# Patient Record
Sex: Male | Born: 1960 | Race: White | Hispanic: No | Marital: Married | Smoking: Former smoker
Health system: Southern US, Community
[De-identification: ages and names within clinical notes are randomized; demographics above are authoritative.]

---

## 1994-02-13 HISTORY — PX: VASECTOMY: SHX75

## 1998-12-16 ENCOUNTER — Encounter: Admission: RE | Admit: 1998-12-16 | Discharge: 1998-12-16 | Payer: Self-pay | Admitting: Family Medicine

## 1998-12-16 ENCOUNTER — Encounter: Payer: Self-pay | Admitting: Family Medicine

## 1999-03-12 ENCOUNTER — Emergency Department (HOSPITAL_COMMUNITY): Admission: EM | Admit: 1999-03-12 | Discharge: 1999-03-12 | Payer: Self-pay | Admitting: Internal Medicine

## 2002-06-23 ENCOUNTER — Ambulatory Visit (HOSPITAL_BASED_OUTPATIENT_CLINIC_OR_DEPARTMENT_OTHER): Admission: RE | Admit: 2002-06-23 | Discharge: 2002-06-23 | Payer: Self-pay | Admitting: Otolaryngology

## 2013-02-13 HISTORY — PX: COLONOSCOPY: SHX174

## 2014-02-13 DIAGNOSIS — K5792 Diverticulitis of intestine, part unspecified, without perforation or abscess without bleeding: Secondary | ICD-10-CM

## 2014-02-13 HISTORY — DX: Diverticulitis of intestine, part unspecified, without perforation or abscess without bleeding: K57.92

## 2019-05-23 ENCOUNTER — Other Ambulatory Visit: Payer: Self-pay | Admitting: Family Medicine

## 2019-05-23 DIAGNOSIS — R972 Elevated prostate specific antigen [PSA]: Secondary | ICD-10-CM

## 2019-05-23 DIAGNOSIS — N5082 Scrotal pain: Secondary | ICD-10-CM

## 2019-05-23 DIAGNOSIS — R3911 Hesitancy of micturition: Secondary | ICD-10-CM

## 2019-05-23 NOTE — Progress Notes (Signed)
Several year h/o R groin bulge and testicular enlargement. Scrotum currently firm and round and approximately 10cm w/ continuation of fullness up the spermatic cord. Pt w/ h/o vasectomy in 1998. Pt also w/ urinary hesitancy and incomplete bladder emptying.   Shelly Flatten, MD Family Medicine 05/23/2019, 11:23 AM

## 2019-06-19 ENCOUNTER — Ambulatory Visit
Admission: RE | Admit: 2019-06-19 | Discharge: 2019-06-19 | Disposition: A | Discharge status: Home / Self Care | Payer: BC Managed Care – PPO | Source: Ambulatory Visit | Attending: Family Medicine | Admitting: Family Medicine

## 2019-06-19 ENCOUNTER — Other Ambulatory Visit: Payer: Self-pay

## 2019-06-19 DIAGNOSIS — R3911 Hesitancy of micturition: Secondary | ICD-10-CM

## 2019-06-19 DIAGNOSIS — N5082 Scrotal pain: Secondary | ICD-10-CM

## 2019-06-19 DIAGNOSIS — R972 Elevated prostate specific antigen [PSA]: Secondary | ICD-10-CM

## 2019-06-19 MED ORDER — GADOBENATE DIMEGLUMINE 529 MG/ML IV SOLN: 20.0000 mL | Freq: Once | INTRAVENOUS | Status: DC | PRN

## 2019-06-19 MED ORDER — GADOBENATE DIMEGLUMINE 529 MG/ML IV SOLN
20.0000 mL | Freq: Once | INTRAVENOUS | Status: AC | PRN
  Administered 2019-06-19: 17:00:00 20 mL via INTRAVENOUS

## 2019-07-29 ENCOUNTER — Other Ambulatory Visit: Payer: Self-pay | Admitting: Family Medicine

## 2019-07-29 DIAGNOSIS — Z87891 Personal history of nicotine dependence: Secondary | ICD-10-CM

## 2019-07-29 DIAGNOSIS — Z122 Encounter for screening for malignant neoplasm of respiratory organs: Secondary | ICD-10-CM

## 2019-08-08 ENCOUNTER — Other Ambulatory Visit: Payer: Self-pay | Admitting: Urology

## 2019-08-21 NOTE — Patient Instructions (Addendum)
DUE TO COVID-19 ONLY ONE VISITOR IS ALLOWED TO COME WITH YOU AND STAY IN THE WAITING ROOM ONLY DURING PRE OP AND PROCEDURE DAY OF SURGERY. THE 1 VISITOR MAY VISIT WITH YOU AFTER SURGERY IN YOUR PRIVATE ROOM DURING VISITING HOURS ONLY!  YOU NEED TO HAVE A COVID 19 TEST ON_7/22______ @_9 :25______, THIS TEST MUST BE DONE BEFORE SURGERY, COME  [address], [place] , .  [facility]) ONCE YOUR COVID TEST IS COMPLETED, PLEASE BEGIN THE QUARANTINE INSTRUCTIONS AS OUTLINED IN YOUR HANDOUT.                Timothy Leblanc    Your procedure is scheduled on: 09/08/19   Report to [facility] Main  Entrance   Report to admitting at  9:00 AM     Call this number if you have problems the morning of surgery [phone]    Remember: Do not eat food or drink liquids after Midnight.   BRUSH YOUR TEETH MORNING OF SURGERY AND RINSE YOUR MOUTH OUT, NO CHEWING GUM CANDY OR MINTS.     Take these medicines the morning of surgery with A SIP OF WATER: Tamsulosin                                 You may not have any metal on your body including              piercings  Do not wear jewelry, lotions, powders or deodorant              Men may shave face and neck.   Do not bring valuables to the hospital. [facility] IS NOT             RESPONSIBLE   FOR VALUABLES.  Contacts, dentures or bridgework may not be worn into surgery.       Patients discharged the day of surgery will not be allowed to drive home.   IF YOU ARE HAVING SURGERY AND GOING HOME THE SAME DAY, YOU MUST HAVE AN ADULT TO DRIVE YOU HOME AND BE WITH YOU FOR 24 HOURS.   YOU MAY GO HOME BY TAXI OR UBER OR ORTHERWISE, BUT AN ADULT MUST ACCOMPANY YOU HOME AND STAY WITH YOU FOR 24 HOURS.  Name and phone number of your driver:  Special Instructions: N/A              Please read over the following fact sheets you were given: _____________________________________________________________________              [name] - Preparing for Surgery  Before surgery, you can play an important role.   Because skin is not sterile, your skin needs to be as free of germs as possible.   You can reduce the number of germs on your skin by washing with CHG (chlorahexidine gluconate) soap before surgery.   CHG is an antiseptic cleaner which kills germs and bonds with the skin to continue killing germs even after washing. Please DO NOT use if you have an allergy to CHG or antibacterial soaps.   If your skin becomes reddened/irritated stop using the CHG and inform your nurse when you arrive at Short Stay.   You may shave your face/neck.  Please follow these instructions carefully:  1.  Shower with CHG Soap the night before surgery and the  morning of Surgery.  2.  If you choose to wash  your hair, wash your hair first as usual with your  normal  shampoo.  3.  After you shampoo, rinse your hair and body thoroughly to remove the  shampoo.                                        4.  Use CHG as you would any other liquid soap.  You can apply chg directly  to the skin and wash                       Gently with a scrungie or clean washcloth.  5.  Apply the CHG Soap to your body ONLY FROM THE NECK DOWN.   Do not use on face/ open                           Wound or open sores. Avoid contact with eyes, ears mouth and genitals (private parts).                       Wash face,  Genitals (private parts) with your normal soap.             6.  Wash thoroughly, paying special attention to the area where your surgery  will be performed.  7.  Thoroughly rinse your body with warm water from the neck down.  8.  DO NOT shower/wash with your normal soap after using and rinsing off  the CHG Soap.             9.  Pat yourself dry with a clean towel.            10.  Wear clean pajamas.            11.  Place clean sheets on your bed the night of your first shower and do not  sleep with pets. Day of Surgery : Do not apply any  lotions/deodorants the morning of surgery.  Please wear clean clothes to the hospital/surgery center.  FAILURE TO FOLLOW THESE INSTRUCTIONS MAY RESULT IN THE CANCELLATION OF YOUR SURGERY PATIENT SIGNATURE_________________________________  NURSE SIGNATURE__________________________________  ________________________________________________________________________

## 2019-09-01 ENCOUNTER — Other Ambulatory Visit: Payer: Self-pay

## 2019-09-01 ENCOUNTER — Encounter (HOSPITAL_COMMUNITY): Payer: Self-pay

## 2019-09-01 ENCOUNTER — Encounter (HOSPITAL_COMMUNITY)
Admission: RE | Admit: 2019-09-01 | Discharge: 2019-09-01 | Disposition: A | Discharge status: Home / Self Care | Payer: BC Managed Care – PPO | Source: Ambulatory Visit | Attending: Urology | Admitting: Urology

## 2019-09-01 DIAGNOSIS — Z01812 Encounter for preprocedural laboratory examination: Secondary | ICD-10-CM | POA: Diagnosis not present

## 2019-09-01 LAB — CBC
HCT: 47.2 % (ref 39.0–52.0)
Hemoglobin: 15.9 g/dL (ref 13.0–17.0)
MCH: 29.3 pg (ref 26.0–34.0)
MCHC: 33.7 g/dL (ref 30.0–36.0)
MCV: 87.1 fL (ref 80.0–100.0)
Platelets: 166 10*3/uL (ref 150–400)
RBC: 5.42 MIL/uL (ref 4.22–5.81)
RDW: 13.4 % (ref 11.5–15.5)
WBC: 4.5 10*3/uL (ref 4.0–10.5)
nRBC: 0 % (ref 0.0–0.2)

## 2019-09-01 NOTE — Progress Notes (Signed)
COVID Vaccine Completed:yes Date COVID Vaccine completed April 2021 COVID vaccine manufacturer:  Moderna     PCP - Dr. Teresa Coombs Cardiologist - no  Chest x-ray - no EKG - no Stress Test - no ECHO - no Cardiac Cath - no  Sleep Study - yes- negative findings CPAP - no  Fasting Blood Sugar - NA Checks Blood Sugar _____ times a day  Blood Thinner Instructions:NA Aspirin Instructions: Last Dose:  Anesthesia review:   Patient denies shortness of breath, fever, cough and chest pain at PAT appointment Yes   Patient verbalized understanding of instructions that were given to them at the PAT appointment. Patient was also instructed that they will need to review over the PAT instructions again at home before surgery. Yes  Pt works out regularly and is in good shape

## 2019-09-04 ENCOUNTER — Other Ambulatory Visit (HOSPITAL_COMMUNITY)
Admission: RE | Admit: 2019-09-04 | Discharge: 2019-09-04 | Disposition: A | Discharge status: Home / Self Care | Payer: BC Managed Care – PPO | Source: Ambulatory Visit | Attending: Urology | Admitting: Urology

## 2019-09-04 DIAGNOSIS — Z20822 Contact with and (suspected) exposure to covid-19: Secondary | ICD-10-CM | POA: Insufficient documentation

## 2019-09-04 DIAGNOSIS — Z01812 Encounter for preprocedural laboratory examination: Secondary | ICD-10-CM | POA: Diagnosis present

## 2019-09-04 LAB — SARS CORONAVIRUS 2 (TAT 6-24 HRS): SARS Coronavirus 2: NEGATIVE

## 2019-09-05 NOTE — Anesthesia Preprocedure Evaluation (Addendum)
Anesthesia Evaluation  Patient identified by MRN, date of birth, ID band Patient awake    Reviewed: Allergy & Precautions, NPO status , Patient's Chart, lab work & pertinent test results  History of Anesthesia Complications Negative for: history of anesthetic complications  Airway Mallampati: II  TM Distance: >3 FB Neck ROM: Full    Dental no notable dental hx.    Pulmonary former smoker,    Pulmonary exam normal        Cardiovascular negative cardio ROS Normal cardiovascular exam     Neuro/Psych negative neurological ROS  negative psych ROS   GI/Hepatic negative GI ROS, Neg liver ROS,   Endo/Other  negative endocrine ROS  Renal/GU negative Renal ROS   Right hydrocele    Musculoskeletal negative musculoskeletal ROS (+)   Abdominal   Peds  Hematology negative hematology ROS (+)   Anesthesia Other Findings Day of surgery medications reviewed with patient.  Reproductive/Obstetrics negative OB ROS                            Anesthesia Physical Anesthesia Plan  ASA: II  Anesthesia Plan: General   Post-op Pain Management:    Induction: Intravenous  PONV Risk Score and Plan: 3 and Treatment may vary due to age or medical condition, Ondansetron, Dexamethasone and Midazolam  Airway Management Planned: LMA  Additional Equipment: None  Intra-op Plan:   Post-operative Plan: Extubation in OR  Informed Consent: I have reviewed the patients History and Physical, chart, labs and discussed the procedure including the risks, benefits and alternatives for the proposed anesthesia with the patient or authorized representative who has indicated his/her understanding and acceptance.     Dental advisory given  Plan Discussed with: CRNA  Anesthesia Plan Comments:        Anesthesia Quick Evaluation

## 2019-09-05 NOTE — H&P (Signed)
CC/HPI: Right hydrocele   Timothy Leblanc is a pleasant 59 year old gentleman who is seen today for a right hydrocele primarily. He has noted increased right-sided scrotal swelling gradually over the last 5-6 years. This became more bothersome this year. He does not describe any pain necessarily but finds the bulk of the heaviness of the scrotum to be problematic. Also has now caused issues with his clothes fitting appropriately. He did undergo an MRI of the abdomen and pelvis that confirmed a simple right-sided hydrocele. He denies any history of epididymo-orchitis or trauma.   He also has a history of BPH and lower urinary tract symptoms. He started tamsulosin 0.4 mg approximately 2 months ago and has found this to make a significant difference with his symptoms. IPSS is currently 6 on therapy. He still has urinary frequency and nocturia. Does not have any specific side effects from tamsulosin but does not enjoyed being on medication and wonders if there are alternative options.   Finally, I noted in his records that his PSA was noted to be 5.4 in April. This was rechecked and was noted to be 5.2 with a free percentage of 14.6%. He admittedly had not had a regular routine physical exam in a few years and so does not have a baseline PSA reading. He has no family history of prostate cancer. He has never undergone a prostate biopsy. His primary care physician had ordered an MRI of the prostate on 06/19/2019. This demonstrated a prostate volume of 51 cc. No highly suspicious lesions were noted for clinically significant prostate cancer.     ALLERGIES: None   MEDICATIONS: Tamsulosin Hcl 0.4 mg capsule  Aleve  Cetirizine Hcl     GU PSH: Vasectomy       PSH Notes: Extraction of Wisdom [phone]  Extraction of tooth-2018   NON-GU PSH: None   GU PMH: None     PMH Notes:  1898-02-13 00:00:00 - Note: Normal Routine History And Physical Adult   NON-GU PMH: GERD    FAMILY HISTORY: heart  failure - Father Lung Cancer - Mother    Notes: 2 sons   SOCIAL HISTORY: Marital Status: Married Preferred Language: English; Ethnicity: Not Hispanic Or Latino; Race: White Current Smoking Status: Patient does not smoke anymore. Has not smoked since 07/15/1991. Smoked for 21 years. Smoked 1 pack per day.   Tobacco Use Assessment Completed: Used Tobacco in last 30 days? Drinks 2 drinks per week. Social Drinker.  Drinks 2 caffeinated drinks per day.    REVIEW OF SYSTEMS:    GU Review Male:   Patient reports hard to postpone urination, stream starts and stops, trouble starting your streams, and have to strain to urinate . Patient denies frequent urination, burning/ pain with urination, get up at night to urinate, and leakage of urine.  Gastrointestinal (Lower):   Patient denies diarrhea and constipation.  Gastrointestinal (Upper):   Patient denies nausea and vomiting.  Constitutional:   Patient denies fever, night sweats, weight loss, and fatigue.  Skin:   Patient denies itching and skin rash/ lesion.  Eyes:   Patient denies blurred vision and double vision.  Ears/ Nose/ Throat:   Patient denies sore throat and sinus problems.  Hematologic/Lymphatic:   Patient denies swollen glands and easy bruising.  Cardiovascular:   Patient denies leg swelling and chest pains.  Respiratory:   Patient denies cough and shortness of breath.  Endocrine:   Patient denies excessive thirst.  Musculoskeletal:   Patient denies back pain  and joint pain.  Neurological:   Patient denies headaches and dizziness.  Psychologic:   Patient denies depression and anxiety.   VITAL SIGNS:      08/06/2019 09:03 AM  Weight 190 lb / 86.18 kg  Height 70 in / 177.8 cm  BP 119/75 mmHg  Pulse 59 /min  Temperature 96.9 F / 36.0 C  BMI 27.3 kg/m   GU PHYSICAL EXAMINATION:    Anus and Perineum: No hemorrhoids. No anal stenosis. No rectal fissure, no anal fissure. No edema, no dimple, no perineal tenderness, no anal  tenderness.  Prostate: Prostate about 50 grams. Left lobe normal consistency, right lobe normal consistency. Symmetrical lobes. No prostate nodule. Left lobe no tenderness, right lobe no tenderness.    Notes: He has a large right-sided hydrocele. I cannot easily palpate his right testis. This is not reducible. His left testis is palpably normal and without tenderness or masses.   MULTI-SYSTEM PHYSICAL EXAMINATION:    Constitutional: Well-nourished. No physical deformities. Normally developed. Good grooming.  Neck: Neck symmetrical, not swollen. Normal tracheal position.  Respiratory: No labored breathing, no use of accessory muscles. [place] bilaterally.  Cardiovascular: Normal temperature, normal extremity pulses, no swelling, no varicosities. Regular rate and rhythm.  Lymphatic: No enlargement of neck, axillae, groin.  Skin: No paleness, no jaundice, no cyanosis. No lesion, no ulcer, no rash.  Neurologic / Psychiatric: Oriented to time, oriented to place, oriented to person. No depression, no anxiety, no agitation.  Gastrointestinal: No mass, no tenderness, no rigidity, non obese abdomen.  Eyes: Normal conjunctivae. Normal eyelids.  Ears, Nose, Mouth, and Throat: Left ear no scars, no lesions, no masses. Right ear no scars, no lesions, no masses. Nose no scars, no lesions, no masses. Normal hearing. Normal lips.  Musculoskeletal: Normal gait and station of head and neck.     Complexity of Data:  Lab Test Review:   PSA  Records Review:   Previous Patient Records  X-Ray Review: MRI Prostate GSORAD: Reviewed Films.     PROCEDURES:          Urinalysis Dipstick Dipstick Cont'd  Color: Yellow Bilirubin: Neg mg/dL  Appearance: [place] Ketones: Neg mg/dL  Specific Gravity: 1.610 Blood: Neg ery/uL  pH: 5.5 Protein: Neg mg/dL  Glucose: Neg mg/dL Urobilinogen: 0.2 mg/dL    Nitrites: Neg    Leukocyte Esterase: Neg leu/uL    ASSESSMENT:      ICD-10 Details  1 GU:   Elevated PSA - R97.20   2    BPH w/LUTS - N40.1   3   Weak Urinary Stream - R39.12   4   Hydrocele - N43.0    PLAN:           Schedule Labs: 4 Months - PSA with Reflex    4 Months - Urinalysis  Return Visit/Planned Activity: 4 Months - Office Visit  Return Visit/Planned Activity: Other See Visit Notes             Note: Coni will call to schedule surgery.          Document Letter(s):  Created for Patient: Clinical Summary         Notes:   1. Right hydrocele: He has undergone adequate imaging confirming his right hydrocele. He is significantly bothered by this and would like to proceed with surgical correction. We therefore discussed proceeding with a right hydrocele repair. We reviewed this procedure in detail including the potential risks, complications, and the expected recovery process. He gives informed consent  to proceed in this will be scheduled for the near future.   2. Elevated PSA: We reviewed his situation in detail. We discussed the implications of an elevated PSA. We also discussed this in the context of his prostate size and prostate MRI. He understands that a prostate MRI cannot absolutely rule out the presence of prostate cancer nor clinically significant prostate cancer and that there is a false negative result associated with prostate MRI. However, his PSA density is not particularly elevated and I therefore gave him the options of proceeding with a biopsy vs. following up on this in the fall with the idea that if he develops a concerning trend in his PSA that we should definitely proceed with a biopsy. He feels comfortable with this approach.   3. BPH/LUTS: He does wish to find an alternative to treatment with medication. However, I encouraged him to continue with medication at this time until we have fully evaluated the etiology for his elevated PSA. He understands that there are minimally invasive procedures that could potentially be performed that may obviate the need for ongoing medical therapy.

## 2019-09-08 ENCOUNTER — Ambulatory Visit (HOSPITAL_COMMUNITY): Payer: BC Managed Care – PPO | Admitting: Anesthesiology

## 2019-09-08 ENCOUNTER — Ambulatory Visit (HOSPITAL_COMMUNITY)
Admission: RE | Admit: 2019-09-08 | Discharge: 2019-09-08 | Disposition: A | Discharge status: Home / Self Care | Payer: BC Managed Care – PPO | Attending: Urology | Admitting: Urology

## 2019-09-08 ENCOUNTER — Encounter (HOSPITAL_COMMUNITY)
Admission: RE | Disposition: A | Discharge status: Home / Self Care | Payer: Self-pay | Source: Home / Self Care | Attending: Urology

## 2019-09-08 ENCOUNTER — Encounter (HOSPITAL_COMMUNITY): Payer: Self-pay | Admitting: Urology

## 2019-09-08 DIAGNOSIS — R350 Frequency of micturition: Secondary | ICD-10-CM | POA: Diagnosis not present

## 2019-09-08 DIAGNOSIS — Z87891 Personal history of nicotine dependence: Secondary | ICD-10-CM | POA: Insufficient documentation

## 2019-09-08 DIAGNOSIS — N401 Enlarged prostate with lower urinary tract symptoms: Secondary | ICD-10-CM | POA: Insufficient documentation

## 2019-09-08 DIAGNOSIS — N433 Hydrocele, unspecified: Secondary | ICD-10-CM | POA: Insufficient documentation

## 2019-09-08 DIAGNOSIS — Z79899 Other long term (current) drug therapy: Secondary | ICD-10-CM | POA: Diagnosis not present

## 2019-09-08 DIAGNOSIS — R351 Nocturia: Secondary | ICD-10-CM | POA: Insufficient documentation

## 2019-09-08 HISTORY — PX: HYDROCELE EXCISION: SHX482

## 2019-09-08 SURGERY — HYDROCELECTOMY
Anesthesia: General | Laterality: Right

## 2019-09-08 MED ORDER — ORAL CARE MOUTH RINSE: 15.0000 mL | Freq: Once | OROMUCOSAL | Status: AC

## 2019-09-08 MED ORDER — BUPIVACAINE HCL 0.25 % IJ SOLN
INTRAMUSCULAR | Status: AC
  Filled 2019-09-08: qty 1

## 2019-09-08 MED ORDER — LIDOCAINE 2% (20 MG/ML) 5 ML SYRINGE
INTRAMUSCULAR | Status: DC | PRN
  Administered 2019-09-08: 100 mg via INTRAVENOUS

## 2019-09-08 MED ORDER — FENTANYL CITRATE (PF) 100 MCG/2ML IJ SOLN: 25.0000 ug | INTRAMUSCULAR | Status: DC | PRN

## 2019-09-08 MED ORDER — ONDANSETRON HCL 4 MG/2ML IJ SOLN
INTRAMUSCULAR | Status: DC | PRN
  Administered 2019-09-08: 4 mg via INTRAVENOUS

## 2019-09-08 MED ORDER — DEXAMETHASONE SODIUM PHOSPHATE 10 MG/ML IJ SOLN
INTRAMUSCULAR | Status: DC | PRN
  Administered 2019-09-08: 8 mg via INTRAVENOUS

## 2019-09-08 MED ORDER — PROPOFOL 10 MG/ML IV BOLUS
INTRAVENOUS | Status: DC | PRN
  Administered 2019-09-08: 180 mg via INTRAVENOUS

## 2019-09-08 MED ORDER — PROPOFOL 10 MG/ML IV BOLUS
INTRAVENOUS | Status: AC
  Filled 2019-09-08: qty 20

## 2019-09-08 MED ORDER — GLYCOPYRROLATE 0.2 MG/ML IJ SOLN
0.2000 mg | Freq: Once | INTRAMUSCULAR | Status: AC
  Administered 2019-09-08: 0.2 mg via INTRAVENOUS

## 2019-09-08 MED ORDER — FENTANYL CITRATE (PF) 100 MCG/2ML IJ SOLN
INTRAMUSCULAR | Status: DC | PRN
  Administered 2019-09-08: 100 ug via INTRAVENOUS
  Administered 2019-09-08: 50 ug via INTRAVENOUS

## 2019-09-08 MED ORDER — CHLORHEXIDINE GLUCONATE 0.12 % MT SOLN
15.0000 mL | Freq: Once | OROMUCOSAL | Status: AC
  Administered 2019-09-08: 15 mL via OROMUCOSAL

## 2019-09-08 MED ORDER — DEXAMETHASONE SODIUM PHOSPHATE 10 MG/ML IJ SOLN
INTRAMUSCULAR | Status: AC
  Filled 2019-09-08: qty 1

## 2019-09-08 MED ORDER — BUPIVACAINE HCL (PF) 0.25 % IJ SOLN
INTRAMUSCULAR | Status: DC | PRN
  Administered 2019-09-08: 20 mL

## 2019-09-08 MED ORDER — MIDAZOLAM HCL 2 MG/2ML IJ SOLN
INTRAMUSCULAR | Status: AC
  Filled 2019-09-08: qty 2

## 2019-09-08 MED ORDER — FENTANYL CITRATE (PF) 100 MCG/2ML IJ SOLN
INTRAMUSCULAR | Status: AC
  Filled 2019-09-08: qty 2

## 2019-09-08 MED ORDER — GLYCOPYRROLATE 0.2 MG/ML IJ SOLN
INTRAMUSCULAR | Status: AC
  Filled 2019-09-08: qty 1

## 2019-09-08 MED ORDER — ATROPINE SULFATE 1 MG/ML IJ SOLN
INTRAMUSCULAR | Status: AC
  Filled 2019-09-08: qty 1

## 2019-09-08 MED ORDER — ONDANSETRON HCL 4 MG/2ML IJ SOLN
INTRAMUSCULAR | Status: AC
  Filled 2019-09-08: qty 2

## 2019-09-08 MED ORDER — ACETAMINOPHEN 500 MG PO TABS
1000.0000 mg | ORAL_TABLET | Freq: Once | ORAL | Status: AC
  Administered 2019-09-08: 1000 mg via ORAL
  Filled 2019-09-08: qty 2

## 2019-09-08 MED ORDER — OXYCODONE HCL 5 MG/5ML PO SOLN: 5.0000 mg | Freq: Once | ORAL | Status: DC | PRN

## 2019-09-08 MED ORDER — TRAMADOL HCL 50 MG PO TABS: 50.0000 mg | ORAL_TABLET | Freq: Four times a day (QID) | ORAL | 0 refills | Status: DC | PRN

## 2019-09-08 MED ORDER — LACTATED RINGERS IV SOLN: INTRAVENOUS | Status: DC

## 2019-09-08 MED ORDER — PHENYLEPHRINE 40 MCG/ML (10ML) SYRINGE FOR IV PUSH (FOR BLOOD PRESSURE SUPPORT)
PREFILLED_SYRINGE | INTRAVENOUS | Status: DC | PRN
  Administered 2019-09-08 (×2): 40 ug via INTRAVENOUS
  Administered 2019-09-08: 80 ug via INTRAVENOUS

## 2019-09-08 MED ORDER — MIDAZOLAM HCL 5 MG/5ML IJ SOLN
INTRAMUSCULAR | Status: DC | PRN
  Administered 2019-09-08: 2 mg via INTRAVENOUS

## 2019-09-08 MED ORDER — CEFAZOLIN SODIUM-DEXTROSE 2-4 GM/100ML-% IV SOLN
2.0000 g | Freq: Once | INTRAVENOUS | Status: AC
  Administered 2019-09-08: 2 g via INTRAVENOUS
  Filled 2019-09-08: qty 100

## 2019-09-08 MED ORDER — OXYCODONE HCL 5 MG PO TABS: 5.0000 mg | ORAL_TABLET | Freq: Once | ORAL | Status: DC | PRN

## 2019-09-08 MED ORDER — PROMETHAZINE HCL 25 MG/ML IJ SOLN: 6.2500 mg | INTRAMUSCULAR | Status: DC | PRN

## 2019-09-08 MED ORDER — LIDOCAINE 2% (20 MG/ML) 5 ML SYRINGE
INTRAMUSCULAR | Status: AC
  Filled 2019-09-08: qty 5

## 2019-09-08 MED ORDER — ATROPINE SULFATE 1 MG/ML IJ SOLN
0.5000 mg | Freq: Once | INTRAMUSCULAR | Status: AC
  Administered 2019-09-08: 0.5 mg via INTRAVENOUS

## 2019-09-08 SURGICAL SUPPLY — 25 items
BLADE HEX COATED 2.75 (ELECTRODE) IMPLANT
BNDG GAUZE ELAST 4 BULKY (GAUZE/BANDAGES/DRESSINGS) ×3 IMPLANT
COVER SURGICAL LIGHT HANDLE (MISCELLANEOUS) ×3 IMPLANT
COVER WAND RF STERILE (DRAPES) IMPLANT
DERMABOND ADVANCED (GAUZE/BANDAGES/DRESSINGS) ×2
DERMABOND ADVANCED .7 DNX12 (GAUZE/BANDAGES/DRESSINGS) ×1 IMPLANT
DRAPE LAPAROTOMY T 98X78 PEDS (DRAPES) ×3 IMPLANT
ELECT REM PT RETURN 15FT ADLT (MISCELLANEOUS) ×3 IMPLANT
GLOVE BIOGEL M STRL SZ7.5 (GLOVE) ×3 IMPLANT
GOWN STRL REUS W/TWL LRG LVL3 (GOWN DISPOSABLE) ×6 IMPLANT
KIT BASIN OR (CUSTOM PROCEDURE TRAY) ×3 IMPLANT
KIT TURNOVER KIT A (KITS) IMPLANT
NEEDLE HYPO 22GX1.5 SAFETY (NEEDLE) ×3 IMPLANT
NS IRRIG 1000ML POUR BTL (IV SOLUTION) ×3 IMPLANT
PACK GENERAL/GYN (CUSTOM PROCEDURE TRAY) ×3 IMPLANT
PENCIL SMOKE EVACUATOR (MISCELLANEOUS) IMPLANT
SUPPORT SCROTAL LG STRP (MISCELLANEOUS) IMPLANT
SUPPORTER ATHLETIC LG (MISCELLANEOUS)
SUT CHROMIC 3 0 SH 27 (SUTURE) ×6 IMPLANT
SUT MNCRL AB 4-0 PS2 18 (SUTURE) ×3 IMPLANT
SUT VIC AB 2-0 UR5 27 (SUTURE) IMPLANT
SUT VICRYL 0 TIES 12 18 (SUTURE) IMPLANT
SYR CONTROL 10ML LL (SYRINGE) ×3 IMPLANT
TOWEL OR 17X26 10 PK STRL BLUE (TOWEL DISPOSABLE) ×3 IMPLANT
WATER STERILE IRR 1000ML POUR (IV SOLUTION) ×3 IMPLANT

## 2019-09-08 NOTE — Anesthesia Procedure Notes (Signed)
Procedure Name: LMA Insertion Date/Time: 09/08/2019 11:28 AM Performed by: Epimenio Sarin, CRNA Pre-anesthesia Checklist: Patient identified, Emergency Drugs available, Suction available, Patient being monitored and Timeout performed Patient Re-evaluated:Patient Re-evaluated prior to induction Oxygen Delivery Method: Circle system utilized Preoxygenation: Pre-oxygenation with 100% oxygen Induction Type: IV induction LMA: LMA with gastric port inserted LMA Size: 4.0 Number of attempts: 1 Dental Injury: Teeth and Oropharynx as per pre-operative assessment

## 2019-09-08 NOTE — Anesthesia Postprocedure Evaluation (Signed)
Anesthesia Post Note  Patient: Timothy Leblanc  Procedure(s) Performed: HYDROCELE REPAIR (Right )     Patient location during evaluation: PACU Anesthesia Type: General Level of consciousness: awake and alert and oriented Pain management: pain level controlled Vital Signs Assessment: post-procedure vital signs reviewed and stable Respiratory status: spontaneous breathing, nonlabored ventilation and respiratory function stable Cardiovascular status: blood pressure returned to baseline Postop Assessment: no apparent nausea or vomiting Anesthetic complications: no Comments: Patient with symptomatic bradycardia in PACU (see intraop record for further details). Likely vagal response to nausea/pain. Glycopyrrolate and atropine given with improvement to baseline. Stephannie Peters, MD   No complications documented.  Last Vitals:  Vitals:   09/08/19 1325 09/08/19 1330  BP: (!) 122/87 123/80  Pulse: 77 78  Resp: 16 12  Temp:  36.5 C  SpO2: 94% 93%    Last Pain:  Vitals:   09/08/19 1330  TempSrc:   PainSc: 3                  Kaylyn Layer

## 2019-09-08 NOTE — Discharge Instructions (Signed)
1. Keep fluff dressing in place for 24 hours and can then remove. 2. Use an ice pack to the scrotum off and on for the first 24-48 hrs to minimize swelling. 3. Call if fever > 101 or pain not controlled with medication 4. Expect some swelling but if more extensive that what was described by Dr. Laverle Patter, call to discuss. 5. You may shower in 2 days. 6. Avoid heavy lifting and strenuous activity for the next few weeks as discussed.  Try to minimize activity on your feet for the first week to a minimum amount.

## 2019-09-08 NOTE — Op Note (Signed)
Preoperative diagnosis: Right hydrocele  Postoperative diagnosis: Right hydrocele  Procedure: Right hydrocelectomy  Surgeon: Moody Bruins MD  Anesthesia: General  Complications: None  EBL: Minimal  Specimen: Hydrocele sac  Disposition of specimen: Pathology  Indication: [name] is a 59 year old gentleman with a symptomatic right-sided hydrocele.  After reviewing options for management, he elected to undergo surgical repair.  The potential risks, complications, and the expected recovery process were discussed in detail.  Informed consent was obtained.  Description of procedure: The patient was taken to the operating room and a general anesthetic was administered.  He was given preoperative antibiotics, placed in the supine position, and prepped and draped in usual sterile fashion.  A preoperative timeout was performed.  An incision was made in the median scrotal raphae and carried down through the dartos fascia.  Once the dartos fascia was sufficiently divided with sharp dissection, it  allowed the right testis to be delivered into the operative field.  The tunica vaginalis was then opened with approximately 300 cc of Both fluid removed.  The tunica vaginalis was then opened completely and the underlying testis and epididymis were examined and appeared normal in appearance.  The excess tunica vaginalis was removed with electrocautery and sent as a surgical specimen.  Hemostasis was meticulously achieved using electrocautery.  In a Jaboulay fashion, the tunica vaginalis was then brought around the opposite side of the testis and sewn to itself with a running 3-0 chromic suture thereby obliterating this space.  Again, hemostasis was ensured and the testis was placed back into the scrotum in its normal anatomic position.  The dartos fascia was then closed with a running 3-0 chromic suture.  Quarter percent bupivacaine was then injected for local anesthesia.  The skin was reapproximated  with a running horizontal mattress 4-0 Monocryl suture.  Dermabond was applied to the skin.  A fluff dressing was placed.  The patient tolerated the procedure well without complications.  He was able to be awakened and transferred to the recovery unit in satisfactory condition.

## 2019-09-08 NOTE — Transfer of Care (Signed)
Immediate Anesthesia Transfer of Care Note  Patient: Timothy Leblanc  Procedure(s) Performed: HYDROCELE REPAIR (Right )  Patient Location: PACU  Anesthesia Type:General  Level of Consciousness: drowsy and patient cooperative  Airway & Oxygen Therapy: Patient Spontanous Breathing and Patient connected to face mask oxygen  Post-op Assessment: Report given to RN and Post -op Vital signs reviewed and stable  Post vital signs: Reviewed and stable  Last Vitals:  Vitals Value Taken Time  BP 132/90 09/08/19 1231  Temp    Pulse 65 09/08/19 1232  Resp    SpO2 100 % 09/08/19 1232  Vitals shown include unvalidated device data.  Last Pain:  Vitals:   09/08/19 0935  TempSrc:   PainSc: 0-No pain         Complications: No complications documented.

## 2019-09-09 ENCOUNTER — Encounter (HOSPITAL_COMMUNITY): Payer: Self-pay | Admitting: Urology

## 2019-09-09 LAB — SURGICAL PATHOLOGY

## 2020-07-16 ENCOUNTER — Other Ambulatory Visit: Payer: Self-pay

## 2020-07-16 ENCOUNTER — Ambulatory Visit: Payer: Self-pay | Attending: Internal Medicine

## 2020-07-16 ENCOUNTER — Other Ambulatory Visit (HOSPITAL_BASED_OUTPATIENT_CLINIC_OR_DEPARTMENT_OTHER): Payer: Self-pay

## 2020-07-16 DIAGNOSIS — Z23 Encounter for immunization: Secondary | ICD-10-CM

## 2020-07-16 MED ORDER — COVID-19 MRNA VACC (MODERNA) 100 MCG/0.5ML IM SUSP
INTRAMUSCULAR | 0 refills | Status: DC
  Filled 2020-07-16: qty 0.25, 1d supply, fill #0

## 2020-07-16 NOTE — Progress Notes (Signed)
   Covid-19 Vaccination Clinic  Name:  ROTH RESS    MRN: [id number] DOB: 1960-07-07  07/16/2020  Mr. Rusconi was observed post Covid-19 immunization for 15 minutes without incident. He was provided with Vaccine Information Sheet and instruction to access the V-Safe system.   Mr. Rosas was instructed to call 911 with any severe reactions post vaccine: Marland Kitchen Difficulty breathing  . Swelling of face and throat  . A fast heartbeat  . A bad rash all over body  . Dizziness and weakness   Immunizations Administered    Name Date Dose VIS Date Route   Moderna Covid-19 Booster Vaccine 07/16/2020  2:40 PM 0.25 mL 12/03/2019 Intramuscular   Manufacturer: Moderna   Lot: [id number]   NDC: 58527-782-42

## 2020-07-23 ENCOUNTER — Other Ambulatory Visit: Payer: Self-pay | Admitting: Family Medicine

## 2020-07-23 DIAGNOSIS — F17210 Nicotine dependence, cigarettes, uncomplicated: Secondary | ICD-10-CM

## 2020-07-23 DIAGNOSIS — Z87891 Personal history of nicotine dependence: Secondary | ICD-10-CM

## 2020-07-23 DIAGNOSIS — J849 Interstitial pulmonary disease, unspecified: Secondary | ICD-10-CM

## 2020-07-23 DIAGNOSIS — Z122 Encounter for screening for malignant neoplasm of respiratory organs: Secondary | ICD-10-CM

## 2020-07-23 DIAGNOSIS — Z1211 Encounter for screening for malignant neoplasm of colon: Secondary | ICD-10-CM

## 2020-08-27 NOTE — Progress Notes (Signed)
Pt w/ h/o >20pack years of smoking. Screening for lung cancer.

## 2020-08-27 NOTE — Addendum Note (Signed)
Addended by: Konrad Dolores, Jubilee Vivero J on: 08/27/2020 04:09 PM   Modules accepted: Orders

## 2020-09-13 ENCOUNTER — Other Ambulatory Visit: Payer: Self-pay

## 2020-09-13 ENCOUNTER — Ambulatory Visit
Admission: RE | Admit: 2020-09-13 | Discharge: 2020-09-13 | Disposition: A | Discharge status: Home / Self Care | Payer: 59 | Source: Ambulatory Visit | Attending: Family Medicine | Admitting: Family Medicine

## 2020-09-13 DIAGNOSIS — J849 Interstitial pulmonary disease, unspecified: Secondary | ICD-10-CM

## 2020-11-12 ENCOUNTER — Ambulatory Visit: Payer: 59

## 2021-06-30 ENCOUNTER — Emergency Department (HOSPITAL_BASED_OUTPATIENT_CLINIC_OR_DEPARTMENT_OTHER): Payer: 59

## 2021-06-30 ENCOUNTER — Encounter (HOSPITAL_BASED_OUTPATIENT_CLINIC_OR_DEPARTMENT_OTHER): Payer: Self-pay

## 2021-06-30 ENCOUNTER — Other Ambulatory Visit: Payer: Self-pay

## 2021-06-30 ENCOUNTER — Inpatient Hospital Stay (HOSPITAL_BASED_OUTPATIENT_CLINIC_OR_DEPARTMENT_OTHER)
Admission: EM | Admit: 2021-06-30 | Discharge: 2021-07-02 | DRG: 064 | Disposition: A | Discharge status: Home / Self Care | Payer: 59 | Attending: Internal Medicine | Admitting: Internal Medicine

## 2021-06-30 DIAGNOSIS — Z87891 Personal history of nicotine dependence: Secondary | ICD-10-CM | POA: Diagnosis not present

## 2021-06-30 DIAGNOSIS — R531 Weakness: Secondary | ICD-10-CM | POA: Diagnosis present

## 2021-06-30 DIAGNOSIS — Z20822 Contact with and (suspected) exposure to covid-19: Secondary | ICD-10-CM | POA: Diagnosis present

## 2021-06-30 DIAGNOSIS — E785 Hyperlipidemia, unspecified: Secondary | ICD-10-CM | POA: Diagnosis present

## 2021-06-30 DIAGNOSIS — I6389 Other cerebral infarction: Secondary | ICD-10-CM | POA: Diagnosis not present

## 2021-06-30 DIAGNOSIS — R2990 Unspecified symptoms and signs involving the nervous system: Secondary | ICD-10-CM

## 2021-06-30 DIAGNOSIS — I63232 Cerebral infarction due to unspecified occlusion or stenosis of left carotid arteries: Secondary | ICD-10-CM | POA: Diagnosis not present

## 2021-06-30 DIAGNOSIS — I7771 Dissection of carotid artery: Secondary | ICD-10-CM | POA: Diagnosis present

## 2021-06-30 DIAGNOSIS — I6522 Occlusion and stenosis of left carotid artery: Secondary | ICD-10-CM | POA: Diagnosis present

## 2021-06-30 DIAGNOSIS — I639 Cerebral infarction, unspecified: Secondary | ICD-10-CM | POA: Diagnosis present

## 2021-06-30 DIAGNOSIS — R42 Dizziness and giddiness: Principal | ICD-10-CM

## 2021-06-30 DIAGNOSIS — Z79899 Other long term (current) drug therapy: Secondary | ICD-10-CM

## 2021-06-30 DIAGNOSIS — I63512 Cerebral infarction due to unspecified occlusion or stenosis of left middle cerebral artery: Secondary | ICD-10-CM | POA: Diagnosis present

## 2021-06-30 DIAGNOSIS — R32 Unspecified urinary incontinence: Secondary | ICD-10-CM | POA: Diagnosis present

## 2021-06-30 DIAGNOSIS — I671 Cerebral aneurysm, nonruptured: Secondary | ICD-10-CM | POA: Diagnosis present

## 2021-06-30 DIAGNOSIS — I959 Hypotension, unspecified: Secondary | ICD-10-CM | POA: Diagnosis present

## 2021-06-30 DIAGNOSIS — R29701 NIHSS score 1: Secondary | ICD-10-CM | POA: Diagnosis present

## 2021-06-30 LAB — URINALYSIS, ROUTINE W REFLEX MICROSCOPIC
Bilirubin Urine: NEGATIVE
Glucose, UA: NEGATIVE mg/dL
Hgb urine dipstick: NEGATIVE
Ketones, ur: NEGATIVE mg/dL
Leukocytes,Ua: NEGATIVE
Nitrite: NEGATIVE
Protein, ur: NEGATIVE mg/dL
Specific Gravity, Urine: 1.01 (ref 1.005–1.030)
pH: 6 (ref 5.0–8.0)

## 2021-06-30 LAB — CBC
HCT: 48.1 % (ref 39.0–52.0)
Hemoglobin: 16.8 g/dL (ref 13.0–17.0)
MCH: 29.5 pg (ref 26.0–34.0)
MCHC: 34.9 g/dL (ref 30.0–36.0)
MCV: 84.5 fL (ref 80.0–100.0)
Platelets: 161 10*3/uL (ref 150–400)
RBC: 5.69 MIL/uL (ref 4.22–5.81)
RDW: 13.3 % (ref 11.5–15.5)
WBC: 5.6 10*3/uL (ref 4.0–10.5)
nRBC: 0 % (ref 0.0–0.2)

## 2021-06-30 LAB — COMPREHENSIVE METABOLIC PANEL
ALT: 34 U/L (ref 0–44)
AST: 25 U/L (ref 15–41)
Albumin: 4.8 g/dL (ref 3.5–5.0)
Alkaline Phosphatase: 82 U/L (ref 38–126)
Anion gap: 9 (ref 5–15)
BUN: 13 mg/dL (ref 6–20)
CO2: 23 mmol/L (ref 22–32)
Calcium: 9.7 mg/dL (ref 8.9–10.3)
Chloride: 104 mmol/L (ref 98–111)
Creatinine, Ser: 0.95 mg/dL (ref 0.61–1.24)
GFR, Estimated: >60 mL/min (ref >60)
Glucose, Bld: 105 mg/dL — ABNORMAL HIGH (ref 70–99)
Potassium: 4.2 mmol/L (ref 3.5–5.1)
Sodium: 136 mmol/L (ref 135–145)
Total Bilirubin: 1.4 mg/dL — ABNORMAL HIGH (ref 0.3–1.2)
Total Protein: 7.9 g/dL (ref 6.5–8.1)

## 2021-06-30 LAB — MRSA NEXT GEN BY PCR, NASAL: MRSA by PCR Next Gen: NOT DETECTED

## 2021-06-30 LAB — APTT: aPTT: 25 seconds (ref 24–36)

## 2021-06-30 LAB — RAPID URINE DRUG SCREEN, HOSP PERFORMED
Amphetamines: NOT DETECTED
Barbiturates: NOT DETECTED
Benzodiazepines: NOT DETECTED
Cocaine: NOT DETECTED
Opiates: NOT DETECTED
Tetrahydrocannabinol: NOT DETECTED

## 2021-06-30 LAB — RESP PANEL BY RT-PCR (FLU A&B, COVID) ARPGX2
Influenza A by PCR: NEGATIVE
Influenza B by PCR: NEGATIVE
SARS Coronavirus 2 by RT PCR: NEGATIVE

## 2021-06-30 LAB — DIFFERENTIAL
Abs Immature Granulocytes: 0.02 10*3/uL (ref 0.00–0.07)
Basophils Absolute: 0 10*3/uL (ref 0.0–0.1)
Basophils Relative: 1 %
Eosinophils Absolute: 0 10*3/uL (ref 0.0–0.5)
Eosinophils Relative: 1 %
Immature Granulocytes: 0 %
Lymphocytes Relative: 19 %
Lymphs Abs: 1.1 10*3/uL (ref 0.7–4.0)
Monocytes Absolute: 0.5 10*3/uL (ref 0.1–1.0)
Monocytes Relative: 8 %
Neutro Abs: 4 10*3/uL (ref 1.7–7.7)
Neutrophils Relative %: 71 %

## 2021-06-30 LAB — ETHANOL: Alcohol, Ethyl (B): <10 mg/dL (ref <10)

## 2021-06-30 LAB — CBG MONITORING, ED: Glucose-Capillary: 95 mg/dL (ref 70–99)

## 2021-06-30 LAB — PROTIME-INR
INR: 1 (ref 0.8–1.2)
Prothrombin Time: 13.1 seconds (ref 11.4–15.2)

## 2021-06-30 MED ORDER — ACETAMINOPHEN 500 MG PO TABS
1000.0000 mg | ORAL_TABLET | Freq: Once | ORAL | Status: AC
  Administered 2021-06-30: 1000 mg via ORAL
  Filled 2021-06-30: qty 2

## 2021-06-30 MED ORDER — CLOPIDOGREL BISULFATE 75 MG PO TABS
75.0000 mg | ORAL_TABLET | Freq: Every day | ORAL | Status: DC
Start: 2021-07-01 — End: 2021-07-02
  Administered 2021-07-01 – 2021-07-02 (×2): 75 mg via ORAL
  Filled 2021-06-30 (×2): qty 1

## 2021-06-30 MED ORDER — ACETAMINOPHEN 650 MG RE SUPP: 650.0000 mg | RECTAL | Status: DC | PRN

## 2021-06-30 MED ORDER — ACETAMINOPHEN 325 MG PO TABS
650.0000 mg | ORAL_TABLET | ORAL | Status: DC | PRN
Start: 2021-06-30 — End: 2021-07-02
  Administered 2021-06-30 – 2021-07-01 (×3): 650 mg via ORAL
  Filled 2021-06-30 (×4): qty 2

## 2021-06-30 MED ORDER — STROKE: EARLY STAGES OF RECOVERY BOOK
Freq: Once | Status: AC
  Administered 2021-06-30: 1
  Filled 2021-06-30: qty 1

## 2021-06-30 MED ORDER — ASPIRIN 325 MG PO TBEC
325.0000 mg | DELAYED_RELEASE_TABLET | Freq: Once | ORAL | Status: AC
  Administered 2021-06-30: 325 mg via ORAL
  Filled 2021-06-30: qty 1

## 2021-06-30 MED ORDER — CHLORHEXIDINE GLUCONATE CLOTH 2 % EX PADS
6.0000 | MEDICATED_PAD | Freq: Every day | CUTANEOUS | Status: DC
  Administered 2021-06-30 – 2021-07-01 (×2): 6 via TOPICAL
  Filled 2021-06-30: qty 6

## 2021-06-30 MED ORDER — IOHEXOL 350 MG/ML SOLN
75.0000 mL | Freq: Once | INTRAVENOUS | Status: AC | PRN
  Administered 2021-06-30: 75 mL via INTRAVENOUS

## 2021-06-30 MED ORDER — SODIUM CHLORIDE 0.9 % IV SOLN: INTRAVENOUS | Status: AC

## 2021-06-30 MED ORDER — ACETAMINOPHEN 160 MG/5ML PO SOLN: 650.0000 mg | ORAL | Status: DC | PRN

## 2021-06-30 MED ORDER — CLOPIDOGREL BISULFATE 300 MG PO TABS
300.0000 mg | ORAL_TABLET | Freq: Once | ORAL | Status: AC
  Administered 2021-06-30: 300 mg via ORAL
  Filled 2021-06-30: qty 1

## 2021-06-30 MED ORDER — SENNOSIDES-DOCUSATE SODIUM 8.6-50 MG PO TABS: 1.0000 | ORAL_TABLET | Freq: Every evening | ORAL | Status: DC | PRN

## 2021-06-30 NOTE — H&P (Addendum)
NEUROLOGY H&P   CC: Transient dizziness and word finding difficulty along with confusion  History is obtained from: Patient, chart  HPI: Timothy Leblanc is a 61 y.o. male no past medical history presenting to the emergency room at [facility] with transient episode of dizziness last known well 9:30 AM today. 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  Noticed generalized weakness.  No focal symptoms reported.  Symptoms lasted about 20 minutes.  Also had some incontinence but never lost consciousness. Seen by telestroke-Dr. Leonel Ramsay. Symptoms completely resolved hence not a candidate for IV thrombolysis. CT angio head and neck completed with severe stenosis versus occlusion of the inferior division left M2.  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 Accepted for admission to neurology service for close observation given the above findings and potential need for acute intervention should symptoms worsen. During transport, [facility] reports an episode of staring that lasted a few minutes with no seizure activity noted.  Dr. Leonel Ramsay is spoken with interventional neuroradiology-plan for diagnostic angio tomorrow.  LKW: 9:30 AM tpa given?: no, too mild to treat-decision made by telestroke evaluation Premorbid modified Rankin scale (mRS): 0   ROS: Full ROS was performed and is negative except as noted in the HPI.  Past Medical History:  Diagnosis Date   Diverticulitis 2016   History reviewed. No pertinent family history.  Social History:   reports that he quit smoking about 24 years ago. His smoking use included cigarettes. He has a 15.00 pack-year smoking history. He has never used smokeless tobacco. He reports current alcohol use of about 2.0 standard drinks per week. He reports current drug use. Frequency: 1.00 time per  week. Drug: Marijuana.  Medications  Current Facility-Administered Medications:     stroke: early stages of recovery book, , Does not apply, Once, Amie Portland, MD   0.9 %  sodium chloride infusion, , Intravenous, Continuous, Amie Portland, MD   acetaminophen (TYLENOL) tablet 650 mg, 650 mg, Oral, Q4H PRN **OR** acetaminophen (TYLENOL) 160 MG/5ML solution 650 mg, 650 mg, Per Tube, Q4H PRN **OR** acetaminophen (TYLENOL) suppository 650 mg, 650 mg, Rectal, Q4H PRN, Amie Portland, MD   [START ON 07/01/2021] Chlorhexidine Gluconate Cloth 2 % PADS 6 each, 6 each, Topical, Q0600, Greta Doom, MD, 6 each at 06/30/21 2100   [COMPLETED] clopidogrel (PLAVIX) tablet 300 mg, 300 mg, Oral, Once, 300 mg at 06/30/21 1446 **AND** [START ON 07/01/2021] clopidogrel (PLAVIX) tablet 75 mg, 75 mg, Oral, Daily, Leonel Ramsay, Vida Roller, MD   senna-docusate (Senokot-S) tablet 1 tablet, 1 tablet, Oral, QHS PRN, Amie Portland, MD   Exam: Current vital signs: BP 121/83   Pulse 60   Temp 98 F (36.7 C) (Oral)   Resp 17   Ht 5\' 10"  (1.778 m)   Wt 89.4 kg   SpO2 98%   BMI 28.28 kg/m  Vital signs in last 24 hours: Temp:  [97.7 F (36.5 C)-98 F (36.7 C)] 98 F (36.7 C) (05/18 2050) Pulse Rate:  [50-83] 60 (05/18 2100) Resp:  [10-22] 17 (05/18 2100) BP: (111-155)/(77-103) 121/83 (05/18 2100) SpO2:  [95 %-100 %] 98 % (05/18 2100) Weight:  [89.4 kg] 89.4 kg (05/18 1354)  GENERAL: Awake, alert in NAD HEENT: - Normocephalic and atraumatic, dry mm, no LN++, no Thyromegally LUNGS - Papesh to auscultation bilaterally with no wheezes CV - S1S2 RRR, no m/r/g, equal  pulses bilaterally. ABDOMEN - Soft, nontender, nondistended with normoactive BS Ext: warm, well perfused, intact peripheral pulses, no edema  NEURO:  Mental Status: AA&Ox3  Language: Speech is Sherlin with no dysarthria but he has mild word finding difficulty with long sentences Cranial Nerves: 2-12 intact Motor: No drift Tone: is normal and  bulk is normal Sensation- Intact to light touch bilaterally Coordination: FTN intact bilaterally, no ataxia in BLE. Gait- deferred  NIHSS-1 for aphasia   Labs I have reviewed labs in epic and the results pertinent to this consultation are:   CBC    Component Value Date/Time   WBC 5.6 06/30/2021 1205   RBC 5.69 06/30/2021 1205   HGB 16.8 06/30/2021 1205   HCT 48.1 06/30/2021 1205   PLT 161 06/30/2021 1205   MCV 84.5 06/30/2021 1205   MCH 29.5 06/30/2021 1205   MCHC 34.9 06/30/2021 1205   RDW 13.3 06/30/2021 1205   LYMPHSABS 1.1 06/30/2021 1205   MONOABS 0.5 06/30/2021 1205   EOSABS 0.0 06/30/2021 1205   BASOSABS 0.0 06/30/2021 1205    CMP     Component Value Date/Time   NA 136 06/30/2021 1205   K 4.2 06/30/2021 1205   CL 104 06/30/2021 1205   CO2 23 06/30/2021 1205   GLUCOSE 105 (H) 06/30/2021 1205   BUN 13 06/30/2021 1205   CREATININE 0.95 06/30/2021 1205   CALCIUM 9.7 06/30/2021 1205   PROT 7.9 06/30/2021 1205   ALBUMIN 4.8 06/30/2021 1205   AST 25 06/30/2021 1205   ALT 34 06/30/2021 1205   ALKPHOS 82 06/30/2021 1205   BILITOT 1.4 (H) 06/30/2021 1205   GFRNONAA >60 06/30/2021 1205    Imaging I have reviewed the images obtained:  CT-head-possible dense left MCA.  No bleed CTA head and neck: CT angio head and neck completed with severe stenosis versus occlusion of the inferior division left M2.  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  CT angiography concerning for occlusion of the left M2 versus severe stenosis as well as findings concerning of a pseudoaneurysm on the left ICA at the skull base +/- dissection involving the left ICA around that area. Admitted to neurology service for close monitoring as  well as diagnostic cerebral angiogram tomorrow. If symptoms worsen, will need emergent neuro IR involvement. Not a candidate for IV TNKase due to rapidly no improving symptoms but NIH has not been back to baseline at 0 hence last known well remains at 930 this morning.   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  Avoid hypotension. MRI brain without contrast Interventional radiology contacted by telestroke-n.p.o. after midnight for possible angio tomorrow. Loaded with aspirin and Plavix.  Continue aspirin and Plavix daily. High intensity statin 2D echo A1c Lipid panel PT OT Speech therapy Should the symptoms worsen, will need to start CTA/perfusion and discussion with neuro interventional radiology for emergent angio/thrombectomy. Continue close ICU monitoring Stroke team to continue to follow  -- Amie Portland, MD Neurologist [facility] Pager: [phone]   [place] Performed by: Amie Portland, MD Total critical care time: 35 minutes Critical care time was exclusive of separately billable procedures and treating other patients  and/or supervising APPs/Residents/Students Critical care was necessary to treat or prevent imminent or life-threatening deterioration due to left carotid occlusion versus dissection, acute ischemic stroke This patient is critically ill and at significant risk for neurological worsening and/or death and care requires constant monitoring. Critical care was time spent personally by me on the following activities: development of treatment plan with patient and/or surrogate as well as nursing, discussions with consultants, evaluation of patient's response to treatment, examination of patient, obtaining history from patient or surrogate, ordering and  performing treatments and interventions, ordering and review of laboratory studies, ordering and review of radiographic studies, pulse oximetry, re-evaluation of patient's condition, participation in multidisciplinary rounds and medical decision making of high complexity in the care of this patient.

## 2021-06-30 NOTE — Plan of Care (Signed)
  Problem: Education: Goal: Knowledge of disease or condition will improve Outcome: Progressing Goal: Knowledge of secondary prevention will improve (SELECT ALL) Outcome: Progressing Goal: Individualized Educational Video(s) Outcome: Progressing   Problem: Coping: Goal: Will verbalize positive feelings about self Outcome: Progressing   Problem: Health Behavior/Discharge Planning: Goal: Ability to manage health-related needs will improve Outcome: Progressing   Problem: Self-Care: Goal: Ability to communicate needs accurately will improve Outcome: Progressing

## 2021-06-30 NOTE — ED Notes (Signed)
ED Provider at bedside. 

## 2021-06-30 NOTE — Consult Note (Signed)
[facility] Telemedicine Consult   Requesting Provider: Pearline Cables, A Consult Participants: Patient  This consult was provided via telemedicine with 2-way video and audio communication. The patient/family was informed that care would be provided in this way and agreed to receive care in this manner.   Chief Complaint: Transient dizziness  HPI: 61 year old male with no significant past medical history who presents with transient dizziness that occurred around 930 this morning.  He states that he was confused, and noticed that he was drooling.  He estimates that it lasted approximately 15 to 20 minutes.  He did have incontinence, but never lost consciousness.    LKW: 9:30 AM tpa given?: No, mild symptoms IR Thrombectomy? No, mild symptoms Modified Rankin Scale: 0-Completely asymptomatic and back to baseline post- stroke Time of teleneurologist evaluation: 12:34  Exam: Vitals:   06/30/21 1203 06/30/21 1215  BP: (!) 142/94 (!) 127/91  Pulse: 79 64  Resp: 20 18  Temp:    SpO2: 97% 97%   Medications: Flomax Allergy  General: in bed, NAD  1A: Level of Consciousness - 0 1B: Ask Month and Age - 0 1C: 'Blink Eyes' & 'Squeeze Hands' - 0 2: Test Horizontal Extraocular Movements - 0 3: Test Visual Fields - 0 4: Test Facial Palsy - 1 5A: Test Left Arm Motor Drift - 0 5B: Test Right Arm Motor Drift - 0 6A: Test Left Leg Motor Drift - 0 6B: Test Right Leg Motor Drift - 0 7: Test Limb Ataxia - 0 8: Test Sensation - 0 9: Test Language/Aphasia- 0 10: Test Dysarthria - 0 11: Test Extinction/Inattention - 0 NIHSS score: 1   Imaging Reviewed: CT head-negative for hemorrhage  Labs reviewed in epic and pertinent values follow: CBG 95 CBC-unremarkable  Assessment: 61 year old male with transient dizziness this morning.  He still seems to have a lag on his right face, and still reports mild difficulty with speech though this is not appreciable on exam.  Given his symptoms are  mild enough, I would not favor thrombolytics or thrombectomy at this time.  If he does have a large vessel occlusion on CT angiogram, then I would favor observation in the ICU as opposed to floor admission.  Recommendations:  - HgbA1c, fasting lipid panel - MRI of the brain without contrast - Frequent neuro checks - Echocardiogram - CTA head and neck - Prophylactic therapy-Antiplatelet med: Aspirin - dose 81mg  and plavix 75mg  daily  after 300mg  load  - Risk factor modification - Telemetry monitoring - PT consult, OT consult, Speech consult -Permissive hypertension to 220/120 - Stroke team to follow   Roland Rack, MD [facility] [phone]  If 7pm- 7am, please page neurology on call as listed in [place].

## 2021-06-30 NOTE — ED Notes (Signed)
Patient transported to Imaging at this Time. 

## 2021-06-30 NOTE — ED Notes (Signed)
Care Handoff/Report given to 4N ICU RN at [facility]. All Questions Answered.

## 2021-06-30 NOTE — ED Triage Notes (Addendum)
Patient arrives with wife POV c/o dizziness and confusion that occurred around 9am this morning. Pt reports feeling off balance and falling; pt states he did not hit his head and just fell back against the wall and in seat; pt also reports being "confused" for approx 20 mins and "was drooling and wet himself."  Pt states he has no hx of CVA. Pt's wife states patient is back to his baseline except "that he is having trouble finding words." Pt states he "felt like he was drunk" and reports having 2 oz of scotch last night. Pt a&o x3, no acute distress.

## 2021-06-30 NOTE — ED Notes (Signed)
Patient transported to CT 

## 2021-06-30 NOTE — Progress Notes (Addendum)
V.O., from Dr. Wilford Corner "to have pt's SBP greater than 140", no intervention unless stays 110s. Read back and repeated.

## 2021-06-30 NOTE — ED Notes (Signed)
Room Assignment changed. Patient and Family Member made aware of Situation. [facility] made aware at the Bedside.

## 2021-06-30 NOTE — Consult Note (Signed)
1226 Stroke cart activated 1232 page to [facility] neuro 1236 Dr Leonel Ramsay joins cart 1251 Leonel Ramsay advises TSRN not needed.  Pt will return for advanced imaging with no additional need for TS services.   Modified Rankin 0

## 2021-06-30 NOTE — ED Notes (Signed)
Care Handoff/Report given to [facility] at this Time. All Questions Answered. 

## 2021-06-30 NOTE — ED Notes (Signed)
[facility] at the Bedside. 

## 2021-06-30 NOTE — ED Provider Notes (Signed)
[place] EMERGENCY DEPARTMENT Provider Note   CSN: [id number] Arrival date & time: 06/30/21  1133     History  Chief Complaint  Patient presents with   Dizziness    Timothy Leblanc is a 61 y.o. male.  Patient is a 61 year old male with no significant past medical history presenting for complaints of strokelike symptoms.  Patient states approximately 9 AM this morning he was attempting to grab coffee when he dropped his coffee cup, felt profound dizziness, stating " felt like I was drunk".  Symptoms lasted accidentally 20 minutes prior to resolving.  Denies blood thinner use.  No recent falls or head trauma.  No previous history of CVA or TIAs.  States he has residual feelings of occultly with word finding and slowness.  Admits to headache currently after symptom onset.  Sensation or motor dysfunction at this time.   The history is provided by the patient. No language interpreter was used.  Dizziness Associated symptoms: headaches   Associated symptoms: no chest pain, no palpitations, no shortness of breath and no vomiting       Home Medications Prior to Admission medications   Medication Sig Start Date End Date Taking? Authorizing Provider  tamsulosin (FLOMAX) 0.4 MG CAPS capsule Take 0.4 mg by mouth daily. 07/02/19  Yes [provider]  cetirizine (ZYRTEC) 10 MG tablet Take 10 mg by mouth daily.    [provider]  COVID-19 mRNA vaccine, Moderna, 100 MCG/0.5ML injection Inject into the muscle. 07/16/20   Carlyle Basques, MD  traMADol (ULTRAM) 50 MG tablet Take 1-2 tablets (50-100 mg total) by mouth every 6 (six) hours as needed (pain). 09/08/19   Raynelle Bring, MD      Allergies    Patient has no known allergies.    Review of Systems   Review of Systems  Constitutional:  Negative for chills and fever.  HENT:  Negative for ear pain and sore throat.   Eyes:  Negative for pain and visual disturbance.  Respiratory:  Negative for cough and  shortness of breath.   Cardiovascular:  Negative for chest pain and palpitations.  Gastrointestinal:  Negative for abdominal pain and vomiting.  Genitourinary:  Negative for dysuria and hematuria.  Musculoskeletal:  Negative for arthralgias and back pain.  Skin:  Negative for color change and rash.  Neurological:  Positive for dizziness, speech difficulty and headaches. Negative for seizures and syncope.  All other systems reviewed and are negative.  Physical Exam Updated Vital Signs BP 122/90   Pulse 76   Temp 97.7 F (36.5 C) (Oral)   Resp 16   Ht 5\' 10"  (1.778 m)   Wt 89.4 kg   SpO2 95%   BMI 28.28 kg/m  Physical Exam Vitals and nursing note reviewed.  Constitutional:      General: He is not in acute distress.    Appearance: He is well-developed.  HENT:     Head: Normocephalic and atraumatic.  Eyes:     Conjunctiva/sclera: Conjunctivae normal.  Cardiovascular:     Rate and Rhythm: Normal rate and regular rhythm.     Heart sounds: No murmur heard. Pulmonary:     Effort: Pulmonary effort is normal. No respiratory distress.     Breath sounds: Normal breath sounds.  Abdominal:     Palpations: Abdomen is soft.     Tenderness: There is no abdominal tenderness.  Musculoskeletal:        General: No swelling.     Cervical back: Neck supple.  Skin:    General: Skin is warm and dry.     Capillary Refill: Capillary refill takes less than 2 seconds.  Neurological:     Mental Status: He is alert.  Psychiatric:        Mood and Affect: Mood normal.    ED Results / Procedures / Treatments   Labs (all labs ordered are listed, but only abnormal results are displayed) Labs Reviewed  COMPREHENSIVE METABOLIC PANEL - Abnormal; Notable for the following components:      Result Value   Glucose, Bld 105 (*)    Total Bilirubin 1.4 (*)    All other components within normal limits  RESP PANEL BY RT-PCR (FLU A&B, COVID) ARPGX2  ETHANOL  PROTIME-INR  APTT  CBC  DIFFERENTIAL   RAPID URINE DRUG SCREEN, HOSP PERFORMED  URINALYSIS, ROUTINE W REFLEX MICROSCOPIC  CBG MONITORING, ED    EKG EKG Interpretation  Date/Time:  Thursday Jun 30 2021 11:42:57 EDT Ventricular Rate:  64 PR Interval:  154 QRS Duration: 92 QT Interval:  376 QTC Calculation: 387 R Axis:   -19 Text Interpretation: Normal sinus rhythm Minimal voltage criteria for LVH, may be normal variant ( R in aVL ) Borderline ECG No previous ECGs available Confirmed by Campbell Stall ([id number]) on [id number] 1:37:49 PM  Radiology CT HEAD CODE STROKE WO CONTRAST  Result Date: 06/30/2021 CLINICAL DATA:  Code stroke.  Transient ischemic attack (TIA) EXAM: CT HEAD WITHOUT CONTRAST TECHNIQUE: Contiguous axial images were obtained from the base of the skull through the vertex without intravenous contrast. RADIATION DOSE REDUCTION: This exam was performed according to the departmental dose-optimization program which includes automated exposure control, adjustment of the mA and/or kV according to patient size and/or use of iterative reconstruction technique. COMPARISON:  None Available. FINDINGS: Brain: No evidence of acute large vascular territory infarction, hemorrhage, hydrocephalus, extra-axial collection or mass lesion/mass effect. Vascular: Hyperdense left M2 MCA in the left sylvian fissure (for example, see series 6, images 42/43) Skull: No acute fracture Sinuses/Orbits: No acute fracture.  No acute orbital findings. Other: No mastoid effusions. ASPECTS [facility] Stroke Program Early CT Score) total score (0-10 with 10 being normal): 10. IMPRESSION: 1. Hyperdense left M2 MCA in the left sylvian fissure, concerning for thrombus. Recommend urgent CTA to further evaluate. 2. No evidence of acute large vascular territory infarct or acute hemorrhage by CT. MRI could provide more sensitive evaluation for acute infarct if clinically warranted. Findings and recommendation discussed with Dr. Pearline Cables via telephone at 12:43 p.m.  Electronically Signed   By: Margaretha Sheffield M.D.   On: 06/30/2021 12:47    Procedures .Critical Care Performed by: Lianne Cure, DO Authorized by: Lianne Cure, DO   Critical care provider statement:    Critical care time (minutes):  75   Critical care was necessary to treat or prevent imminent or life-threatening deterioration of the following conditions: acute stroke.   Critical care was time spent personally by me on the following activities:  Development of treatment plan with patient or surrogate, discussions with consultants, evaluation of patient's response to treatment, examination of patient, ordering and review of laboratory studies, ordering and review of radiographic studies, ordering and performing treatments and interventions, pulse oximetry, re-evaluation of patient's condition and review of old charts   Care discussed with: admitting provider     Care discussed with comment:  Neurology    Medications Ordered in ED Medications  clopidogrel (PLAVIX) tablet 300 mg (has no administration in time range)  And  clopidogrel (PLAVIX) tablet 75 mg (has no administration in time range)  aspirin EC tablet 325 mg (has no administration in time range)  iohexol (OMNIPAQUE) 350 MG/ML injection 75 mL (75 mLs Intravenous Contrast Given 06/30/21 1322)    ED Course/ Medical Decision Making/ A&P                           Medical Decision Making Amount and/or Complexity of Data Reviewed Labs: ordered. Radiology: ordered.  Risk OTC drugs. Prescription drug management. Decision regarding hospitalization.   22:75 PM 61 year old male with no significant past medical history presenting for complaints of strokelike symptoms.  Patient is alert and oriented x3, no acute distress, afebrile, stable vital signs.  Physical exam demonstrates no facial asymmetry.  No sensation or motor deficits.  Patient states patient is slow to speech.  Patient states he feels slow to speak and has  difficulty with word finding at this time.  Stroke activated.  NIH stroke scale: 1  Time of onset 9AM this morning.   2:00 PM Radiology called, concerns for hyperdense M2 MCA. CTA ordered. Dr. Leonel Ramsay notified.   Occulusion seen on CTA. Dr. Leonel Ramsay accepts to his service. No thrombectomy at this point due to low NIH. Notify immediately for any changes.          Final Clinical Impression(s) / ED Diagnoses Final diagnoses:  Dizziness  Cerebrovascular accident (CVA), unspecified mechanism [facility])    Rx / DC Orders ED Discharge Orders     None         Lianne Cure, DO [id number]

## 2021-06-30 NOTE — Progress Notes (Addendum)
CTA reveals M2 occlusion, will admit to [facility] neurology for close monitoring. Will need IR consultation for angiogram given ICA findings, spoke with neuroIR and will plan angiogram tomorrow  unless worsening necessitates doing it earlier.   Ritta Slot, MD [facility] [phone]  If 7pm- 7am, please page neurology on call as listed in [place].

## 2021-06-30 NOTE — ED Notes (Signed)
Patient made aware of Inpatient Bed Status and Verbally consents to Transfer.

## 2021-07-01 ENCOUNTER — Inpatient Hospital Stay (HOSPITAL_COMMUNITY): Payer: 59

## 2021-07-01 DIAGNOSIS — I6389 Other cerebral infarction: Secondary | ICD-10-CM

## 2021-07-01 DIAGNOSIS — I959 Hypotension, unspecified: Secondary | ICD-10-CM

## 2021-07-01 DIAGNOSIS — I639 Cerebral infarction, unspecified: Secondary | ICD-10-CM

## 2021-07-01 DIAGNOSIS — I63232 Cerebral infarction due to unspecified occlusion or stenosis of left carotid arteries: Secondary | ICD-10-CM

## 2021-07-01 DIAGNOSIS — I7771 Dissection of carotid artery: Secondary | ICD-10-CM

## 2021-07-01 HISTORY — PX: IR ANGIO INTRA EXTRACRAN SEL COM CAROTID INNOMINATE UNI L MOD SED: IMG5358

## 2021-07-01 HISTORY — PX: IR ANGIO INTRA EXTRACRAN SEL INTERNAL CAROTID UNI R MOD SED: IMG5362

## 2021-07-01 HISTORY — PX: IR 3D INDEPENDENT WKST: IMG2385

## 2021-07-01 HISTORY — PX: IR US GUIDE VASC ACCESS RIGHT: IMG2390

## 2021-07-01 HISTORY — PX: IR ANGIO VERTEBRAL SEL SUBCLAVIAN INNOMINATE UNI L MOD SED: IMG5364

## 2021-07-01 HISTORY — PX: IR ANGIO VERTEBRAL SEL VERTEBRAL UNI R MOD SED: IMG5368

## 2021-07-01 LAB — LIPID PANEL
Cholesterol: 218 mg/dL — ABNORMAL HIGH (ref 0–200)
HDL: 47 mg/dL (ref >40)
LDL Cholesterol: 150 mg/dL — ABNORMAL HIGH (ref 0–99)
Total CHOL/HDL Ratio: 4.6 RATIO
Triglycerides: 107 mg/dL (ref <150)
VLDL: 21 mg/dL (ref 0–40)

## 2021-07-01 LAB — ECHOCARDIOGRAM COMPLETE
AR max vel: 3 cm2
AV Area VTI: 2.89 cm2
AV Area mean vel: 2.94 cm2
AV Mean grad: 3 mmHg
AV Peak grad: 5 mmHg
Ao pk vel: 1.12 m/s
Area-P 1/2: 3.99 cm2
Calc EF: 64.4 %
Height: 70 in
MV VTI: 2.88 cm2
S' Lateral: 2.7 cm
Single Plane A2C EF: 64.2 %
Single Plane A4C EF: 65.4 %
Weight: 3153.46 oz

## 2021-07-01 LAB — HEMOGLOBIN A1C
Hgb A1c MFr Bld: 5.1 % (ref 4.8–5.6)
Mean Plasma Glucose: 99.67 mg/dL

## 2021-07-01 LAB — HIV ANTIBODY (ROUTINE TESTING W REFLEX): HIV Screen 4th Generation wRfx: NONREACTIVE

## 2021-07-01 MED ORDER — IOHEXOL 300 MG/ML  SOLN
100.0000 mL | Freq: Once | INTRAMUSCULAR | Status: AC | PRN
  Administered 2021-07-01: 55 mL via INTRA_ARTERIAL

## 2021-07-01 MED ORDER — SODIUM CHLORIDE 0.9 % IV BOLUS
1000.0000 mL | Freq: Once | INTRAVENOUS | Status: AC
  Administered 2021-07-01: 1000 mL via INTRAVENOUS

## 2021-07-01 MED ORDER — ASPIRIN 325 MG PO TBEC
325.0000 mg | DELAYED_RELEASE_TABLET | Freq: Every day | ORAL | Status: DC
  Administered 2021-07-01 – 2021-07-02 (×2): 325 mg via ORAL
  Filled 2021-07-01 (×2): qty 1

## 2021-07-01 MED ORDER — VERAPAMIL HCL 2.5 MG/ML IV SOLN
INTRAVENOUS | Status: AC
  Filled 2021-07-01: qty 2

## 2021-07-01 MED ORDER — MIDAZOLAM HCL 2 MG/2ML IJ SOLN
INTRAMUSCULAR | Status: AC
  Filled 2021-07-01: qty 2

## 2021-07-01 MED ORDER — NITROGLYCERIN 1 MG/10 ML FOR IR/CATH LAB
INTRA_ARTERIAL | Status: AC
  Filled 2021-07-01: qty 10

## 2021-07-01 MED ORDER — ROSUVASTATIN CALCIUM 20 MG PO TABS
40.0000 mg | ORAL_TABLET | Freq: Every day | ORAL | Status: DC
  Administered 2021-07-01 – 2021-07-02 (×2): 40 mg via ORAL
  Filled 2021-07-01 (×2): qty 2

## 2021-07-01 MED ORDER — FENTANYL CITRATE (PF) 100 MCG/2ML IJ SOLN
INTRAMUSCULAR | Status: AC | PRN
  Administered 2021-07-01: 50 ug via INTRAVENOUS

## 2021-07-01 MED ORDER — NITROGLYCERIN 1 MG/10 ML FOR IR/CATH LAB
INTRA_ARTERIAL | Status: AC | PRN
  Administered 2021-07-01: 6 mL via INTRA_ARTERIAL

## 2021-07-01 MED ORDER — HEPARIN SODIUM (PORCINE) 1000 UNIT/ML IJ SOLN
INTRAMUSCULAR | Status: AC
  Filled 2021-07-01: qty 10

## 2021-07-01 MED ORDER — BUTALBITAL-APAP-CAFFEINE 50-325-40 MG PO TABS
1.0000 | ORAL_TABLET | Freq: Three times a day (TID) | ORAL | Status: DC | PRN
  Administered 2021-07-01 – 2021-07-02 (×2): 1 via ORAL
  Filled 2021-07-01 (×3): qty 1

## 2021-07-01 MED ORDER — TAMSULOSIN HCL 0.4 MG PO CAPS
0.4000 mg | ORAL_CAPSULE | Freq: Every day | ORAL | Status: DC
  Administered 2021-07-01 – 2021-07-02 (×2): 0.4 mg via ORAL
  Filled 2021-07-01 (×2): qty 1

## 2021-07-01 MED ORDER — FENTANYL CITRATE (PF) 100 MCG/2ML IJ SOLN
INTRAMUSCULAR | Status: AC
  Filled 2021-07-01: qty 2

## 2021-07-01 MED ORDER — IOHEXOL 300 MG/ML  SOLN
100.0000 mL | Freq: Once | INTRAMUSCULAR | Status: AC | PRN
  Administered 2021-07-01: 70 mL via INTRA_ARTERIAL

## 2021-07-01 MED ORDER — MIDAZOLAM HCL 2 MG/2ML IJ SOLN
INTRAMUSCULAR | Status: AC | PRN
  Administered 2021-07-01: 1 mg via INTRAVENOUS

## 2021-07-01 MED ORDER — LIDOCAINE HCL 1 % IJ SOLN
INTRAMUSCULAR | Status: AC
  Administered 2021-07-01: 10 mL
  Filled 2021-07-01: qty 20

## 2021-07-01 NOTE — Plan of Care (Signed)
  Problem: Education: Goal: Knowledge of disease or condition will improve Outcome: Progressing Goal: Knowledge of secondary prevention will improve (SELECT ALL) Outcome: Progressing   Problem: Education: Goal: Knowledge of disease or condition will improve 07/01/2021 1811 by Charise Carwin, RN Outcome: Progressing 07/01/2021 0843 by Charise Carwin, RN Outcome: Progressing   Problem: Self-Care: Goal: Ability to participate in self-care as condition permits will improve Outcome: Progressing Goal: Verbalization of feelings and concerns over difficulty with self-care will improve Outcome: Progressing Goal: Ability to communicate needs accurately will improve Outcome: Progressing   Problem: Ischemic Stroke/TIA Tissue Perfusion: Goal: Complications of ischemic stroke/TIA will be minimized Outcome: Progressing   Problem: Clinical Measurements: Goal: Will remain free from infection Outcome: Progressing   Problem: Coping: Goal: Level of anxiety will decrease Outcome: Progressing   Problem: Education: Goal: Knowledge of secondary prevention will improve (SELECT ALL) Outcome: Completed/Met Goal: Knowledge of patient specific risk factors will improve (INDIVIDUALIZE FOR PATIENT) Outcome: Completed/Met Goal: Individualized Educational Video(s) Outcome: Completed/Met   Problem: Nutrition: Goal: Risk of aspiration will decrease Outcome: Completed/Met   Problem: Health Behavior/Discharge Planning: Goal: Ability to manage health-related needs will improve Outcome: Completed/Met   Problem: Clinical Measurements: Goal: Ability to maintain clinical measurements within normal limits will improve Outcome: Completed/Met   Problem: Activity: Goal: Risk for activity intolerance will decrease Outcome: Completed/Met   Problem: Elimination: Goal: Will not experience complications related to urinary retention Outcome: Completed/Met

## 2021-07-01 NOTE — Progress Notes (Signed)
Remains asymptomatic with NIH 1 BP in 110-120s with MAP >80. I gave him 2 boluses of NS 1L and pressures still in 110-120s. As long he is not symptomatic - I am ok with SBP >/= 120.  -- Milon Dikes, MD Neurologist [facility] Pager: [phone]

## 2021-07-01 NOTE — Progress Notes (Signed)
Pt SBP still below 140 after 1L bolus, pt only has expressive aphasia. Dr. Wilford Corner informed, V.O., "give another 1L NS bolus".

## 2021-07-01 NOTE — Sedation Documentation (Signed)
Pt transported to 4N21 via bed accompanied by RN. Claiborne Billings RN at bedside to receive pt. Handoff complete. Right hand remains level 0 with snuffbox band in place. Pt awake alert and oriented. No s/s of distress.

## 2021-07-01 NOTE — Progress Notes (Signed)
Pt's SBP less than 120 as SBP 120 ok'd by DR. Wilford Corner. Pt asymptomatic.

## 2021-07-01 NOTE — Procedures (Signed)
  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 Most of the blood flow to antr circulation is by the Rt ICA through patent A-comm EBL:    minimal Complications:   none immediate  See full dictation in [employer].  Marjo Bicker, MD Main # [phone] Mobile [phone]

## 2021-07-01 NOTE — Progress Notes (Signed)
Pt with HR in 50s, dropping into the 40s, not sustained. Blood pressure stable, no change in neuro exam. MD Roda Shutters made aware. No new orders at this time.

## 2021-07-01 NOTE — Progress Notes (Signed)
Pt's MRI will probably be next shift. Dr. Wilford Corner informed.

## 2021-07-01 NOTE — Progress Notes (Signed)
SLP Cancellation Note  Patient Details Name: Timothy Leblanc [name] MRN: [id number] DOB: 1960-07-27   Cancelled treatment:       Reason Eval/Treat Not Completed: Patient at procedure or test/unavailable (Pt currently having discussion with MD. SLP will follow up.)  Rondi Ivy I. Vear Clock, MS, CCC-SLP Acute Rehabilitation Services Office number [phone] Pager [phone]  Scheryl Marten 07/01/2021, 12:22 PM

## 2021-07-01 NOTE — Progress Notes (Addendum)
STROKE TEAM PROGRESS NOTE   INTERVAL HISTORY His wife is at the bedside.  MRI shows patchy infarct in L MCA territory. Plan for cerebral angiogram today.  Dizziness lasted for approximately last night. He is having some word finding difficulty that persists.   Vitals:   07/01/21 0600 07/01/21 0615 07/01/21 0630 07/01/21 0700  BP: 118/89 119/82 128/88 107/77  Pulse: (!) 55 (!) 57 61 (!) 49  Resp: (!) 9 12 16 11   Temp:      TempSrc:      SpO2: 96% 95% 96% 96%  Weight:      Height:       CBC:  Recent Labs  Lab 06/30/21 1205  WBC 5.6  NEUTROABS 4.0  HGB 16.8  HCT 48.1  MCV 84.5  PLT 161   Basic Metabolic Panel:  Recent Labs  Lab 06/30/21 1205  NA 136  K 4.2  CL 104  CO2 23  GLUCOSE 105*  BUN 13  CREATININE 0.95  CALCIUM 9.7   Lipid Panel:  Recent Labs  Lab 07/01/21 0104  CHOL 218*  TRIG 107  HDL 47  CHOLHDL 4.6  VLDL 21  LDLCALC 07/03/21*   HgbA1c:  Recent Labs  Lab 07/01/21 0104  HGBA1C 5.1   Urine Drug Screen:  Recent Labs  Lab 06/30/21 1223  LABOPIA NONE DETECTED  COCAINSCRNUR NONE DETECTED  LABBENZ NONE DETECTED  AMPHETMU NONE DETECTED  THCU NONE DETECTED  LABBARB NONE DETECTED    Alcohol Level  Recent Labs  Lab 06/30/21 1236  ETH <10    IMAGING past 24 hours CT HEAD CODE STROKE WO CONTRAST  Result Date: 06/30/2021 CLINICAL DATA:  Code stroke.  Transient ischemic attack (TIA) EXAM: CT HEAD WITHOUT CONTRAST TECHNIQUE: Contiguous axial images were obtained from the base of the skull through the vertex without intravenous contrast. RADIATION DOSE REDUCTION: This exam was performed according to the departmental dose-optimization program which includes automated exposure control, adjustment of the mA and/or kV according to patient size and/or use of iterative reconstruction technique. COMPARISON:  None Available. FINDINGS: Brain: No evidence of acute large vascular territory infarction, hemorrhage, hydrocephalus, extra-axial collection or  mass lesion/mass effect. Vascular: Hyperdense left M2 MCA in the left sylvian fissure (for example, see series 6, images 42/43) Skull: No acute fracture Sinuses/Orbits: No acute fracture.  No acute orbital findings. Other: No mastoid effusions. ASPECTS [facility] Stroke Program Early CT Score) total score (0-10 with 10 being normal): 10. IMPRESSION: 1. Hyperdense left M2 MCA in the left sylvian fissure, concerning for thrombus. Recommend urgent CTA to further evaluate. 2. No evidence of acute large vascular territory infarct or acute hemorrhage by CT. MRI could provide more sensitive evaluation for acute infarct if clinically warranted. Findings and recommendation discussed with Dr. [facility] via telephone at 12:43 p.m. Electronically Signed   By: Wallace Cullens M.D.   On: 06/30/2021 12:47   CT ANGIO HEAD NECK W WO CM (CODE STROKE)  Result Date: 06/30/2021 CLINICAL DATA:  Stroke, follow up EXAM: CT ANGIOGRAPHY HEAD AND NECK TECHNIQUE: Multidetector CT imaging of the head and neck was performed using the standard protocol during bolus administration of intravenous contrast. Multiplanar CT image reconstructions and MIPs were obtained to evaluate the vascular anatomy. Carotid stenosis measurements (when applicable) are obtained utilizing NASCET criteria, using the distal internal carotid diameter as the denominator. RADIATION DOSE REDUCTION: This exam was performed according to the departmental dose-optimization program which includes automated exposure control, adjustment of the mA and/or kV according  to patient size and/or use of iterative reconstruction technique. CONTRAST:  86mL OMNIPAQUE IOHEXOL 350 MG/ML SOLN COMPARISON:  Same day CT head. FINDINGS: CTA NECK FINDINGS Aortic arch: Great vessel origins are patent without significant stenosis. Right carotid system: No evidence of dissection, stenosis (50% or greater) or occlusion. Left carotid system: Left common carotid artery is patent. The left mid ICA is  irregular with severe stenosis at the skull base from and approximately 1.4 x 1.9 cm area of abnormal soft tissue encompassing the ICA, concerning for partially thrombosed pseudoaneurysm and possible dissection. More distal ICA is patent. Vertebral arteries: Codominant. No evidence of dissection, stenosis (50% or greater) or occlusion. Skeleton: Moderate to severe multilevel degenerative disc disease, including disc height loss, endplate sclerosis, and endplate spurring. Other neck: No acute findings. Upper chest: Visualized lung apices are [place]. Review of the MIP images confirms the above findings CTA HEAD FINDINGS Anterior circulation: Bilateral ICAs, M1 MCAs and bilateral ACAs are patent without proximal hemodynamically significant stenosis. Occlusion of left inferior M2 MCA branch with distal reconstitution. Posterior circulation: Bilateral intradural vertebral arteries, basilar artery and both posterior cerebral arteries are patent without proximal hemodynamically significant stenosis. Venous sinuses: No evidence of dural venous sinus thrombosis. Review of the MIP images confirms the above findings IMPRESSION: 1. Occluded inferior left M2 MCA vessel, correlating with hyperdense vessel seen on same day CT head. 2. 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 3. Recommend neurointerventional consultation. Findings discussed with Dr. Amada Jupiter via telephone at 1:39 p.m. Electronically Signed   By: Feliberto Harts M.D.   On: 06/30/2021 13:58    PHYSICAL EXAM  Physical Exam  Constitutional: Appears well-developed and well-nourished.  Cardiovascular: Normal rate and regular rhythm.  Respiratory: Effort normal, non-labored breathing  Neuro: Mental Status: Patient is awake, alert, oriented to person, place, month, year, and situation. Speech is slow with long  sentences, some word finding difficulty Cranial Nerves: II: Visual Fields are full. Pupils are equal, round, and reactive to light.   III,IV, VI: EOMI without ptosis or diploplia.  V: Facial sensation is symmetric to temperature VII: Facial movement is symmetric resting and smiling VIII: Hearing is intact to voice X: Palate elevates symmetrically XI: Shoulder shrug is symmetric. XII: Tongue protrudes midline without atrophy or fasciculations.  Motor: Tone is normal. Bulk is normal. 5/5 strength was present in all four extremities.  Sensory: Sensation is symmetric to light touch and temperature in the arms and legs. No extinction to DSS present.  Cerebellar: FNF and HKS are intact bilaterally Gait is steady, ambulating well   ASSESSMENT/PLAN Timothy Leblanc is a 61 y.o. male with no significant past medical history presenting with dizziness episode lasting approximately 20 minutes. He was initially seen at [facility] and transferred to [facility]. Plan for cerebral angiogram today with Dr. Gretel Acre. Maintain BP greater than 140 for perfusion. 2L NS bolus overnight for soft blood pressures, he is now maintaining blood pressures with systolic 120s.   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 Occluded inferior left M2 MCA vessel, Cerebral angio 1 cm segment of  severe vascular injury seen at the distal left ICA with dissection, pseudoaneurysm, thrombus formation and severe more than 90% stenosis.  Left MCA patent MRI  Patchy acute infarcts in the middle and posterior Left MCA territory. No associated hemorrhage or mass effect. 2D Echo EF 60 to  65% LDL 150 HgbA1c 5.1 UDS negative VTE prophylaxis - SCDs No antithrombotic prior to admission, now on aspirin 325 mg daily and clopidogrel 75 mg daily DAPT for 3 months and then aspirin alone given severe left ICA stenosis .  Repeat CTA in 3 months per interventional radiology Therapy recommendations:  No follow needed Disposition:  Pending  Hypotension 2L NS bolus overnight 5/18 BP maintaining 120 systolic now, consider midodrine if he becomes hypotensive again Avoid low BP  Hyperlipidemia LDL 150, goal < 70 Add Crestor 40mg   Continue statin at discharge  Other Stroke Risk Factors ETOH use, alcohol level <10, advised to drink no more than 2 drink(s) a day Substance abuse -patient admitted for using THC, cessation education provided  Other Active Problems BPH- Home med: flomax 0.4  Hospital day # 1  Patient seen and examined by NP/APP with MD. MD to update note as needed.   Timothy Pickerevon Shafer, DNP, FNP-BC [facility] Pager: [phone]) [phone]  ATTENDING NOTE: I reviewed above note and agree with the assessment and plan. Pt was seen and examined.   61 year old male with history of alcohol use, THC use admitted for dizziness, confusion drooling and general weakness for 20 minutes.  Still has slight aphasia on presentation.  CT no acute abnormality.  CT head and neck left M2 high-grade stenosis versus occlusion.  Questionable left ICA dissection with stenosis at skull base.  MRI showed left MCA patchy infarct.  LDL 150, A1c 5.1.  UDS negative.  EF 60 to 65%.  Creatinine 0.95.  Cerebral angiogram showed left distal ICA dissection with pseudoaneurysm and thrombus formation and severe stenosis more than 90%.  Left MCA supplied by right ICA and patent.    On exam, wife at bedside.  Patient neurologically intact except slight aphasia with word finding difficulty.  Etiology for patient left ICA dissection not quite Haglund, patient denies head or neck trauma.  Per interventional  radiology, recommend medical management and repeat CTA in 3 months.  Will continue aspirin 325 and Plavix 75 DVT for 3 months and then aspirin alone.  Continue Crestor 40.  Avoid low BP, continue IV fluid.  For detailed assessment and plan, please refer to above as I have made changes wherever appropriate.   Marvel PlanJindong Dominie Benedick, MD PhD Stroke Neurology 07/01/2021 7:41 PM  This patient is critically ill due to left ICA dissection, stenosis, left MCA stroke and at significant risk of neurological worsening, death form recurrent stroke, hemorrhagic transformation, seizure. This patient's care requires constant monitoring of vital signs, hemodynamics, respiratory and cardiac monitoring, review of multiple databases, neurological assessment, discussion with family, other specialists and medical decision making of high complexity. I spent 45 minutes of neurocritical care time in the care of this patient. I had long discussion with wife at bedside, updated pt current condition, treatment plan and potential prognosis, and answered all the questions.  She expressed understanding and appreciation.     To contact Stroke Continuity provider, please refer to [email or web address]. After hours, contact General Neurology

## 2021-07-01 NOTE — Progress Notes (Signed)
OT Cancellation Note  Patient Details Name: Timothy Leblanc MRN: [id number] DOB: Aug 22, 1960   Cancelled Treatment:     Attempted to see x 2, pt at procedure. Will attempt another time.  Jackquline Branca,HILLARY 07/01/2021, 2:35 PM Maurie Boettcher, OT/L   Acute OT Clinical Specialist Acute Rehabilitation Services Pager [phone] Office [phone]

## 2021-07-01 NOTE — Evaluation (Signed)
Physical Therapy Evaluation Patient Details Name: Timothy Leblanc MRN: [id number] DOB: 04-01-1960 Today's Date: 07/01/2021  History of Present Illness  61 y.o. male presents to [facility] hospital on 06/30/2021 after transient episode of dizziness, confusion, incontinence and drooling. 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 Pending cerebral angiogram 5/19. No pertinent PMH.  Clinical Impression  Pt presents to PT at his mobility baseline. Pt is mobilizing independently at this time, tolerating multiple dynamic gait challenges and negotiating stairs unassisted. PT does note intermittent difficulty following multi-step commands near the end of session and the patient reports some deficits in expressive communication at this time, but otherwise feels he is at his baseline. Pt has no further acute PT needs and is encouraged to ambulate multiple times daily for the remainder of this admission. Acute PT signing off.       Recommendations for follow up therapy are one component of a multi-disciplinary discharge planning process, led by the attending physician.  Recommendations may be updated based on patient status, additional functional criteria and insurance authorization.  Follow Up Recommendations No PT follow up    Assistance Recommended at Discharge None  Patient can return home with the following  Assist for transportation;Direct supervision/assist for medications management;Direct supervision/assist for financial management    Equipment Recommendations None recommended by PT  Recommendations for Other Services       Functional Status Assessment Patient has not had a recent decline in their functional status     Precautions / Restrictions Precautions Precautions: None Restrictions Weight  Bearing Restrictions: No      Mobility  Bed Mobility Overal bed mobility: Independent                  Transfers Overall transfer level: Independent                      Ambulation/Gait Ambulation/Gait assistance: Independent Gait Distance (Feet): 400 Feet Assistive device: None Gait Pattern/deviations: WFL(Within Functional Limits) Gait velocity: funcitonal Gait velocity interpretation: >4.37 ft/sec, indicative of normal walking speed   General Gait Details: pt with steady step-through gait, performs head turns, changes gait speed, walks backward, changes stride length all without balance deviation  Stairs Stairs: Yes Stairs assistance: Independent Stair Management: No rails, Alternating pattern Number of Stairs: 20    Wheelchair Mobility    Modified Rankin (Stroke Patients Only)       Balance Overall balance assessment: Independent                                           Pertinent Vitals/Pain Pain Assessment Pain Assessment: 0-10 Pain Score: 4  Pain Location: head Pain Descriptors / Indicators: Headache Pain Intervention(s): Monitored during session    Home Living Family/patient expects to be discharged to:: Private residence Living Arrangements: Spouse/significant other Available Help at Discharge: Family;Available 24 hours/day Type of Home: House Home Access: Stairs to enter Entrance Stairs-Rails: Lawyer of Steps: 5 Alternate Level Stairs-Number of Steps: 20 Home Layout: Two level Home Equipment: Cane - single point      Prior Function Prior Level of Function : Independent/Modified Independent;Working/employed;Driving  Hand Dominance        Extremity/Trunk Assessment   Upper Extremity Assessment Upper Extremity Assessment: Overall WFL for tasks assessed    Lower Extremity Assessment Lower Extremity Assessment: Overall WFL for tasks assessed     Cervical / Trunk Assessment Cervical / Trunk Assessment: Normal  Communication   Communication: Expressive difficulties  Cognition Arousal/Alertness: Awake/alert Behavior During Therapy: WFL for tasks assessed/performed Overall Cognitive Status: Impaired/Different from baseline Area of Impairment: Following commands                       Following Commands: Follows multi-step commands inconsistently (difficulty with multi-step command following at end of session. Follows multi-step commands well for dynamic gait tasks when ambulating)                General Comments General comments (skin integrity, edema, etc.): VSS on RA    Exercises     Assessment/Plan    PT Assessment Patient does not need any further PT services  PT Problem List         PT Treatment Interventions      PT Goals (Current goals can be found in the Care Plan section)       Frequency       Co-evaluation               AM-PAC PT "6 Clicks" Mobility  Outcome Measure Help needed turning from your back to your side while in a flat bed without using bedrails?: None Help needed moving from lying on your back to sitting on the side of a flat bed without using bedrails?: None Help needed moving to and from a bed to a chair (including a wheelchair)?: None Help needed standing up from a chair using your arms (e.g., wheelchair or bedside chair)?: None Help needed to walk in hospital room?: None Help needed climbing 3-5 steps with a railing? : None 6 Click Score: 24    End of Session   Activity Tolerance: Patient tolerated treatment well Patient left: in bed;with call bell/phone within reach;with family/visitor present Nurse Communication: Mobility status PT Visit Diagnosis: Other symptoms and signs involving the nervous system (R29.898)    Time: 8341-9622 PT Time Calculation (min) (ACUTE ONLY): 20 min   Charges:   PT Evaluation $PT Eval Low Complexity: 1 Low          Arlyss Gandy, PT, DPT Acute Rehabilitation Pager: [phone] Office [phone]   Arlyss Gandy 07/01/2021, 9:25 AM

## 2021-07-01 NOTE — Progress Notes (Signed)
  Transition of Care [facility]) Screening Note   Patient Details  Name: Timothy Leblanc Date of Birth: 1960/02/18   Transition of Care [facility]) CM/SW Contact:    Glennon Mac, RN Phone Number: 07/01/2021, 4:12 PM    Transition of Care Department [facility]) has reviewed patient and no TOC needs have been identified at this time. We will continue to monitor patient advancement through interdisciplinary progression rounds. If new patient transition needs arise, please place a TOC consult.  Quintella Baton, RN, BSN  Trauma/Neuro ICU Case Manager [phone]

## 2021-07-01 NOTE — Progress Notes (Signed)
Pt complaining of worsening frontal headache from 3 to 7 despite tylenol. Informed MD Erlinda Hong. New medication ordered, see MAR. Vital signs stable and patient's neuro assessment unchanged.

## 2021-07-01 NOTE — Consult Note (Signed)
Chief Complaint: Patient was seen in consultation today for  Chief Complaint  Patient presents with   Dizziness   at the request of Dr. Amada Jupiter  Referring Physician(s): Dr. Amada Jupiter  Supervising Physician: Dr. Roda Shutters  Patient Status: [facility] - In-pt  History of Present Illness:   HASKEL DEWALT is a 61 y.o. male no past medical history presenting to the emergency room at [facility] with transient episode of dizziness last known well 9:30 AM today. 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  Noticed generalized weakness.  No focal symptoms reported.  Symptoms lasted about 20 minutes.  Also had some incontinence but never lost consciousness.  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 More distal ICA is patent.   Past Medical History:  Diagnosis Date   Diverticulitis 2016    Past Surgical History:  Procedure Laterality Date   COLONOSCOPY  2015   HYDROCELE EXCISION Right 09/08/2019   Procedure: HYDROCELE REPAIR;  Surgeon: Heloise Purpura, MD;  Location: [facility];  Service: Urology;  Laterality: Right;   VASECTOMY  1996    Allergies: Patient has no known allergies.  Medications: Prior to Admission medications   Medication Sig Start Date End Date Taking? Authorizing Provider  cetirizine (ZYRTEC) 10 MG tablet Take 10 mg by mouth daily as needed for allergies.   Yes [provider]  naproxen sodium (ALEVE) 220 MG tablet Take 220-440 mg by mouth daily as needed (headache).   Yes [provider]  tamsulosin (FLOMAX) 0.4 MG CAPS capsule Take 0.4 mg by mouth daily. 07/02/19  Yes [provider]  COVID-19 mRNA vaccine, Moderna, 100 MCG/0.5ML injection Inject into the  muscle. 07/16/20   Judyann Munson, MD     History reviewed. No pertinent family history.  Social History   Socioeconomic History   Marital status: Married    Spouse name: Not on file   Number of children: Not on file   Years of education: Not on file   Highest education level: Not on file  Occupational History   Not on file  Tobacco Use   Smoking status: Former    Packs/day: 1.00    Years: 15.00    Pack years: 15.00    Types: Cigarettes    Quit date: 08/31/1996    Years since quitting: 24.8   Smokeless tobacco: Never  Vaping Use   Vaping Use: Never used  Substance and Sexual Activity   Alcohol use: Yes    Alcohol/week: 2.0 standard drinks    Types: 2 Standard drinks or equivalent per week   Drug use: Yes    Frequency: 1.0 times per week    Types: Marijuana    Comment: once every 2 weeks   Sexual activity: Not on file  Other Topics Concern   Not on file  Social History Narrative   Not on file   Social Determinants of Health   Financial Resource Strain: Not on file  Food Insecurity: Not on file  Transportation Needs: Not on file  Physical Activity: Not on file  Stress: Not on file  Social Connections: Not on file    ECOG Status: 0 - Asymptomatic  Review of Systems: A 12 point ROS discussed and pertinent positives are  indicated in the HPI above.  All other systems are negative.  Review of Systems  Vital Signs: BP 128/84   Pulse (!) 55   Temp 98.4 F (36.9 C) (Oral)   Resp 14   Ht  (1.778 m)   Wt 89.4 kg   SpO2 96%   BMI 28.28 kg/m   Physical Exam  Alert, Oriented x3 RS; not in distess CNS: Cranial nerves Ii to Xii are grossly intact Sensory functions: normal Motor functions: normal     Imaging: MR BRAIN WO CONTRAST  Result Date: 07/01/2021 CLINICAL DATA:  61 year old male code stroke presentation yesterday. Left MCA M2 occlusion. EXAM: MRI HEAD WITHOUT CONTRAST TECHNIQUE: Multiplanar, multiecho pulse sequences of the brain and  surrounding structures were obtained without intravenous contrast. COMPARISON:  CT head and CTA head and neck yesterday. FINDINGS: Brain: Patchy restricted diffusion in the middle and posterior left MCA territory primarily affecting the posterior insula (series 3, image 25), also the posterior left operculum and parietal lobe. There is possible perirolandic cortex involvement of higher on series 3, image 36. T2 and FLAIR hyperintensity in the affected cortex, and T2 and FLAIR hyperintense left MCA branch vessels in that region compatible with asymmetric flow. No hemorrhage or mass effect. No contralateral right hemisphere or posterior fossa restricted diffusion. No midline shift, mass effect, evidence of mass lesion, ventriculomegaly, extra-axial collection or acute intracranial hemorrhage. Cervicomedullary junction and pituitary are within normal limits. Outside of the acute ischemia gray and white matter signal appears normal for age. No chronic cortical encephalomalacia or chronic cerebral blood products identified. 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 Skull and upper cervical spine: Negative visible cervical spine. Visualized bone marrow signal is within normal limits. Sinuses/Orbits: Negative orbits. Trace paranasal sinus mucosal thickening. Other: Mastoids are [place]. Grossly normal visible internal auditory structures. Negative visible scalp and face. IMPRESSION: 1. Patchy acute infarcts in the middle and posterior Left MCA territory. No associated hemorrhage or mass effect. 2. 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 3. But elsewhere normal for age noncontrast MRI  appearance of the Brain. Electronically Signed   By: Odessa Fleming M.D.   On: 07/01/2021 11:19   CT HEAD CODE STROKE WO CONTRAST  Result Date: 06/30/2021 CLINICAL DATA:  Code stroke.  Transient ischemic attack (TIA) EXAM: CT HEAD WITHOUT CONTRAST TECHNIQUE: Contiguous axial images were obtained from the base of the skull through the vertex without intravenous contrast. RADIATION DOSE REDUCTION: This exam was performed according to the departmental dose-optimization program which includes automated exposure control, adjustment of the mA and/or kV according to patient size and/or use of iterative reconstruction technique. COMPARISON:  None Available. FINDINGS: Brain: No evidence of acute large vascular territory infarction, hemorrhage, hydrocephalus, extra-axial collection or mass lesion/mass effect. Vascular: Hyperdense left M2 MCA in the left sylvian fissure (for example, see series 6, images 42/43) Skull: No acute fracture Sinuses/Orbits: No acute fracture.  No acute orbital findings. Other: No mastoid effusions. ASPECTS [facility] Stroke Program Early CT Score) total score (0-10 with 10 being normal): 10. IMPRESSION: 1. Hyperdense left M2 MCA in the left sylvian fissure, concerning for thrombus. Recommend urgent CTA to further evaluate. 2. No evidence of acute large vascular territory infarct or acute hemorrhage by CT.  MRI could provide more sensitive evaluation for acute infarct if clinically warranted. Findings and recommendation discussed with Dr. Wallace CullensGray via telephone at 12:43 p.m. Electronically Signed   By: Feliberto HartsFrederick S Jones M.D.   On: 06/30/2021 12:47   CT ANGIO HEAD NECK W WO CM (CODE STROKE)  Result Date: 06/30/2021 CLINICAL DATA:  Stroke, follow up EXAM: CT ANGIOGRAPHY HEAD AND NECK TECHNIQUE: Multidetector CT imaging of the head and neck was performed using the standard protocol during bolus administration of intravenous contrast. Multiplanar CT image reconstructions and MIPs were obtained to evaluate the  vascular anatomy. Carotid stenosis measurements (when applicable) are obtained utilizing NASCET criteria, using the distal internal carotid diameter as the denominator. RADIATION DOSE REDUCTION: This exam was performed according to the departmental dose-optimization program which includes automated exposure control, adjustment of the mA and/or kV according to patient size and/or use of iterative reconstruction technique. CONTRAST:  75mL OMNIPAQUE IOHEXOL 350 MG/ML SOLN COMPARISON:  Same day CT head. FINDINGS: CTA NECK FINDINGS Aortic arch: Great vessel origins are patent without significant stenosis. Right carotid system: No evidence of dissection, stenosis (50% or greater) or occlusion. Left carotid system: Left common carotid artery is patent. 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 More distal ICA is patent. Vertebral arteries: Codominant. No evidence of dissection, stenosis (50% or greater) or occlusion. Skeleton: Moderate to severe multilevel degenerative disc disease, including disc height loss, endplate sclerosis, and endplate spurring. Other neck: No acute findings. Upper chest: Visualized lung apices are Walkup. Review of the MIP images confirms the above findings CTA HEAD FINDINGS Anterior circulation: Bilateral ICAs, M1 MCAs and bilateral ACAs are patent without proximal hemodynamically significant stenosis. Occlusion of left inferior M2 MCA branch with distal reconstitution. Posterior circulation: Bilateral intradural vertebral arteries, basilar artery and both posterior cerebral arteries are patent without proximal hemodynamically significant stenosis. Venous sinuses: No evidence of dural venous sinus thrombosis. Review of the MIP images confirms the above findings IMPRESSION: 1. Occluded inferior left M2 MCA  vessel, correlating with hyperdense vessel seen on same day CT head. 2. 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 3. Recommend neurointerventional consultation. Findings discussed with Dr. Amada JupiterKirkpatrick via telephone at 1:39 p.m. Electronically Signed   By: Feliberto HartsFrederick S Jones M.D.   On: 06/30/2021 13:58    Labs:  CBC: Recent Labs    06/30/21 1205  WBC 5.6  HGB 16.8  HCT 48.1  PLT 161    COAGS: Recent Labs    06/30/21 1205  INR 1.0  APTT 25    BMP: Recent Labs    06/30/21 1205  NA 136  K 4.2  CL 104  CO2 23  GLUCOSE 105*  BUN 13  CALCIUM 9.7  CREATININE 0.95  GFRNONAA >60    LIVER FUNCTION TESTS: Recent Labs    06/30/21 1205  BILITOT 1.4*  AST 25  ALT 34  ALKPHOS 82  PROT 7.9  ALBUMIN 4.8    TUMOR MARKERS: No results for input(s): AFPTM, CEA, CA199, CHROMGRNA in the last 8760 hours.  Assessment and Plan:  Patient with an episode of dizziness(overly drunken like feeling) lasting for 15 minutes. CTA of the head and neck showed a vascular injury at the distal cervical left ICA.  We will do  a carotid and cerebral angiography for further evaluation.  Thank you for this interesting consult.  I greatly enjoyed meeting Elden Brucato Dowdell and look forward to participating in their care.  A copy of this report was sent to the requesting provider on this date.  Electronically Signed: Marya Amsler, MD 07/01/2021, 12:57 PM   I spent a total of 40 Minutes   in face to face in clinical consultation, greater than 50% of which was counseling/coordinating care for dizziness, Lt ICA injury, carotid and cerebral angiogram.

## 2021-07-01 NOTE — Progress Notes (Signed)
Pt w/ some unstained sleep apnea. Dr. Wilford Corner informed.

## 2021-07-01 NOTE — TOC CAGE-AID Note (Signed)
Transition of Care [facility]) - CAGE-AID Screening   Patient Details  Name: Timothy Leblanc MRN: [id number] Date of Birth: May 21, 1960  [facility] CM/SW Contact:    Hanadi Stanly C Tarpley-Carter, LCSWA Phone Number: 07/01/2021, 2:57 PM   Clinical Narrative: Pt participated in Cage-Aid.  Pt stated he does use substance and ETOH.  Pt was offered resources, due to usage of substance or ETOH.    Jerrie Schussler Tarpley-Carter, MSW, LCSW-A Pronouns:  She/Her/Hers [facility] Clinical Social Worker Direct Number:  [phone] [email or web address]  CAGE-AID Screening:    Have You Ever Felt You Ought to Cut Down on Your Drinking or Drug Use?: Yes Have People Annoyed You By Critizing Your Drinking Or Drug Use?: No Have You Felt Bad Or Guilty About Your Drinking Or Drug Use?: No Have You Ever Had a Drink or Used Drugs First Thing In The Morning to Steady Your Nerves or to Get Rid of a Hangover?: No CAGE-AID Score: 1  Substance Abuse Education Offered: Yes  Substance abuse interventions: Transport planner

## 2021-07-02 MED ORDER — BUTALBITAL-APAP-CAFFEINE 50-325-40 MG PO TABS: 1.0000 | ORAL_TABLET | Freq: Three times a day (TID) | ORAL | 0 refills | Status: DC | PRN

## 2021-07-02 MED ORDER — ASPIRIN 325 MG PO TBEC: 325.0000 mg | DELAYED_RELEASE_TABLET | Freq: Every day | ORAL | 1 refills | Status: DC

## 2021-07-02 MED ORDER — CLOPIDOGREL BISULFATE 75 MG PO TABS: 75.0000 mg | ORAL_TABLET | Freq: Every day | ORAL | 2 refills | Status: DC

## 2021-07-02 MED ORDER — ROSUVASTATIN CALCIUM 40 MG PO TABS: 40.0000 mg | ORAL_TABLET | Freq: Every day | ORAL | 1 refills | Status: DC

## 2021-07-02 MED ORDER — CLOPIDOGREL BISULFATE 75 MG PO TABS: 75.0000 mg | ORAL_TABLET | Freq: Every day | ORAL | 1 refills | Status: DC

## 2021-07-02 NOTE — TOC Transition Note (Signed)
Transition of Care [facility]) - CM/SW Discharge Note   Patient Details  Name: Timothy Leblanc MRN: [id number] Date of Birth: 05/23/1960  [facility]) CM/SW Contact:  Bess Kinds, RN Phone Number: [phone] 07/02/2021, 1:36 PM   Clinical Narrative:     Notified by SLP of recommendation for OP. Order placed. Spoke with patient's spouse on his mobile phone to advise of referral sent to [place] location. No further TOC needs identified at this time.   Final next level of care: OP Rehab Barriers to Discharge: No Barriers Identified   Patient Goals and CMS Choice Patient states their goals for this hospitalization and ongoing recovery are:: return home with wife CMS Medicare.gov Compare Post Acute Care list provided to:: Patient Choice offered to / list presented to : Patient, Spouse  Discharge Placement                       Discharge Plan and Services                                     Social Determinants of Health (SDOH) Interventions     Readmission Risk Interventions     View : No data to display.

## 2021-07-02 NOTE — Discharge Summary (Addendum)
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   butalbital-acetaminophen-caffeine 50-325-40 MG tablet Commonly known as: FIORICET Take 1 tablet by mouth every 8 (eight) hours as needed for headache.   cetirizine 10 MG tablet Commonly known as: ZYRTEC Take 10 mg by mouth daily as needed for allergies.   clopidogrel 75 MG tablet Commonly known as: PLAVIX Take 1 tablet (75 mg total) by mouth daily.   rosuvastatin 40 MG tablet Commonly known as: CRESTOR Take 1 tablet (40 mg total) by mouth daily.   tamsulosin 0.4 MG Caps capsule Commonly known as: FLOMAX Take 0.4 mg by mouth daily.        LABORATORY STUDIES CBC    Component Value Date/Time   WBC 5.6 06/30/2021 1205   RBC 5.69 06/30/2021 1205   HGB 16.8 06/30/2021 1205   HCT 48.1 06/30/2021 1205   PLT 161 06/30/2021 1205   MCV 84.5 06/30/2021 1205   MCH 29.5 06/30/2021 1205   MCHC 34.9 06/30/2021 1205   RDW 13.3 06/30/2021 1205   LYMPHSABS 1.1 06/30/2021 1205   MONOABS 0.5 06/30/2021 1205   EOSABS 0.0 06/30/2021 1205   BASOSABS 0.0  06/30/2021 1205   CMP    Component Value Date/Time   NA 136 06/30/2021 1205   K 4.2 06/30/2021 1205   CL 104 06/30/2021 1205   CO2 23 06/30/2021 1205   GLUCOSE 105 (H) 06/30/2021 1205   BUN 13 06/30/2021 1205   CREATININE 0.95 06/30/2021 1205   CALCIUM 9.7 06/30/2021 1205   PROT 7.9 06/30/2021 1205   ALBUMIN 4.8 06/30/2021 1205   AST 25 06/30/2021 1205   ALT 34 06/30/2021 1205   ALKPHOS 82 06/30/2021 1205   BILITOT 1.4 (H) 06/30/2021 1205   GFRNONAA >60 06/30/2021 1205   COAGS Lab Results  Component Value Date   INR 1.0 06/30/2021   Lipid Panel    Component Value Date/Time   CHOL 218 (H) 07/01/2021 0104   TRIG 107 07/01/2021 0104   HDL 47 07/01/2021 0104   CHOLHDL 4.6 07/01/2021 0104   VLDL 21 07/01/2021 0104   LDLCALC 150 (H) 07/01/2021 0104   HgbA1C  Lab Results  Component Value Date   HGBA1C 5.1 07/01/2021   Urinalysis    Component Value Date/Time   COLORURINE YELLOW 06/30/2021 1223   APPEARANCEUR Frakes 06/30/2021 1223   LABSPEC 1.010 06/30/2021 1223   PHURINE 6.0 06/30/2021 1223   GLUCOSEU NEGATIVE 06/30/2021 1223   HGBUR NEGATIVE 06/30/2021 1223   BILIRUBINUR NEGATIVE  06/30/2021 1223   KETONESUR NEGATIVE 06/30/2021 1223   PROTEINUR NEGATIVE 06/30/2021 1223   NITRITE NEGATIVE 06/30/2021 1223   LEUKOCYTESUR NEGATIVE 06/30/2021 1223   Urine Drug Screen     Component Value Date/Time   LABOPIA NONE DETECTED 06/30/2021 1223   COCAINSCRNUR NONE DETECTED 06/30/2021 1223   LABBENZ NONE DETECTED 06/30/2021 1223   AMPHETMU NONE DETECTED 06/30/2021 1223   THCU NONE DETECTED 06/30/2021 1223   LABBARB NONE DETECTED 06/30/2021 1223    Alcohol Level    Component Value Date/Time   ETH <10 06/30/2021 1236     SIGNIFICANT DIAGNOSTIC STUDIES MR BRAIN WO CONTRAST  Result Date: 07/01/2021 CLINICAL DATA:  61 year old male code stroke presentation yesterday. Left MCA M2 occlusion. EXAM: MRI HEAD WITHOUT CONTRAST TECHNIQUE: Multiplanar, multiecho pulse  sequences of the brain and surrounding structures were obtained without intravenous contrast. COMPARISON:  CT head and CTA head and neck yesterday. FINDINGS: Brain: Patchy restricted diffusion in the middle and posterior left MCA territory primarily affecting the posterior insula (series 3, image 25), also the posterior left operculum and parietal lobe. There is possible perirolandic cortex involvement of higher on series 3, image 36. T2 and FLAIR hyperintensity in the affected cortex, and T2 and FLAIR hyperintense left MCA branch vessels in that region compatible with asymmetric flow. No hemorrhage or mass effect. No contralateral right hemisphere or posterior fossa restricted diffusion. No midline shift, mass effect, evidence of mass lesion, ventriculomegaly, extra-axial collection or acute intracranial hemorrhage. Cervicomedullary junction and pituitary are within normal limits. Outside of the acute ischemia gray and white matter signal appears normal for age. No chronic cortical encephalomalacia or chronic cerebral blood products identified. 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 Skull and upper cervical spine: Negative visible cervical spine. Visualized bone marrow signal is within normal limits. Sinuses/Orbits: Negative orbits. Trace paranasal sinus mucosal thickening. Other: Mastoids are [place]. Grossly normal visible internal auditory structures. Negative visible scalp and face. IMPRESSION: 1. Patchy acute infarcts in the middle and posterior Left MCA territory. No associated hemorrhage or mass effect. 2. 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 3. But elsewhere normal for  age noncontrast MRI appearance of the Brain. Electronically Signed   By: Odessa Fleming M.D.   On: 07/01/2021 11:19   IR 3D Independent Annabell Sabal  Result Date: 07/01/2021 CLINICAL DATA:  Dizziness and abnormal CTA of the head and neck with a segment of dissection, pseudoaneurysm, thrombus formation with stenoses of the left ICA at the skull base, for further evaluation EXAM: IR ANGIO INTRA EXTRACRAN SEL INTERNAL CAROTID UNI RIGHT MOD SED; IR ANGIO VERTEBRAL SEL VERTEBRAL UNI RIGHT MOD SED; IR ANGIO INTRA EXTRACRAN SEL COM CAROTID INNOMINATE UNI LEFT MOD SED; IR ANGIO VERTEBRAL SEL SUBCLAVIAN INNOMINATE UNI LEFT MOD SED; IR ULTRASOUND GUIDANCE VASC ACCESS RIGHT; WORKSTATION 3D RECONSTRUCTION COMPARISON:  Correlation is made with CT of the head and neck MEDICATIONS: Heparin 3,000 units IV; Radial cocktail consisting of 2.5 mg of verapamil, 200 mcg of nitroglycerin and 3000 units of heparin was injected intra-arterially after diluting the medications up to 20 mL with autologous blood. ANESTHESIA/SEDATION: Versed 1.0 mg IV; Fentanyl 50 mcg IV Moderate Sedation Time:  97 minutes The patient was continuously monitored during the procedure by the interventional radiology  nurse under my direct supervision. CONTRAST:  125 mL of Omnipaque 300 FLUOROSCOPY TIME:  Fluoroscopy Time: 29 minutes 54 seconds (582 mGy). COMPLICATIONS: None immediate. TECHNIQUE: Informed written consent was obtained from the patient after a thorough discussion of the procedural risks, benefits and alternatives. All questions were addressed. Maximal Sterile Barrier Technique was utilized including caps, mask, sterile gowns, sterile gloves, sterile drape, hand hygiene and skin antiseptic. A timeout was performed prior to the initiation of the procedure. The right hand at the anatomical snuffbox was prepped and draped in the usual sterile fashion. Thereafter using modified Seldinger technique, branch of the radial artery was accessed using the erumol  micropuncture system. Five [place] by 10 cm Tara sheath was placed. Then, roadmap angiography of the right radial artery was performed demonstrating the radial artery to have a normal appearance. Under roadmap guidance, 5 French Simmons 2 glide catheter and 035 inch Glidewire were navigated into the right subclavian artery and subclavian angiogram was performed. Under roadmap guidance, right vertebral artery was selected and biplane DSA cerebral Angiography was performed over the head. Next, right common carotid artery and then the internal carotid artery followed by biplane DSA angiography was performed over the head. Next, left common carotid artery was selected and biplane DSA angiography of the carotid and cerebral arteries was performed over the neck and head as per the protocol. Subsequently, left subclavian artery was selected and angiogram was performed. In view of some tortuosity at the origin of the vertebral artery, vertebral artery was not catheterized. Next, catheter was removed, radial access site at the anatomical snuffbox was closed by using the Terumo band and controlled hemostasis was obtained. Patient was then transferred to the recovery room in stable condition. FINDINGS: Right CCA: The right common carotid arteriogram demonstrates the right external carotid artery and its major branches to be widely patent. Right ICA: The right internal carotid artery at the bulb to the cranial skull base opacifies normally. Right ECA: The selective right external carotid artery injection demonstrates normal opacification of the right external carotid artery branches. Right Intracranial: The right middle cerebral artery and the right anterior cerebral artery opacify normally into the capillary and venous phases. The left middle cerebral and anterior cerebral arteries opacify normally into the capillary and venous phases upper right ICA injection via the prominent A-comm. There is no P-comm seen. There is no  aneurysm, av malformation or early filling venous structure seen. Right Sided Anterior Venous: The petrous, cavernous and supraclinoid segments are widely patent. The venous phase demonstrates preferential egress of contrast into the right transverse sinus, the sigmoid sinus and the right internal jugular vein. There is no early venous shunting into the venous structures at the skull base or intracranially into the dural sinuses. Right Vertebral Extracranial: The origin of the right vertebral artery is normal. The vessel is seen to opacify normally to the cranial skull base. Normal opacification is seen of the right vertebrobasilar junction and the right posterior-inferior cerebellar artery. Right Vertebral Intracranial: The opacified portion of the basilar artery, the right posterior cerebral artery, the superior cerebellar arteries and the anterior-inferior cerebellar arteries is normal into the capillary and venous phases. Non-opacified blood is seen in the basilar artery from the contralateral vertebral artery. There is no PCOM seen. Right Sided Posterior Venous: The right transverse sinus and the sigmoid sinus are normal and widely patent. ________________________________________________________ Left CCA: The left common carotid arteriogram demonstrates the left external carotid artery and its major branches to be widely patent.  Left ICA: There is approximately 1 cm segment of vascular injury seen at the distal cervical ICA just proximal to the petrous ICA with area of dissection, pseudoaneurysm, thrombus formation with severe more than 90% stenosis. The left internal carotid artery at the bulb to the cranial skull base opacifies normally. Left ECA: The selective left external carotid artery injection demonstrates normal opacification of the left external carotid artery branches. Left Intracranial: There is some blood flow seen distal to the area of vascular injury into the intracranial ICA with suboptimal  opacification of the left MCA and its branches. There is no opacification of the anterior cerebral artery seen. There is no PCOM seen. Left Sided Anterior Venous: The petrous, cavernous and supraclinoid segments are suboptimally opacified. The venous phase demonstrates preferential egress of contrast into the left transverse sinus, the sigmoid sinus and the left internal jugular vein. There is no early venous shunting into the venous structures at the skull base or intracranially into the dural sinuses. Left Vertebral Extracranial: The origin of the left vertebral artery is normal. The vessel is seen to opacify normally to the cranial skull base. IMPRESSION: Three-vessel cerebral angiogram was performed. There is approximately 1 cm segment of severe vascular injury seen at the distal left ICA with dissection, pseudoaneurysm, thrombus formation and severe more than 90% stenosis. Intracranial left ICA has a normal appearance with suboptimal sluggish blood flow into the left MCA and its branches. Right carotid and cerebral angiogram demonstrated satisfactory opacification of the bilateral MCA and ACA via the patent A-comm. PLAN: Medical management with dual antiplatelet agents, statins. Recommend a follow-up CTA in 3 months. Electronically Signed   By: Marjo Bicker M.D.   On: 07/01/2021 18:30   IR US Guide Vasc Access Right  Result Date: 07/01/2021 CLINICAL DATA:  Dizziness and abnormal CTA of the head and neck with a segment of dissection, pseudoaneurysm, thrombus formation with stenoses of the left ICA at the skull base, for further evaluation EXAM: IR ANGIO INTRA EXTRACRAN SEL INTERNAL CAROTID UNI RIGHT MOD SED; IR ANGIO VERTEBRAL SEL VERTEBRAL UNI RIGHT MOD SED; IR ANGIO INTRA EXTRACRAN SEL COM CAROTID INNOMINATE UNI LEFT MOD SED; IR ANGIO VERTEBRAL SEL SUBCLAVIAN INNOMINATE UNI LEFT MOD SED; IR ULTRASOUND GUIDANCE VASC ACCESS RIGHT; WORKSTATION 3D RECONSTRUCTION COMPARISON:  Correlation is made with CT of the  head and neck MEDICATIONS: Heparin 3,000 units IV; Radial cocktail consisting of 2.5 mg of verapamil, 200 mcg of nitroglycerin and 3000 units of heparin was injected intra-arterially after diluting the medications up to 20 mL with autologous blood. ANESTHESIA/SEDATION: Versed 1.0 mg IV; Fentanyl 50 mcg IV Moderate Sedation Time:  97 minutes The patient was continuously monitored during the procedure by the interventional radiology nurse under my direct supervision. CONTRAST:  125 mL of Omnipaque 300 FLUOROSCOPY TIME:  Fluoroscopy Time: 29 minutes 54 seconds (582 mGy). COMPLICATIONS: None immediate. TECHNIQUE: Informed written consent was obtained from the patient after a thorough discussion of the procedural risks, benefits and alternatives. All questions were addressed. Maximal Sterile Barrier Technique was utilized including caps, mask, sterile gowns, sterile gloves, sterile drape, hand hygiene and skin antiseptic. A timeout was performed prior to the initiation of the procedure. The right hand at the anatomical snuffbox was prepped and draped in the usual sterile fashion. Thereafter using modified Seldinger technique, branch of the radial artery was accessed using the erumol micropuncture system. Five [place] by 10 cm Tara sheath was placed. Then, roadmap angiography of the right radial artery was performed demonstrating  the radial artery to have a normal appearance. Under roadmap guidance, 5 French Simmons 2 glide catheter and 035 inch Glidewire were navigated into the right subclavian artery and subclavian angiogram was performed. Under roadmap guidance, right vertebral artery was selected and biplane DSA cerebral Angiography was performed over the head. Next, right common carotid artery and then the internal carotid artery followed by biplane DSA angiography was performed over the head. Next, left common carotid artery was selected and biplane DSA angiography of the carotid and cerebral arteries was performed  over the neck and head as per the protocol. Subsequently, left subclavian artery was selected and angiogram was performed. In view of some tortuosity at the origin of the vertebral artery, vertebral artery was not catheterized. Next, catheter was removed, radial access site at the anatomical snuffbox was closed by using the Terumo band and controlled hemostasis was obtained. Patient was then transferred to the recovery room in stable condition. FINDINGS: Right CCA: The right common carotid arteriogram demonstrates the right external carotid artery and its major branches to be widely patent. Right ICA: The right internal carotid artery at the bulb to the cranial skull base opacifies normally. Right ECA: The selective right external carotid artery injection demonstrates normal opacification of the right external carotid artery branches. Right Intracranial: The right middle cerebral artery and the right anterior cerebral artery opacify normally into the capillary and venous phases. The left middle cerebral and anterior cerebral arteries opacify normally into the capillary and venous phases upper right ICA injection via the prominent A-comm. There is no P-comm seen. There is no aneurysm, av malformation or early filling venous structure seen. Right Sided Anterior Venous: The petrous, cavernous and supraclinoid segments are widely patent. The venous phase demonstrates preferential egress of contrast into the right transverse sinus, the sigmoid sinus and the right internal jugular vein. There is no early venous shunting into the venous structures at the skull base or intracranially into the dural sinuses. Right Vertebral Extracranial: The origin of the right vertebral artery is normal. The vessel is seen to opacify normally to the cranial skull base. Normal opacification is seen of the right vertebrobasilar junction and the right posterior-inferior cerebellar artery. Right Vertebral Intracranial: The opacified portion of  the basilar artery, the right posterior cerebral artery, the superior cerebellar arteries and the anterior-inferior cerebellar arteries is normal into the capillary and venous phases. Non-opacified blood is seen in the basilar artery from the contralateral vertebral artery. There is no PCOM seen. Right Sided Posterior Venous: The right transverse sinus and the sigmoid sinus are normal and widely patent. ________________________________________________________ Left CCA: The left common carotid arteriogram demonstrates the left external carotid artery and its major branches to be widely patent. Left ICA: There is approximately 1 cm segment of vascular injury seen at the distal cervical ICA just proximal to the petrous ICA with area of dissection, pseudoaneurysm, thrombus formation with severe more than 90% stenosis. The left internal carotid artery at the bulb to the cranial skull base opacifies normally. Left ECA: The selective left external carotid artery injection demonstrates normal opacification of the left external carotid artery branches. Left Intracranial: There is some blood flow seen distal to the area of vascular injury into the intracranial ICA with suboptimal opacification of the left MCA and its branches. There is no opacification of the anterior cerebral artery seen. There is no PCOM seen. Left Sided Anterior Venous: The petrous, cavernous and supraclinoid segments are suboptimally opacified. The venous phase demonstrates preferential egress of  contrast into the left transverse sinus, the sigmoid sinus and the left internal jugular vein. There is no early venous shunting into the venous structures at the skull base or intracranially into the dural sinuses. Left Vertebral Extracranial: The origin of the left vertebral artery is normal. The vessel is seen to opacify normally to the cranial skull base. IMPRESSION: Three-vessel cerebral angiogram was performed. There is approximately 1 cm segment of severe  vascular injury seen at the distal left ICA with dissection, pseudoaneurysm, thrombus formation and severe more than 90% stenosis. Intracranial left ICA has a normal appearance with suboptimal sluggish blood flow into the left MCA and its branches. Right carotid and cerebral angiogram demonstrated satisfactory opacification of the bilateral MCA and ACA via the patent A-comm. PLAN: Medical management with dual antiplatelet agents, statins. Recommend a follow-up CTA in 3 months. Electronically Signed   By: Marjo Bicker M.D.   On: 07/01/2021 18:30   ECHOCARDIOGRAM COMPLETE  Result Date: 07/01/2021    ECHOCARDIOGRAM REPORT   Patient Name:   Timothy Leblanc Date of Exam: 07/01/2021 Medical Rec #:  [id number]           Height:       70.0 in Accession #:    [id number]          Weight:       197.1 lb Date of Birth:  09-Aug-1960           BSA:          2.074 m Patient Age:    60 years            BP:           143/98 mmHg Patient Gender: M                   HR:           56 bpm. Exam Location:  Inpatient Procedure: 2D Echo, Cardiac Doppler and Color Doppler Indications:    CVA  History:        Patient has no prior history of Echocardiogram examinations.  Sonographer:    Neomia Dear RDCS Referring Phys: [id number] ASHISH ARORA IMPRESSIONS  1. Left ventricular ejection fraction, by estimation, is 60 to 65%. The left ventricle has normal function. The left ventricle has no regional wall motion abnormalities. There is mild left ventricular hypertrophy. Left ventricular diastolic parameters were normal.  2. Right ventricular systolic function is normal. The right ventricular size is normal.  3. The mitral valve is normal in structure. No evidence of mitral valve regurgitation. No evidence of mitral stenosis.  4. Calcified non coronary cusp. The aortic valve is tricuspid. There is mild calcification of the aortic valve. Aortic valve regurgitation is not visualized. Aortic valve sclerosis is present, with no evidence of aortic  valve stenosis.  5. The inferior vena cava is normal in size with greater than 50% respiratory variability, suggesting right atrial pressure of 3 mmHg. FINDINGS  Left Ventricle: Left ventricular ejection fraction, by estimation, is 60 to 65%. The left ventricle has normal function. The left ventricle has no regional wall motion abnormalities. The left ventricular internal cavity size was normal in size. There is  mild left ventricular hypertrophy. Left ventricular diastolic parameters were normal. Right Ventricle: The right ventricular size is normal. No increase in right ventricular wall thickness. Right ventricular systolic function is normal. Left Atrium: Left atrial size was normal in size. Right Atrium: Right atrial size was normal in size. Pericardium: There  is no evidence of pericardial effusion. Mitral Valve: The mitral valve is normal in structure. No evidence of mitral valve regurgitation. No evidence of mitral valve stenosis. MV peak gradient, 2.5 mmHg. The mean mitral valve gradient is 1.0 mmHg. Tricuspid Valve: The tricuspid valve is normal in structure. Tricuspid valve regurgitation is mild . No evidence of tricuspid stenosis. Aortic Valve: Calcified non coronary cusp. The aortic valve is tricuspid. There is mild calcification of the aortic valve. Aortic valve regurgitation is not visualized. Aortic valve sclerosis is present, with no evidence of aortic valve stenosis. Aortic valve mean gradient measures 3.0 mmHg. Aortic valve peak gradient measures 5.0 mmHg. Aortic valve area, by VTI measures 2.89 cm. Pulmonic Valve: The pulmonic valve was normal in structure. Pulmonic valve regurgitation is mild. No evidence of pulmonic stenosis. Aorta: The aortic root is normal in size and structure. Venous: The inferior vena cava is normal in size with greater than 50% respiratory variability, suggesting right atrial pressure of 3 mmHg. IAS/Shunts: The interatrial septum was not well visualized.  LEFT VENTRICLE  PLAX 2D LVIDd:         4.00 cm     Diastology LVIDs:         2.70 cm     LV e' medial:    8.03 cm/s LV PW:         1.20 cm     LV E/e' medial:  9.0 LV IVS:        1.20 cm     LV e' lateral:   9.75 cm/s LVOT diam:     2.20 cm     LV E/e' lateral: 7.4 LV SV:         75 LV SV Index:   36 LVOT Area:     3.80 cm  LV Volumes (MOD) LV vol d, MOD A2C: 62.5 ml LV vol d, MOD A4C: 99.8 ml LV vol s, MOD A2C: 22.4 ml LV vol s, MOD A4C: 34.5 ml LV SV MOD A2C:     40.1 ml LV SV MOD A4C:     99.8 ml LV SV MOD BP:      50.5 ml RIGHT VENTRICLE RV Basal diam:  3.30 cm RV Mid diam:    1.90 cm RV S prime:     15.90 cm/s TAPSE (M-mode): 2.6 cm LEFT ATRIUM             Index        RIGHT ATRIUM           Index LA diam:        3.30 cm 1.59 cm/m   RA Area:     11.40 cm LA Vol (A2C):   31.8 ml 15.33 ml/m  RA Volume:   19.00 ml  9.16 ml/m LA Vol (A4C):   41.3 ml 19.91 ml/m LA Biplane Vol: 37.0 ml 17.84 ml/m  AORTIC VALVE                    PULMONIC VALVE AV Area (Vmax):    3.00 cm     PV Vmax:          1.03 m/s AV Area (Vmean):   2.94 cm     PV Vmean:         70.000 cm/s AV Area (VTI):     2.89 cm     PV VTI:           0.231 m AV Vmax:  112.00 cm/s  PV Peak grad:     4.2 mmHg AV Vmean:          72.100 cm/s  PV Mean grad:     2.0 mmHg AV VTI:            0.258 m      PR End Diast Vel: 2.69 msec AV Peak Grad:      5.0 mmHg AV Mean Grad:      3.0 mmHg LVOT Vmax:         88.30 cm/s LVOT Vmean:        55.800 cm/s LVOT VTI:          0.196 m LVOT/AV VTI ratio: 0.76  AORTA Ao Root diam: 3.40 cm Ao Asc diam:  3.00 cm MITRAL VALVE               TRICUSPID VALVE MV Area (PHT): 3.99 cm    TR Peak grad:   15.5 mmHg MV Area VTI:   2.88 cm    TR Vmax:        197.00 cm/s MV Peak grad:  2.5 mmHg MV Mean grad:  1.0 mmHg    SHUNTS MV Vmax:       0.79 m/s    Systemic VTI:  0.20 m MV Vmean:      42.3 cm/s   Systemic Diam: 2.20 cm MV Decel Time: 190 msec MV E velocity: 72.30 cm/s MV A velocity: 55.80 cm/s MV E/A ratio:  1.30 Charlton Haws MD  Electronically signed by Charlton Haws MD Signature Date/Time: 07/01/2021/5:55:38 PM    Final    CT HEAD CODE STROKE WO CONTRAST  Result Date: 06/30/2021 CLINICAL DATA:  Code stroke.  Transient ischemic attack (TIA) EXAM: CT HEAD WITHOUT CONTRAST TECHNIQUE: Contiguous axial images were obtained from the base of the skull through the vertex without intravenous contrast. RADIATION DOSE REDUCTION: This exam was performed according to the departmental dose-optimization program which includes automated exposure control, adjustment of the mA and/or kV according to patient size and/or use of iterative reconstruction technique. COMPARISON:  None Available. FINDINGS: Brain: No evidence of acute large vascular territory infarction, hemorrhage, hydrocephalus, extra-axial collection or mass lesion/mass effect. Vascular: Hyperdense left M2 MCA in the left sylvian fissure (for example, see series 6, images 42/43) Skull: No acute fracture Sinuses/Orbits: No acute fracture.  No acute orbital findings. Other: No mastoid effusions. ASPECTS [facility] Stroke Program Early CT Score) total score (0-10 with 10 being normal): 10. IMPRESSION: 1. Hyperdense left M2 MCA in the left sylvian fissure, concerning for thrombus. Recommend urgent CTA to further evaluate. 2. No evidence of acute large vascular territory infarct or acute hemorrhage by CT. MRI could provide more sensitive evaluation for acute infarct if clinically warranted. Findings and recommendation discussed with Dr. Wallace Cullens via telephone at 12:43 p.m. Electronically Signed   By: Feliberto Harts M.D.   On: 06/30/2021 12:47   CT ANGIO HEAD NECK W WO CM (CODE STROKE)  Result Date: 06/30/2021 CLINICAL DATA:  Stroke, follow up EXAM: CT ANGIOGRAPHY HEAD AND NECK TECHNIQUE: Multidetector CT imaging of the head and neck was performed using the standard protocol during bolus administration of intravenous contrast. Multiplanar CT image reconstructions and MIPs were obtained to evaluate  the vascular anatomy. Carotid stenosis measurements (when applicable) are obtained utilizing NASCET criteria, using the distal internal carotid diameter as the denominator. RADIATION DOSE REDUCTION: This exam was performed according to the departmental dose-optimization program which includes automated exposure control, adjustment of the  mA and/or kV according to patient size and/or use of iterative reconstruction technique. CONTRAST:  75mL OMNIPAQUE IOHEXOL 350 MG/ML SOLN COMPARISON:  Same day CT head. FINDINGS: CTA NECK FINDINGS Aortic arch: Great vessel origins are patent without significant stenosis. Right carotid system: No evidence of dissection, stenosis (50% or greater) or occlusion. Left carotid system: Left common carotid artery is patent. 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 More distal ICA is patent. Vertebral arteries: Codominant. No evidence of dissection, stenosis (50% or greater) or occlusion. Skeleton: Moderate to severe multilevel degenerative disc disease, including disc height loss, endplate sclerosis, and endplate spurring. Other neck: No acute findings. Upper chest: Visualized lung apices are [place]. Review of the MIP images confirms the above findings CTA HEAD FINDINGS Anterior circulation: Bilateral ICAs, M1 MCAs and bilateral ACAs are patent without proximal hemodynamically significant stenosis. Occlusion of left inferior M2 MCA branch with distal reconstitution. Posterior circulation: Bilateral intradural vertebral arteries, basilar artery and both posterior cerebral arteries are patent without proximal hemodynamically significant stenosis. Venous sinuses: No evidence of dural venous sinus thrombosis. Review of the MIP images confirms the above findings IMPRESSION: 1. Occluded inferior left M2 MCA  vessel, correlating with hyperdense vessel seen on same day CT head. 2. 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 3. Recommend neurointerventional consultation. Findings discussed with Dr. Amada Jupiter via telephone at 1:39 p.m. Electronically Signed   By: Feliberto Harts M.D.   On: 06/30/2021 13:58   IR ANGIO INTRA EXTRACRAN SEL COM CAROTID INNOMINATE UNI L MOD SED  Result Date: 07/01/2021 CLINICAL DATA:  Dizziness and abnormal CTA of the head and neck with a segment of dissection, pseudoaneurysm, thrombus formation with stenoses of the left ICA at the skull base, for further evaluation EXAM: IR ANGIO INTRA EXTRACRAN SEL INTERNAL CAROTID UNI RIGHT MOD SED; IR ANGIO VERTEBRAL SEL VERTEBRAL UNI RIGHT MOD SED; IR ANGIO INTRA EXTRACRAN SEL COM CAROTID INNOMINATE UNI LEFT MOD SED; IR ANGIO VERTEBRAL SEL SUBCLAVIAN INNOMINATE UNI LEFT MOD SED; IR ULTRASOUND GUIDANCE VASC ACCESS RIGHT; WORKSTATION 3D RECONSTRUCTION COMPARISON:  Correlation is made with CT of the head and neck MEDICATIONS: Heparin 3,000 units IV; Radial cocktail consisting of 2.5 mg of verapamil, 200 mcg of nitroglycerin and 3000 units of heparin was injected intra-arterially after diluting the medications up to 20 mL with autologous blood. ANESTHESIA/SEDATION: Versed 1.0 mg IV; Fentanyl 50 mcg IV Moderate Sedation Time:  97 minutes The patient was continuously monitored during the procedure by the interventional radiology nurse under my direct supervision. CONTRAST:  125 mL of Omnipaque 300 FLUOROSCOPY TIME:  Fluoroscopy Time: 29 minutes 54 seconds (582 mGy). COMPLICATIONS: None immediate. TECHNIQUE: Informed written consent was obtained from the patient after a thorough discussion of the procedural risks, benefits and alternatives. All questions were addressed. Maximal Sterile Barrier Technique was utilized  including caps, mask, sterile gowns, sterile gloves, sterile drape, hand hygiene and skin antiseptic. A timeout was performed prior to the initiation of the procedure. The right hand at the anatomical snuffbox was prepped and draped in the usual sterile fashion. Thereafter using modified Seldinger technique, branch of the radial artery was accessed using the erumol micropuncture system. Five [place] by 10 cm Tara sheath was placed. Then, roadmap angiography of the  right radial artery was performed demonstrating the radial artery to have a normal appearance. Under roadmap guidance, 5 French Simmons 2 glide catheter and 035 inch Glidewire were navigated into the right subclavian artery and subclavian angiogram was performed. Under roadmap guidance, right vertebral artery was selected and biplane DSA cerebral Angiography was performed over the head. Next, right common carotid artery and then the internal carotid artery followed by biplane DSA angiography was performed over the head. Next, left common carotid artery was selected and biplane DSA angiography of the carotid and cerebral arteries was performed over the neck and head as per the protocol. Subsequently, left subclavian artery was selected and angiogram was performed. In view of some tortuosity at the origin of the vertebral artery, vertebral artery was not catheterized. Next, catheter was removed, radial access site at the anatomical snuffbox was closed by using the Terumo band and controlled hemostasis was obtained. Patient was then transferred to the recovery room in stable condition. FINDINGS: Right CCA: The right common carotid arteriogram demonstrates the right external carotid artery and its major branches to be widely patent. Right ICA: The right internal carotid artery at the bulb to the cranial skull base opacifies normally. Right ECA: The selective right external carotid artery injection demonstrates normal opacification of the right external carotid  artery branches. Right Intracranial: The right middle cerebral artery and the right anterior cerebral artery opacify normally into the capillary and venous phases. The left middle cerebral and anterior cerebral arteries opacify normally into the capillary and venous phases upper right ICA injection via the prominent A-comm. There is no P-comm seen. There is no aneurysm, av malformation or early filling venous structure seen. Right Sided Anterior Venous: The petrous, cavernous and supraclinoid segments are widely patent. The venous phase demonstrates preferential egress of contrast into the right transverse sinus, the sigmoid sinus and the right internal jugular vein. There is no early venous shunting into the venous structures at the skull base or intracranially into the dural sinuses. Right Vertebral Extracranial: The origin of the right vertebral artery is normal. The vessel is seen to opacify normally to the cranial skull base. Normal opacification is seen of the right vertebrobasilar junction and the right posterior-inferior cerebellar artery. Right Vertebral Intracranial: The opacified portion of the basilar artery, the right posterior cerebral artery, the superior cerebellar arteries and the anterior-inferior cerebellar arteries is normal into the capillary and venous phases. Non-opacified blood is seen in the basilar artery from the contralateral vertebral artery. There is no PCOM seen. Right Sided Posterior Venous: The right transverse sinus and the sigmoid sinus are normal and widely patent. ________________________________________________________ Left CCA: The left common carotid arteriogram demonstrates the left external carotid artery and its major branches to be widely patent. Left ICA: There is approximately 1 cm segment of vascular injury seen at the distal cervical ICA just proximal to the petrous ICA with area of dissection, pseudoaneurysm, thrombus formation with severe more than 90% stenosis. The  left internal carotid artery at the bulb to the cranial skull base opacifies normally. Left ECA: The selective left external carotid artery injection demonstrates normal opacification of the left external carotid artery branches. Left Intracranial: There is some blood flow seen distal to the area of vascular injury into the intracranial ICA with suboptimal opacification of the left MCA and its branches. There is no opacification of the anterior cerebral artery seen. There is no PCOM seen. Left Sided Anterior Venous: The petrous, cavernous and supraclinoid segments are suboptimally opacified. The  venous phase demonstrates preferential egress of contrast into the left transverse sinus, the sigmoid sinus and the left internal jugular vein. There is no early venous shunting into the venous structures at the skull base or intracranially into the dural sinuses. Left Vertebral Extracranial: The origin of the left vertebral artery is normal. The vessel is seen to opacify normally to the cranial skull base. IMPRESSION: Three-vessel cerebral angiogram was performed. There is approximately 1 cm segment of severe vascular injury seen at the distal left ICA with dissection, pseudoaneurysm, thrombus formation and severe more than 90% stenosis. Intracranial left ICA has a normal appearance with suboptimal sluggish blood flow into the left MCA and its branches. Right carotid and cerebral angiogram demonstrated satisfactory opacification of the bilateral MCA and ACA via the patent A-comm. PLAN: Medical management with dual antiplatelet agents, statins. Recommend a follow-up CTA in 3 months. Electronically Signed   By: Marjo Bicker M.D.   On: 07/01/2021 18:30   IR ANGIO INTRA EXTRACRAN SEL INTERNAL CAROTID UNI R MOD SED  Result Date: 07/01/2021 CLINICAL DATA:  Dizziness and abnormal CTA of the head and neck with a segment of dissection, pseudoaneurysm, thrombus formation with stenoses of the left ICA at the skull base, for  further evaluation EXAM: IR ANGIO INTRA EXTRACRAN SEL INTERNAL CAROTID UNI RIGHT MOD SED; IR ANGIO VERTEBRAL SEL VERTEBRAL UNI RIGHT MOD SED; IR ANGIO INTRA EXTRACRAN SEL COM CAROTID INNOMINATE UNI LEFT MOD SED; IR ANGIO VERTEBRAL SEL SUBCLAVIAN INNOMINATE UNI LEFT MOD SED; IR ULTRASOUND GUIDANCE VASC ACCESS RIGHT; WORKSTATION 3D RECONSTRUCTION COMPARISON:  Correlation is made with CT of the head and neck MEDICATIONS: Heparin 3,000 units IV; Radial cocktail consisting of 2.5 mg of verapamil, 200 mcg of nitroglycerin and 3000 units of heparin was injected intra-arterially after diluting the medications up to 20 mL with autologous blood. ANESTHESIA/SEDATION: Versed 1.0 mg IV; Fentanyl 50 mcg IV Moderate Sedation Time:  97 minutes The patient was continuously monitored during the procedure by the interventional radiology nurse under my direct supervision. CONTRAST:  125 mL of Omnipaque 300 FLUOROSCOPY TIME:  Fluoroscopy Time: 29 minutes 54 seconds (582 mGy). COMPLICATIONS: None immediate. TECHNIQUE: Informed written consent was obtained from the patient after a thorough discussion of the procedural risks, benefits and alternatives. All questions were addressed. Maximal Sterile Barrier Technique was utilized including caps, mask, sterile gowns, sterile gloves, sterile drape, hand hygiene and skin antiseptic. A timeout was performed prior to the initiation of the procedure. The right hand at the anatomical snuffbox was prepped and draped in the usual sterile fashion. Thereafter using modified Seldinger technique, branch of the radial artery was accessed using the erumol micropuncture system. Five [place] by 10 cm Tara sheath was placed. Then, roadmap angiography of the right radial artery was performed demonstrating the radial artery to have a normal appearance. Under roadmap guidance, 5 French Simmons 2 glide catheter and 035 inch Glidewire were navigated into the right subclavian artery and subclavian angiogram was  performed. Under roadmap guidance, right vertebral artery was selected and biplane DSA cerebral Angiography was performed over the head. Next, right common carotid artery and then the internal carotid artery followed by biplane DSA angiography was performed over the head. Next, left common carotid artery was selected and biplane DSA angiography of the carotid and cerebral arteries was performed over the neck and head as per the protocol. Subsequently, left subclavian artery was selected and angiogram was performed. In view of some tortuosity at the origin of the vertebral artery, vertebral  artery was not catheterized. Next, catheter was removed, radial access site at the anatomical snuffbox was closed by using the Terumo band and controlled hemostasis was obtained. Patient was then transferred to the recovery room in stable condition. FINDINGS: Right CCA: The right common carotid arteriogram demonstrates the right external carotid artery and its major branches to be widely patent. Right ICA: The right internal carotid artery at the bulb to the cranial skull base opacifies normally. Right ECA: The selective right external carotid artery injection demonstrates normal opacification of the right external carotid artery branches. Right Intracranial: The right middle cerebral artery and the right anterior cerebral artery opacify normally into the capillary and venous phases. The left middle cerebral and anterior cerebral arteries opacify normally into the capillary and venous phases upper right ICA injection via the prominent A-comm. There is no P-comm seen. There is no aneurysm, av malformation or early filling venous structure seen. Right Sided Anterior Venous: The petrous, cavernous and supraclinoid segments are widely patent. The venous phase demonstrates preferential egress of contrast into the right transverse sinus, the sigmoid sinus and the right internal jugular vein. There is no early venous shunting into the  venous structures at the skull base or intracranially into the dural sinuses. Right Vertebral Extracranial: The origin of the right vertebral artery is normal. The vessel is seen to opacify normally to the cranial skull base. Normal opacification is seen of the right vertebrobasilar junction and the right posterior-inferior cerebellar artery. Right Vertebral Intracranial: The opacified portion of the basilar artery, the right posterior cerebral artery, the superior cerebellar arteries and the anterior-inferior cerebellar arteries is normal into the capillary and venous phases. Non-opacified blood is seen in the basilar artery from the contralateral vertebral artery. There is no PCOM seen. Right Sided Posterior Venous: The right transverse sinus and the sigmoid sinus are normal and widely patent. ________________________________________________________ Left CCA: The left common carotid arteriogram demonstrates the left external carotid artery and its major branches to be widely patent. Left ICA: There is approximately 1 cm segment of vascular injury seen at the distal cervical ICA just proximal to the petrous ICA with area of dissection, pseudoaneurysm, thrombus formation with severe more than 90% stenosis. The left internal carotid artery at the bulb to the cranial skull base opacifies normally. Left ECA: The selective left external carotid artery injection demonstrates normal opacification of the left external carotid artery branches. Left Intracranial: There is some blood flow seen distal to the area of vascular injury into the intracranial ICA with suboptimal opacification of the left MCA and its branches. There is no opacification of the anterior cerebral artery seen. There is no PCOM seen. Left Sided Anterior Venous: The petrous, cavernous and supraclinoid segments are suboptimally opacified. The venous phase demonstrates preferential egress of contrast into the left transverse sinus, the sigmoid sinus and the  left internal jugular vein. There is no early venous shunting into the venous structures at the skull base or intracranially into the dural sinuses. Left Vertebral Extracranial: The origin of the left vertebral artery is normal. The vessel is seen to opacify normally to the cranial skull base. IMPRESSION: Three-vessel cerebral angiogram was performed. There is approximately 1 cm segment of severe vascular injury seen at the distal left ICA with dissection, pseudoaneurysm, thrombus formation and severe more than 90% stenosis. Intracranial left ICA has a normal appearance with suboptimal sluggish blood flow into the left MCA and its branches. Right carotid and cerebral angiogram demonstrated satisfactory opacification of the bilateral MCA  and ACA via the patent A-comm. PLAN: Medical management with dual antiplatelet agents, statins. Recommend a follow-up CTA in 3 months. Electronically Signed   By: Marjo Bicker M.D.   On: 07/01/2021 18:30   IR ANGIO VERTEBRAL SEL SUBCLAVIAN INNOMINATE UNI L MOD SED  Result Date: 07/01/2021 CLINICAL DATA:  Dizziness and abnormal CTA of the head and neck with a segment of dissection, pseudoaneurysm, thrombus formation with stenoses of the left ICA at the skull base, for further evaluation EXAM: IR ANGIO INTRA EXTRACRAN SEL INTERNAL CAROTID UNI RIGHT MOD SED; IR ANGIO VERTEBRAL SEL VERTEBRAL UNI RIGHT MOD SED; IR ANGIO INTRA EXTRACRAN SEL COM CAROTID INNOMINATE UNI LEFT MOD SED; IR ANGIO VERTEBRAL SEL SUBCLAVIAN INNOMINATE UNI LEFT MOD SED; IR ULTRASOUND GUIDANCE VASC ACCESS RIGHT; WORKSTATION 3D RECONSTRUCTION COMPARISON:  Correlation is made with CT of the head and neck MEDICATIONS: Heparin 3,000 units IV; Radial cocktail consisting of 2.5 mg of verapamil, 200 mcg of nitroglycerin and 3000 units of heparin was injected intra-arterially after diluting the medications up to 20 mL with autologous blood. ANESTHESIA/SEDATION: Versed 1.0 mg IV; Fentanyl 50 mcg IV Moderate Sedation Time:   97 minutes The patient was continuously monitored during the procedure by the interventional radiology nurse under my direct supervision. CONTRAST:  125 mL of Omnipaque 300 FLUOROSCOPY TIME:  Fluoroscopy Time: 29 minutes 54 seconds (582 mGy). COMPLICATIONS: None immediate. TECHNIQUE: Informed written consent was obtained from the patient after a thorough discussion of the procedural risks, benefits and alternatives. All questions were addressed. Maximal Sterile Barrier Technique was utilized including caps, mask, sterile gowns, sterile gloves, sterile drape, hand hygiene and skin antiseptic. A timeout was performed prior to the initiation of the procedure. The right hand at the anatomical snuffbox was prepped and draped in the usual sterile fashion. Thereafter using modified Seldinger technique, branch of the radial artery was accessed using the erumol micropuncture system. Five [place] by 10 cm Tara sheath was placed. Then, roadmap angiography of the right radial artery was performed demonstrating the radial artery to have a normal appearance. Under roadmap guidance, 5 French Simmons 2 glide catheter and 035 inch Glidewire were navigated into the right subclavian artery and subclavian angiogram was performed. Under roadmap guidance, right vertebral artery was selected and biplane DSA cerebral Angiography was performed over the head. Next, right common carotid artery and then the internal carotid artery followed by biplane DSA angiography was performed over the head. Next, left common carotid artery was selected and biplane DSA angiography of the carotid and cerebral arteries was performed over the neck and head as per the protocol. Subsequently, left subclavian artery was selected and angiogram was performed. In view of some tortuosity at the origin of the vertebral artery, vertebral artery was not catheterized. Next, catheter was removed, radial access site at the anatomical snuffbox was closed by using the Terumo  band and controlled hemostasis was obtained. Patient was then transferred to the recovery room in stable condition. FINDINGS: Right CCA: The right common carotid arteriogram demonstrates the right external carotid artery and its major branches to be widely patent. Right ICA: The right internal carotid artery at the bulb to the cranial skull base opacifies normally. Right ECA: The selective right external carotid artery injection demonstrates normal opacification of the right external carotid artery branches. Right Intracranial: The right middle cerebral artery and the right anterior cerebral artery opacify normally into the capillary and venous phases. The left middle cerebral and anterior cerebral arteries opacify normally into the capillary  and venous phases upper right ICA injection via the prominent A-comm. There is no P-comm seen. There is no aneurysm, av malformation or early filling venous structure seen. Right Sided Anterior Venous: The petrous, cavernous and supraclinoid segments are widely patent. The venous phase demonstrates preferential egress of contrast into the right transverse sinus, the sigmoid sinus and the right internal jugular vein. There is no early venous shunting into the venous structures at the skull base or intracranially into the dural sinuses. Right Vertebral Extracranial: The origin of the right vertebral artery is normal. The vessel is seen to opacify normally to the cranial skull base. Normal opacification is seen of the right vertebrobasilar junction and the right posterior-inferior cerebellar artery. Right Vertebral Intracranial: The opacified portion of the basilar artery, the right posterior cerebral artery, the superior cerebellar arteries and the anterior-inferior cerebellar arteries is normal into the capillary and venous phases. Non-opacified blood is seen in the basilar artery from the contralateral vertebral artery. There is no PCOM seen. Right Sided Posterior Venous: The  right transverse sinus and the sigmoid sinus are normal and widely patent. ________________________________________________________ Left CCA: The left common carotid arteriogram demonstrates the left external carotid artery and its major branches to be widely patent. Left ICA: There is approximately 1 cm segment of vascular injury seen at the distal cervical ICA just proximal to the petrous ICA with area of dissection, pseudoaneurysm, thrombus formation with severe more than 90% stenosis. The left internal carotid artery at the bulb to the cranial skull base opacifies normally. Left ECA: The selective left external carotid artery injection demonstrates normal opacification of the left external carotid artery branches. Left Intracranial: There is some blood flow seen distal to the area of vascular injury into the intracranial ICA with suboptimal opacification of the left MCA and its branches. There is no opacification of the anterior cerebral artery seen. There is no PCOM seen. Left Sided Anterior Venous: The petrous, cavernous and supraclinoid segments are suboptimally opacified. The venous phase demonstrates preferential egress of contrast into the left transverse sinus, the sigmoid sinus and the left internal jugular vein. There is no early venous shunting into the venous structures at the skull base or intracranially into the dural sinuses. Left Vertebral Extracranial: The origin of the left vertebral artery is normal. The vessel is seen to opacify normally to the cranial skull base. IMPRESSION: Three-vessel cerebral angiogram was performed. There is approximately 1 cm segment of severe vascular injury seen at the distal left ICA with dissection, pseudoaneurysm, thrombus formation and severe more than 90% stenosis. Intracranial left ICA has a normal appearance with suboptimal sluggish blood flow into the left MCA and its branches. Right carotid and cerebral angiogram demonstrated satisfactory opacification of the  bilateral MCA and ACA via the patent A-comm. PLAN: Medical management with dual antiplatelet agents, statins. Recommend a follow-up CTA in 3 months. Electronically Signed   By: Marjo Bicker M.D.   On: 07/01/2021 18:30   IR ANGIO VERTEBRAL SEL VERTEBRAL UNI R MOD SED  Result Date: 07/01/2021 CLINICAL DATA:  Dizziness and abnormal CTA of the head and neck with a segment of dissection, pseudoaneurysm, thrombus formation with stenoses of the left ICA at the skull base, for further evaluation EXAM: IR ANGIO INTRA EXTRACRAN SEL INTERNAL CAROTID UNI RIGHT MOD SED; IR ANGIO VERTEBRAL SEL VERTEBRAL UNI RIGHT MOD SED; IR ANGIO INTRA EXTRACRAN SEL COM CAROTID INNOMINATE UNI LEFT MOD SED; IR ANGIO VERTEBRAL SEL SUBCLAVIAN INNOMINATE UNI LEFT MOD SED; IR ULTRASOUND GUIDANCE VASC  ACCESS RIGHT; WORKSTATION 3D RECONSTRUCTION COMPARISON:  Correlation is made with CT of the head and neck MEDICATIONS: Heparin 3,000 units IV; Radial cocktail consisting of 2.5 mg of verapamil, 200 mcg of nitroglycerin and 3000 units of heparin was injected intra-arterially after diluting the medications up to 20 mL with autologous blood. ANESTHESIA/SEDATION: Versed 1.0 mg IV; Fentanyl 50 mcg IV Moderate Sedation Time:  97 minutes The patient was continuously monitored during the procedure by the interventional radiology nurse under my direct supervision. CONTRAST:  125 mL of Omnipaque 300 FLUOROSCOPY TIME:  Fluoroscopy Time: 29 minutes 54 seconds (582 mGy). COMPLICATIONS: None immediate. TECHNIQUE: Informed written consent was obtained from the patient after a thorough discussion of the procedural risks, benefits and alternatives. All questions were addressed. Maximal Sterile Barrier Technique was utilized including caps, mask, sterile gowns, sterile gloves, sterile drape, hand hygiene and skin antiseptic. A timeout was performed prior to the initiation of the procedure. The right hand at the anatomical snuffbox was prepped and draped in the usual  sterile fashion. Thereafter using modified Seldinger technique, branch of the radial artery was accessed using the erumol micropuncture system. Five [place] by 10 cm Tara sheath was placed. Then, roadmap angiography of the right radial artery was performed demonstrating the radial artery to have a normal appearance. Under roadmap guidance, 5 French Simmons 2 glide catheter and 035 inch Glidewire were navigated into the right subclavian artery and subclavian angiogram was performed. Under roadmap guidance, right vertebral artery was selected and biplane DSA cerebral Angiography was performed over the head. Next, right common carotid artery and then the internal carotid artery followed by biplane DSA angiography was performed over the head. Next, left common carotid artery was selected and biplane DSA angiography of the carotid and cerebral arteries was performed over the neck and head as per the protocol. Subsequently, left subclavian artery was selected and angiogram was performed. In view of some tortuosity at the origin of the vertebral artery, vertebral artery was not catheterized. Next, catheter was removed, radial access site at the anatomical snuffbox was closed by using the Terumo band and controlled hemostasis was obtained. Patient was then transferred to the recovery room in stable condition. FINDINGS: Right CCA: The right common carotid arteriogram demonstrates the right external carotid artery and its major branches to be widely patent. Right ICA: The right internal carotid artery at the bulb to the cranial skull base opacifies normally. Right ECA: The selective right external carotid artery injection demonstrates normal opacification of the right external carotid artery branches. Right Intracranial: The right middle cerebral artery and the right anterior cerebral artery opacify normally into the capillary and venous phases. The left middle cerebral and anterior cerebral arteries opacify normally into the  capillary and venous phases upper right ICA injection via the prominent A-comm. There is no P-comm seen. There is no aneurysm, av malformation or early filling venous structure seen. Right Sided Anterior Venous: The petrous, cavernous and supraclinoid segments are widely patent. The venous phase demonstrates preferential egress of contrast into the right transverse sinus, the sigmoid sinus and the right internal jugular vein. There is no early venous shunting into the venous structures at the skull base or intracranially into the dural sinuses. Right Vertebral Extracranial: The origin of the right vertebral artery is normal. The vessel is seen to opacify normally to the cranial skull base. Normal opacification is seen of the right vertebrobasilar junction and the right posterior-inferior cerebellar artery. Right Vertebral Intracranial: The opacified portion of the basilar  artery, the right posterior cerebral artery, the superior cerebellar arteries and the anterior-inferior cerebellar arteries is normal into the capillary and venous phases. Non-opacified blood is seen in the basilar artery from the contralateral vertebral artery. There is no PCOM seen. Right Sided Posterior Venous: The right transverse sinus and the sigmoid sinus are normal and widely patent. ________________________________________________________ Left CCA: The left common carotid arteriogram demonstrates the left external carotid artery and its major branches to be widely patent. Left ICA: There is approximately 1 cm segment of vascular injury seen at the distal cervical ICA just proximal to the petrous ICA with area of dissection, pseudoaneurysm, thrombus formation with severe more than 90% stenosis. The left internal carotid artery at the bulb to the cranial skull base opacifies normally. Left ECA: The selective left external carotid artery injection demonstrates normal opacification of the left external carotid artery branches. Left  Intracranial: There is some blood flow seen distal to the area of vascular injury into the intracranial ICA with suboptimal opacification of the left MCA and its branches. There is no opacification of the anterior cerebral artery seen. There is no PCOM seen. Left Sided Anterior Venous: The petrous, cavernous and supraclinoid segments are suboptimally opacified. The venous phase demonstrates preferential egress of contrast into the left transverse sinus, the sigmoid sinus and the left internal jugular vein. There is no early venous shunting into the venous structures at the skull base or intracranially into the dural sinuses. Left Vertebral Extracranial: The origin of the left vertebral artery is normal. The vessel is seen to opacify normally to the cranial skull base. IMPRESSION: Three-vessel cerebral angiogram was performed. There is approximately 1 cm segment of severe vascular injury seen at the distal left ICA with dissection, pseudoaneurysm, thrombus formation and severe more than 90% stenosis. Intracranial left ICA has a normal appearance with suboptimal sluggish blood flow into the left MCA and its branches. Right carotid and cerebral angiogram demonstrated satisfactory opacification of the bilateral MCA and ACA via the patent A-comm. PLAN: Medical management with dual antiplatelet agents, statins. Recommend a follow-up CTA in 3 months. Electronically Signed   By: Marjo BickerAmar  Amaresh M.D.   On: 07/01/2021 18:30      HISTORY OF PRESENT ILLNESS [name] is a 61 y.o. male with no significant past medical history presenting with dizziness episode lasting approximately 20 minutes. [facility] Plan for cerebral angiogram today with Dr. Gretel AcreAmersh. Recommend medical management and follow up CTA in 3 months. Referral placed for outpatient neurology and outpatient speech therapy.   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 Occluded inferior left M2 MCA vessel, Cerebral angio 1 cm segment of severe vascular injury seen at the distal left ICA with dissection, pseudoaneurysm, thrombus formation and severe more than 90% stenosis.  Left MCA patent MRI  Patchy acute infarcts in the middle and posterior Left MCA territory. No associated hemorrhage or mass effect. 2D Echo EF 60 to 65% LDL 150 HgbA1c 5.1 UDS negative VTE prophylaxis - SCDs No antithrombotic prior to admission, now on aspirin 325 mg daily and clopidogrel 75 mg daily DAPT for 3  months and then aspirin alone given severe left ICA stenosis .  Repeat CTA in 3 months per interventional radiology Therapy recommendations:  No follow needed Disposition:  Pending   Hypotension 2L NS bolus overnight 5/18 BP maintaining 120 systolic now, consider midodrine if he becomes hypotensive again Avoid low BP   Hyperlipidemia LDL 150, goal < 70 Add Crestor   Continue statin at discharge   Other Stroke Risk Factors ETOH use, alcohol level <10, advised to drink no more than 2 drink(s) a day Substance abuse -patient admitted for using THC, cessation education provided   Other Active Problems BPH- Home med: flomax 0.4  RN Pressure Injury Documentation:     DISCHARGE EXAM Blood pressure (!) 142/99, pulse 75, temperature 98.3 F (36.8 C), temperature source Oral, resp. rate (!) 25, height  (1.778 m), weight 89.4 kg, SpO2 93 %. Physical Exam  Constitutional: Appears well-developed and well-nourished.  Cardiovascular: Normal rate and regular rhythm.  Respiratory: Effort normal, non-labored breathing   Neuro: Mental  Status: Patient is awake, alert, oriented to person, place, month, year, and situation. Speech is slow with long sentences, some word finding difficulty Cranial Nerves: II: Visual Fields are full. Pupils are equal, round, and reactive to light.   III,IV, VI: EOMI without ptosis or diploplia.  V: Facial sensation is symmetric to temperature VII: Facial movement is symmetric resting and smiling VIII: Hearing is intact to voice X: Palate elevates symmetrically XI: Shoulder shrug is symmetric. XII: Tongue protrudes midline without atrophy or fasciculations.  Motor: Tone is normal. Bulk is normal. 5/5 strength was present in all four extremities.  Sensory: Sensation is symmetric to light touch and temperature in the arms and legs. No extinction to DSS present.  Cerebellar: FNF and HKS are intact bilaterally Gait is steady, ambulating well    Discharge Diet       Diet   Diet regular Room service appropriate? Yes; Fluid consistency: Thin   liquids  DISCHARGE PLAN Disposition:  Home  aspirin 325 mg daily and clopidogrel 75 mg daily for secondary stroke prevention for 3 months then ASA  alone. Ongoing stroke risk factor control by Primary Care Physician at time of discharge Follow-up PCP Ozella Rocks, MD in 2 weeks. Follow-up in [facility] in 8-12 weeks  30 minutes were spent preparing discharge.  Patient seen and examined by NP/APP with MD. MD to update note as needed.   Elmer Picker, DNP, FNP-BC [facility] Pager: [phone]  ATTENDING ATTESTATION:  Dr. Viviann Spare evaluated pt independently, reviewed imaging, chart, labs. Discussed and formulated plan with the APP. Please see APP note above for details.    Total 36 minutes spent on counseling patient and coordinating care, writing notes and reviewing chart and discharge coordination.    Carroll Ranney,MD

## 2021-07-02 NOTE — Plan of Care (Signed)
I have reviewed all educational material with patient and his wife. All questions answered and discharge instructions provided.

## 2021-07-02 NOTE — Evaluation (Signed)
Speech Language Pathology Evaluation Patient Details Name: Timothy Leblanc MRN: [id number] DOB: 11-05-1960 Today's Date: 07/02/2021 Time: 1215-1300 SLP Time Calculation (min) (ACUTE ONLY): 45 min  Problem List:  Patient Active Problem List   Diagnosis Date Noted   Stroke (HCC) 06/30/2021   Stroke (cerebrum) (HCC) 06/30/2021   Acute ischemic stroke (HCC) 06/30/2021   Past Medical History:  Past Medical History:  Diagnosis Date   Diverticulitis 2016   Past Surgical History:  Past Surgical History:  Procedure Laterality Date   COLONOSCOPY  2015   HYDROCELE EXCISION Right 09/08/2019   Procedure: HYDROCELE REPAIR;  Surgeon: Heloise Purpura, MD;  Location: [facility];  Service: Urology;  Laterality: Right;   IR 3D INDEPENDENT WKST  07/01/2021   IR ANGIO INTRA EXTRACRAN SEL COM CAROTID INNOMINATE UNI L MOD SED  07/01/2021   IR ANGIO INTRA EXTRACRAN SEL INTERNAL CAROTID UNI R MOD SED  07/01/2021   IR ANGIO VERTEBRAL SEL SUBCLAVIAN INNOMINATE UNI L MOD SED  07/01/2021   IR ANGIO VERTEBRAL SEL VERTEBRAL UNI R MOD SED  07/01/2021   IR US GUIDE VASC ACCESS RIGHT  07/01/2021   VASECTOMY  1996   HPI:  Pt is a 61 y.o. male who presented to the ED with transient episode of dizziness, confusion, and word retrieval difficulty. 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 MRI brain 5/19: Patchy acute infarcts in the middle and posterior Left MCA territory. No pertinent PMH.   Assessment / Plan / Recommendation Clinical Impression  Patient presents with a mixed receptive-expressive aphasia as per this evaluation. Speech was fully intelligible and without dysarthria or apraxia. Patient was oriented and demonstrated some awareness but did exhibit deficits in cognitive areas: self-monitoring, initiating, organizing.  Receptive language impairments consisted of deficits in complex yes/no questions and following two-step directions. Expressive language impairments consisted of deficits in automatic speech for completing common phrases, repetition at phrase level, divergent naming, and in written expression he would omit single or multiple letters ("bidge" for bridge, "the boy is gett cookies"). He was able to find errors and correct when cued to do so. During conversational language production, he exhibited frequent anomia and circumlocution such as he was able to describe his job but was not able to tell SLP what his job title was. He was very verbose and did not exhibiti appropriate pragmatic language skills of turn-taking, self-monitoring or interpretation of non-verbal communication and he would continue to talk when SLP attempting to interject. Patient is starting to show some awareness to his deficits and he told SLP that he didn't realize exactly what having a stroke meant, meaning he did not realize that the stroke causes injury to his brain. SLP is recommending outpatient speech-language pathology services which patient and family all in agreement with. Patient would not be able to return to work at this time with his aphasia and cognitive deficits.    SLP Assessment  SLP Recommendation/Assessment: All further Speech Lanaguage Pathology  needs can be addressed in the next venue of care SLP Visit Diagnosis: Aphasia (R47.01);Cognitive communication deficit (R41.841)    Recommendations for follow up therapy are one component of a multi-disciplinary discharge planning process, led by the attending physician.  Recommendations may be updated based on patient status, additional functional criteria and insurance authorization.    Follow Up Recommendations  Outpatient SLP  Assistance Recommended at Discharge  Intermittent Supervision/Assistance  Functional Status Assessment Patient has had a recent decline in their  functional status and demonstrates the ability to make significant improvements in function in a reasonable and predictable amount of time.  Frequency and Duration           SLP Evaluation Cognition  Overall Cognitive Status: Impaired/Different from baseline Arousal/Alertness: Awake/alert Orientation Level: Oriented X4 Attention: Selective Selective Attention: Impaired Selective Attention Impairment: Verbal basic;Verbal complex Memory: Appears intact Executive Function: Sequencing;Organizing;Self Monitoring Sequencing: Impaired Sequencing Impairment: Verbal complex;Verbal basic Organizing: Impaired Organizing Impairment: Verbal basic;Verbal complex Self Monitoring: Impaired Self Monitoring Impairment: Verbal basic;Verbal complex Safety/Judgment: Appears intact       Comprehension  Auditory Comprehension Overall Auditory Comprehension: Impaired Yes/No Questions: Impaired Basic Biographical Questions: Other (comment) (100%) Basic Immediate Environment Questions: Other (comment) (100%) Complex Questions: 50-74% accurate Conversation: Simple Interfering Components: Attention;Processing speed EffectiveTechniques: Extra processing time;Repetition;Pausing;Stressing words;Slowed speech Visual Recognition/Discrimination Discrimination: Within Function Limits Reading Comprehension Reading Status: Within funtional limits    Expression Expression Primary Mode of Expression: Verbal Verbal Expression Overall Verbal Expression: Impaired Initiation: Impaired Automatic Speech: Name;Social Response;Counting;Day of week Level of Generative/Spontaneous Verbalization: Conversation;Sentence Repetition: Impaired Level of Impairment: Phrase level Naming: Impairment Responsive: 76-100% accurate Confrontation: Within functional limits Convergent: 75-100% accurate Divergent: 50-74% accurate Verbal Errors: Phonemic paraphasias;Aware of errors;Perseveration Pragmatics:  Impairment Impairments: Interpretation of nonverbal communication;Turn Taking Interfering Components: Attention Effective Techniques: Open ended questions;Semantic cues Non-Verbal Means of Communication: Not applicable Written Expression Dominant Hand: Right Written Expression: Within Functional Limits   Oral / Motor  Oral Motor/Sensory Function Overall Oral Motor/Sensory Function: Mild impairment Facial ROM: Reduced left Facial Symmetry: Abnormal symmetry left Facial Strength: Within Functional Limits Facial Sensation: Within Functional Limits Lingual ROM: Within Functional Limits Lingual Symmetry: Within Functional Limits Lingual Strength: Within Functional Limits Lingual Sensation: Within Functional Limits Velum: Within Functional Limits Mandible: Within Functional Limits Motor Speech Overall Motor Speech: Appears within functional limits for tasks assessed Respiration: Within functional limits Phonation: Normal Resonance: Within functional limits Articulation: Within functional limitis Intelligibility: Intelligible Motor Planning: Witnin functional limits Motor Speech Errors: Not applicable           Angela Nevin, MA, CCC-SLP Speech Therapy

## 2021-07-04 ENCOUNTER — Encounter: Payer: Self-pay | Admitting: Neurology

## 2021-07-05 ENCOUNTER — Ambulatory Visit: Payer: 59 | Attending: Neurology

## 2021-07-05 DIAGNOSIS — R4701 Aphasia: Secondary | ICD-10-CM | POA: Diagnosis present

## 2021-07-05 DIAGNOSIS — R41841 Cognitive communication deficit: Secondary | ICD-10-CM | POA: Diagnosis present

## 2021-07-05 DIAGNOSIS — R130 Aphagia: Secondary | ICD-10-CM | POA: Diagnosis present

## 2021-07-05 NOTE — Therapy (Addendum)
OUTPATIENT SPEECH LANGUAGE PATHOLOGY APHASIA EVALUATION   Patient Name: Timothy PleasureChristopher S Leblanc MRN: [id number] DOB:[birth year], 61 y.o., male Today's Date: 07/06/2021  PCP: None on File REFERRING PROVIDER: Elmer PickerShafer, Devon, NP    End of Session - 07/06/21 1231     Visit Number 1    Number of Visits 25    Date for SLP Re-Evaluation 09/27/21    SLP Start Time 1150    SLP Stop Time  1240    SLP Time Calculation (min) 50 min    Activity Tolerance Patient tolerated treatment well             Past Medical History:  Diagnosis Date   Diverticulitis 2016   Past Surgical History:  Procedure Laterality Date   COLONOSCOPY  2015   HYDROCELE EXCISION Right 09/08/2019   Procedure: HYDROCELE REPAIR;  Surgeon: Heloise PurpuraBorden, Lester, MD;  Location: [facility];  Service: Urology;  Laterality: Right;   IR 3D INDEPENDENT WKST  07/01/2021   IR ANGIO INTRA EXTRACRAN SEL COM CAROTID INNOMINATE UNI L MOD SED  07/01/2021   IR ANGIO INTRA EXTRACRAN SEL INTERNAL CAROTID UNI R MOD SED  07/01/2021   IR ANGIO VERTEBRAL SEL SUBCLAVIAN INNOMINATE UNI L MOD SED  07/01/2021   IR ANGIO VERTEBRAL SEL VERTEBRAL UNI R MOD SED  07/01/2021   IR US GUIDE VASC ACCESS RIGHT  07/01/2021   VASECTOMY  1996   Patient Active Problem List   Diagnosis Date Noted   Stroke (HCC) 06/30/2021   Stroke (cerebrum) (HCC) 06/30/2021   Acute ischemic stroke (HCC) 06/30/2021    ONSET DATE: 06-30-21   REFERRING DIAG: CVA  THERAPY DIAG:  Aphasia Cognitive Communication Deficit  Rationale for Evaluation and Treatment Rehabilitation  SUBJECTIVE:   SUBJECTIVE STATEMENT: "It's much better - better every day. I'm happy it's better every day." Pt accompanied by: significant other  PERTINENT HISTORY: [facility] with transient episode of dizziness last known well 9:30 AM today. 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 Noticed  generalized weakness. No focal symptoms reported. Symptoms lasted about 20 minutes. Also had some incontinence but never lost consciousness.  PAIN:  Are you having pain? Yes: NPRS scale: 1-2/10 Pain location: anterior head/headache Pain description: intermittent Aggravating factors: IDK Relieving factors: meds  FALLS: Has patient fallen in last 6 months?  No  LIVING ENVIRONMENT: Lives with: lives with their spouse Lives in: House/apartment  PLOF:  Level of assistance: Independent with ADLs Employment: Full-time employment   PATIENT GOALS "I'm just frustrated all of this happened. I want to be done with all this."  OBJECTIVE:   DIAGNOSTIC FINDINGS:  MRI 07-01-21  1. Patchy acute infarcts in the middle and posterior Left MCA territory. No associated hemorrhage or mass effect. 2. 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 3. But elsewhere normal for age noncontrast MRI appearance of the Brain.  COGNITION: Overall cognitive status: Impaired: Memory: Impaired: Short term Auditory, Awareness: Impaired: Anticipatory, and possible deficit in insight of deficits. Pt stated he "could make it work if I had to" if he returned to work in two days. Areas of impairment: Memory and Awareness Comments: Pt looked to wife to recall details of MD visit yesterday and also of other details presented auditorily. Pt may require cognitive communication assessment during the therapy course.  AUDITORY COMPREHENSION: Overall auditory comprehension: Impaired: moderately complex and complex and more thorough testing might be necessary YES/NO questions: Impaired: moderately complex Following directions: Not tested Conversation: Moderately Complex Interfering components: anxiety and "not angry, that's not right" (frustration) Effective technique: extra processing time,  repetition/stressing words, and slowed speech   READING COMPREHENSION: To be assessed in first 1-3 sessions  EXPRESSION: verbal  VERBAL EXPRESSION: Level of generative/spontaneous verbalization: conversation Repetition: Impaired: sentence Naming: Confrontation: 100% with simple common words; SLP suspects with complex naming pt would require compensations or SLP cues Pragmatics: Appears intact Comments: In mod complex topics pt attempted to use compensations for anomia however synonyms and circumlocution not always successful, making pt's speech not precise/Sandner 100% of the time, and leading to high usage of vague speech in conversation - proportional to the complexity necessary to respond to SLP questions/commnents. Additionally, pt's semantic paraphasias were evident (e.g., "voluntary"/volunteer). Rare abandoning of utterances when pt experienced aphasic errors. Interfering components:  level of frustration/emotion Effective technique:  extra time  WRITTEN EXPRESSION:  To be assessed later. Pt uses written/typewritten communication in his work daily.  MOTOR SPEECH: Overall motor speech: Appears intact  ORAL MOTOR EXAMINATION: WNL  STANDARDIZED ASSESSMENTS: QAB: Mild  Summary      Word comprehension 10.00  Sentence comprehension 8.33  Word finding 8.58  Grammatical construction 8.75  Speech motor programming 7.50  Repetition 8.33  Reading 9.17  QAB overall 8.72     PATIENT REPORTED OUTCOME MEASURES (PROM): To be completed next session    TODAY'S TREATMENT:  Conversation re: evaluation findings, how wife can assist pt at home/cueing, homework to target and procedure for the homework. When to take breaks with homework, loose framework of speech therapy. SLP demonstrated to wife how to cue pt, and also told pt that it was more helpful to allow pt to work the error out than to provide the word for him, and for pt to work the error out instead of abandon utterance.   PATIENT  EDUCATION: Education details: see above Person educated: Patient and Spouse Education method: Explanation, Demonstration, Verbal cues, and Handouts Education comprehension: verbalized understanding and needs further education   GOALS: Goals reviewed with patient? Yes  SHORT TERM GOALS: Target date: 08/02/2021    Pt will engage in description (picture, object, room, etc)  and other mod complex verbal expression tasks with 85% success and rare min A for accurate language use in 3 sessions Baseline: Goal status: INITIAL  2.  Pt will demo [facility] speech/language in 5 minutes mod complex conversation using compensations, in 3 sessions Baseline:  Goal status: INITIAL  3.  Pt will tell SLP 4 compensatory measures he could use for iimproved verbal expression Baseline:  Goal status: INITIAL  4.  Pt will write 4-5 sentences with modified independence in 3 sessions Baseline:  Goal status: INITIAL  5.  Wife will demo appropriate cueing for pt during 3 sessions Baseline:  Goal status: INITIAL  6.  Pt will complete Communication Effectiveness Survey in first session Baseline:  Goal status: INITIAL  LONG TERM GOALS: Target date: 09/27/2021    Pt will improve his Communication Effectiveness Survey score taken in the last 1-2 sessions of ST, compared to his score in the first session Baseline:  Goal status: INITIAL  2.  Pt will demo [facility] speech/language skills in 15 minutes mod complex/complex conversation with modified independence in 3 sessions Baseline:  Goal status: INITIAL  3.  Pt will demo [facility] auditory comprehension ability in 15 minutes  mod complex/complex conversation with modified independence in 3 sessions Baseline:  Goal status: INITIAL   ASSESSMENT:  CLINICAL IMPRESSION: Patient is a 61 y.o. male who was seen today for evaluation of receptive and expressive language, 5 days after his CVA. 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 Skilled ST will assist him in rehabilitating previous language skills to WNL levels.  OBJECTIVE IMPAIRMENTS include awareness, expressive language, receptive language, and aphasia. These impairments are limiting patient from return to work, household responsibilities, and effectively communicating at home and in community. Factors affecting potential to achieve goals and functional outcome are  possibly anxiety/frustration, which pt mentioned today . Patient will benefit from skilled SLP services to address above impairments and improve overall function.  REHAB POTENTIAL: Excellent  PLAN: SLP FREQUENCY: 2x/week  SLP DURATION: 8 weeks  PLANNED INTERVENTIONS: Language facilitation, Environmental controls, Cueing hierachy, Cognitive reorganization, Internal/external aids, Functional tasks, Multimodal communication approach, SLP instruction and feedback, Compensatory strategies, and Patient/family education    [facility], CCC-SLP 07/06/2021, 12:33 PM

## 2021-07-05 NOTE — Patient Outreach (Signed)
Received a red flag Emmi stroke notification for Mr. Newey . I have assigned Valente David, RN to call for follow up and determine if there are any Case Management needs.    [facility], [place], [place] Management [phone]

## 2021-07-06 ENCOUNTER — Other Ambulatory Visit: Payer: Self-pay | Admitting: *Deleted

## 2021-07-06 NOTE — Patient Outreach (Signed)
[facility]) Care Management  07/06/2021  Romy Mcgue [place] November 20, 1960 [id number]   RED ON EMMI ALERT - Stroke Day # 1 Date: 5/22 Red Alert Reason: Feeling worse overall?  YES   Outreach attempt #1, unsuccessful, HIPAA compliant voice message left.    Plan: RN CM will follow up within the next 2-3 business days.  Kemper Durie, RN, MSN, CCM [facility] Care Management  [facility] Manager [phone]

## 2021-07-07 ENCOUNTER — Ambulatory Visit: Payer: 59

## 2021-07-07 DIAGNOSIS — R130 Aphagia: Secondary | ICD-10-CM | POA: Diagnosis not present

## 2021-07-07 NOTE — Therapy (Signed)
OUTPATIENT SPEECH LANGUAGE PATHOLOGY TREATMENT NOTE   Patient Name: Timothy Leblanc MRN: [id number] DOB:07-Mar-1960, 61 y.o., male Today's Date: 07/07/2021  PCP: None on File REFERRING PROVIDER: Janine Ores, NP    End of Session - 07/07/21 1653     Visit Number 2    Number of Visits 25    Date for SLP Re-Evaluation 09/27/21    SLP Start Time [id number]    SLP Stop Time  1448    SLP Time Calculation (min) 43 min    Activity Tolerance Patient tolerated treatment well              Past Medical History:  Diagnosis Date   Diverticulitis 2016   Past Surgical History:  Procedure Laterality Date   COLONOSCOPY  2015   HYDROCELE EXCISION Right 09/08/2019   Procedure: HYDROCELE REPAIR;  Surgeon: Raynelle Bring, MD;  Location: [facility];  Service: Urology;  Laterality: Right;   IR 3D INDEPENDENT WKST  07/01/2021   IR ANGIO INTRA EXTRACRAN SEL COM CAROTID INNOMINATE UNI L MOD SED  07/01/2021   IR ANGIO INTRA EXTRACRAN SEL INTERNAL CAROTID UNI R MOD SED  07/01/2021   IR ANGIO VERTEBRAL SEL SUBCLAVIAN INNOMINATE UNI L MOD SED  07/01/2021   IR ANGIO VERTEBRAL SEL VERTEBRAL UNI R MOD SED  07/01/2021   IR US GUIDE VASC ACCESS RIGHT  07/01/2021   VASECTOMY  1996   Patient Active Problem List   Diagnosis Date Noted   Stroke [facility]) 06/30/2021   Stroke (cerebrum) ([place]) 06/30/2021   Acute ischemic stroke ([place]) 06/30/2021    ONSET DATE: 06-30-21   REFERRING DIAG: CVA  THERAPY DIAG:  Aphasia  Rationale for Evaluation and Treatment Rehabilitation  SUBJECTIVE:   SUBJECTIVE STATEMENT: "Emotionally I'm feeling much better than the other day." Pt accompanied by: significant other  PERTINENT HISTORY: Pt presented to emergency room at [facility] at 06-30-21 with transient episode of dizziness last known well 9:30 AM that day. 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 Noticed generalized weakness. No focal symptoms reported.  Symptoms lasted about 20 minutes. Also had some incontinence but never lost consciousness.  PAIN:  Are you having pain? No   OBJECTIVE:    STANDARDIZED ASSESSMENTS: QAB: Mild  Summary      Word comprehension 10.00  Sentence comprehension 8.33  Word finding 8.58  Grammatical construction 8.75  Speech motor programming 7.50  Repetition 8.33  Reading 9.17  QAB overall 8.72     PATIENT REPORTED OUTCOME MEASURES (PROM): To be completed next session   TODAY'S TREATMENT:  07/07/21 [name] shared he did his homework all written responses; he worked for 25 minutes and then worked for 50 minutes. SLP educated pt on compensatory measures for anomia. Pt described pictures for SLP with WFL/WNL ability with rare self-corrected errors (3 in this six-picture task). During this task pt did not generate "passenger" during descirption of a bus passenger paying her bus fare. SLP used this situation to tell pt/wife that in words he cannot generate he should verbally and if possible, write, 3 sentences with that word, with differing syntax every sentence. Then pt did so with "passenger". [name] said he and wife "could do that very - that would be simple for [place] to do. (Circumlocution suspected)" Pt's performance with expressive language (verbal) today has improved over expression on date of eval. SLP reviewed pt's written work on homework and he made extremely rare errors with  spelling.  07-05-21 Conversation re: evaluation findings, how wife can assist pt at home/cueing, homework to target and procedure for the homework. When to take breaks with homework, loose framework of speech therapy. SLP demonstrated to wife how to cue pt, and also told pt that it was more helpful to allow pt to work the error out than to provide the word for him, and for pt to work the error out instead of abandon utterance.   PATIENT EDUCATION: Education details: see above Person educated: Patient and Spouse Education method:  Explanation, Demonstration, Verbal cues, and Handouts Education comprehension: verbalized understanding and needs further education   GOALS: Goals reviewed with patient? Yes  SHORT TERM GOALS: Target date: 08/02/2021    Pt will engage in description (picture, object, room, etc)  and other mod complex verbal expression tasks with 85% success and rare min A for accurate language use in 3 sessions Baseline: Goal status: Ongoing  2.  Pt will demo [facility] speech/language in 5 minutes mod complex conversation using compensations, in 3 sessions Baseline:  Goal status: Ongoing  3.  Pt will tell SLP 4 compensatory measures he could use for iimproved verbal expression Baseline:  Goal status: Ongoing  4.  Pt will write 4-5 sentences with modified independence in 3 sessions Baseline:  Goal status: Ongoing  5.  Wife will demo appropriate cueing for pt during 3 sessions Baseline:  Goal status: Ongoing  6.  Pt will complete Communication Effectiveness Survey in first session Baseline:  Goal status: Ongoing  LONG TERM GOALS: Target date: 09/27/2021    Pt will improve his Communication Effectiveness Survey score taken in the last 1-2 sessions of ST, compared to his score in the first session Baseline:  Goal status: Ongoing  2.  Pt will demo [facility] speech/language skills in 15 minutes mod complex/complex conversation with modified independence in 3 sessions Baseline:  Goal status: Ongoing  3.  Pt will demo [facility] auditory comprehension ability in 15 minutes mod complex/complex conversation with modified independence in 3 sessions Baseline:  Goal status: Ongoing   ASSESSMENT:  CLINICAL IMPRESSION: Patient is a 61 y.o. male who was seen today for rehabilitation of receptive and expressive language. Pt scored with mild aphasia on the QAB. His verbal expression skills have improved since evaluation but not to baseline. 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 Skilled ST will assist him in rehabilitating previous language skills to WNL levels.  OBJECTIVE IMPAIRMENTS include awareness, expressive language, receptive language, and aphasia. These impairments are limiting patient from return to work, household responsibilities, and effectively communicating at home and in community. Factors affecting potential to achieve goals and functional outcome are  possibly anxiety/frustration, which pt mentioned today . Patient will benefit from skilled SLP services to address above impairments and improve overall function.  REHAB POTENTIAL: Excellent  PLAN: SLP FREQUENCY: 2x/week  SLP DURATION: 8 weeks  PLANNED INTERVENTIONS: Language facilitation, Environmental controls, Cueing hierachy, Cognitive reorganization, Internal/external aids, Functional tasks, Multimodal communication approach, SLP instruction and feedback, Compensatory strategies, and Patient/family education    [facility], [place] 07/07/2021, 5:14 PM

## 2021-07-08 ENCOUNTER — Other Ambulatory Visit: Payer: Self-pay | Admitting: *Deleted

## 2021-07-08 NOTE — Patient Outreach (Signed)
[facility]) Care Management  07/08/2021  Hyde Sires Sib[place] 1960/11/12 [id number]   RED ON EMMI ALERT - Stroke Day # 1 Date: 5/22 Red Alert Reason: Feeling worse overall?  YES   Outreach attempt #2, unsuccessful, HIPPA compliant voice message left.     Plan: RN CM will send outreach letter and follow up within the next 7 business days.  Kemper Durie, RN, MSN, CCM [facility] Care Management  [facility] Manager [phone]

## 2021-07-12 ENCOUNTER — Ambulatory Visit: Payer: 59

## 2021-07-12 DIAGNOSIS — R4701 Aphasia: Secondary | ICD-10-CM

## 2021-07-12 DIAGNOSIS — R130 Aphagia: Secondary | ICD-10-CM | POA: Diagnosis not present

## 2021-07-12 DIAGNOSIS — R41841 Cognitive communication deficit: Secondary | ICD-10-CM

## 2021-07-12 NOTE — Therapy (Signed)
OUTPATIENT SPEECH LANGUAGE PATHOLOGY TREATMENT NOTE   Patient Name: Timothy Leblanc MRN: [id number] DOB:16-Jul-1960, 61 y.o., male Today's Date: 07/12/2021  PCP: None on File REFERRING PROVIDER: Elmer PickerShafer, Devon, NP    End of Session - 07/12/21 1317     Visit Number 3    Number of Visits 25    Date for SLP Re-Evaluation 09/27/21    SLP Start Time 1148    SLP Stop Time  1233    SLP Time Calculation (min) 45 min    Activity Tolerance Patient tolerated treatment well               Past Medical History:  Diagnosis Date   Diverticulitis 2016   Past Surgical History:  Procedure Laterality Date   COLONOSCOPY  2015   HYDROCELE EXCISION Right 09/08/2019   Procedure: HYDROCELE REPAIR;  Surgeon: Heloise PurpuraBorden, Lester, MD;  Location: [facility];  Service: Urology;  Laterality: Right;   IR 3D INDEPENDENT WKST  07/01/2021   IR ANGIO INTRA EXTRACRAN SEL COM CAROTID INNOMINATE UNI L MOD SED  07/01/2021   IR ANGIO INTRA EXTRACRAN SEL INTERNAL CAROTID UNI R MOD SED  07/01/2021   IR ANGIO VERTEBRAL SEL SUBCLAVIAN INNOMINATE UNI L MOD SED  07/01/2021   IR ANGIO VERTEBRAL SEL VERTEBRAL UNI R MOD SED  07/01/2021   IR US GUIDE VASC ACCESS RIGHT  07/01/2021   VASECTOMY  1996   Patient Active Problem List   Diagnosis Date Noted   Stroke [facility](HCC) 06/30/2021   Stroke (cerebrum) (HCC) 06/30/2021   Acute ischemic stroke (HCC) 06/30/2021    ONSET DATE: 06-30-21   REFERRING DIAG: CVA  THERAPY DIAG:  Aphasia  Rationale for Evaluation and Treatment Rehabilitation  SUBJECTIVE:   SUBJECTIVE STATEMENT: "Putting two words in three sentences was difficult." (Timothy Leblanc) Pt accompanied by: significant other Timothy Leblanc  PERTINENT HISTORY: [facility] with transient episode of dizziness last known well 9:30 AM that day. 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 Noticed generalized weakness. No focal symptoms  reported. Symptoms lasted about 20 minutes. Also had some incontinence but never lost consciousness.  PAIN:  Are you having pain? No   OBJECTIVE:    STANDARDIZED ASSESSMENTS: QAB: Mild  Summary      Word comprehension 10.00  Sentence comprehension 8.33  Word finding 8.58  Grammatical construction 8.75  Speech motor programming 7.50  Repetition 8.33  Reading 9.17  QAB overall 8.72     PATIENT REPORTED OUTCOME MEASURES (PROM): To be completed next session   TODAY'S TREATMENT:  07/12/21 Timothy Leblanc reported Timothy Leblanc got very upset with a simple ST task desribed in "s" statement.  SLP educated pt/wife that if pt gets to frustration level 7/10 to stop task and come back to task later (this task produced 10/10 frustration level). Timothy Leblanc stated Timothy Leblanc' frustration level seems higher at this time, and he is more frequently frustrated over things that wouldn't have frustratd him premorbidly. SLP explained/educated on emotional changes following CVA (pt is not yet 2 weeks post -CVA). SLP explained to Timothy Leblanc why tasks that begin to frsutrate him are essential to complete. Timothy Leblanc described what could be an auditory memory vs. Auditory comprehension vs selective attention deficit, and gives specific examples of MD appointments as examples. SLP told/educated pt that he will do better if he takes notes - his writing is not as legible as it was prior to CVA -  SLP encouraged him to use phone for notes. SLP suggested pt listen to 10 minute TED talks, pause and slow speed to < 1.0 PRN in order to take notes with these. Lastly Timothy Leblanc stated Timothy Leblanc is abandoning utterances  - SLP told Timothy Leblanc to pick 3x/day when she has Timothy Leblanc work to obtain the word he is trying to find- anything more frequent and SLP believes pt will get frustrated.  07/07/21 Timothy Leblanc shared he did his homework all written responses; he worked for 25 minutes and then worked for 50 minutes. SLP educated pt on compensatory measures for anomia. Pt described pictures for  SLP with WFL/WNL ability with rare self-corrected errors (3 in this six-picture task). During this task pt did not generate "passenger" during descirption of a bus passenger paying her bus fare. SLP used this situation to tell pt/wife that in words he cannot generate he should verbally and if possible, write, 3 sentences with that word, with differing syntax every sentence. Then pt did so with "passenger". Timothy Leblanc said he and wife "could do that very - that would be simple for [place] to do. (Circumlocution suspected)" Pt's performance with expressive language (verbal) today has improved over expression on date of eval. SLP reviewed pt's written work on homework and he made extremely rare errors with spelling.  07-05-21 Conversation re: evaluation findings, how wife can assist pt at home/cueing, homework to target and procedure for the homework. When to take breaks with homework, loose framework of speech therapy. SLP demonstrated to wife how to cue pt, and also told pt that it was more helpful to allow pt to work the error out than to provide the word for him, and for pt to work the error out instead of abandon utterance.   PATIENT EDUCATION: Education details: see above note for 07/12/21 Person educated: Patient and Spouse Education method: Explanation, Verbal cues Education comprehension: verbalized understanding and needs further education   GOALS: Goals reviewed with patient? Yes  SHORT TERM GOALS: Target date: 08/02/2021    Pt will engage in description (picture, object, room, etc)  and other mod complex verbal expression tasks with 85% success and rare min A for accurate language use in 3 sessions Baseline: Goal status: Ongoing  2.  Pt will demo [facility] speech/language in 5 minutes mod complex conversation using compensations, in 3 sessions Baseline:  Goal status: Ongoing  3.  Pt will tell SLP 4 compensatory measures he could use for iimproved verbal expression Baseline:  Goal status:  Ongoing  4.  Pt will write 4-5 sentences with modified independence in 3 sessions Baseline:  Goal status: Ongoing  5.  Wife will demo appropriate cueing for pt during 3 sessions Baseline:  Goal status: Ongoing  6.  Pt will complete Communication Effectiveness Survey in first session Baseline:  Goal status: Ongoing  LONG TERM GOALS: Target date: 09/27/2021    Pt will improve his Communication Effectiveness Survey score taken in the last 1-2 sessions of ST, compared to his score in the first session Baseline:  Goal status: Ongoing  2.  Pt will demo [facility] speech/language skills in 15 minutes mod complex/complex conversation with modified independence in 3 sessions Baseline:  Goal status: Ongoing  3.  Pt will demo [facility] auditory comprehension ability in 15 minutes mod complex/complex conversation with modified independence in 3 sessions Baseline:  Goal status: Ongoing   ASSESSMENT:  CLINICAL IMPRESSION: Patient is a 61 y.o. male who was seen today for rehabilitation of receptive and expressive language. His verbal expression  skills have improved since evaluation but not to baseline. Much education was completed today - see "skilled intervention" for details. 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 Skilled ST will assist him in rehabilitating previous language skills to WNL levels.  OBJECTIVE IMPAIRMENTS include awareness, expressive language, receptive language, and aphasia. These impairments are limiting patient from return to work, household responsibilities, and effectively communicating at home and in community. Factors affecting potential to achieve goals and functional outcome are  possibly anxiety/frustration, which pt mentioned today . Patient will benefit from skilled SLP services to address above impairments and improve overall function.  REHAB POTENTIAL: Excellent  PLAN: SLP FREQUENCY: 2x/week  SLP  DURATION: 8 weeks  PLANNED INTERVENTIONS: Language facilitation, Environmental controls, Cueing hierachy, Cognitive reorganization, Internal/external aids, Functional tasks, Multimodal communication approach, SLP instruction and feedback, Compensatory strategies, and Patient/family education    [facility], CCC-SLP 07/12/2021, 1:17 PM

## 2021-07-15 ENCOUNTER — Other Ambulatory Visit: Payer: Self-pay | Admitting: *Deleted

## 2021-07-15 NOTE — Patient Outreach (Signed)
[facility] Care Management  07/15/2021  Timothy Leblanc October 27, 1960 [id number]   RED ON EMMI ALERT - Stroke Day # 1 Date: 5/22 Red Alert Reason: Feeling worse overall?  YES   Outreach attempt #3, unsuccessful, HIPAA compliant voice message left.     Plan: RN CM Will make 4th and final attempt within the next 4 weeks, if remain unsuccessful will close case due to inability to maintain contact.  Valente David, RN, MSN, Newborn Manager [phone]

## 2021-08-01 ENCOUNTER — Encounter: Payer: Self-pay | Admitting: Neurology

## 2021-08-03 ENCOUNTER — Other Ambulatory Visit: Payer: Self-pay | Admitting: *Deleted

## 2021-08-03 NOTE — Patient Outreach (Signed)
[facility]) Care Management  08/03/2021  Raylin Winer Henneke 1960/07/30 [id number]   No response from member after multiple unsuccessful outreach attempts and letter sent.  Will close case at this time due to inability to maintain contact.    Kemper Durie, RN, MSN, CCM [facility] Care Management  [facility] Manager [phone]

## 2021-08-04 ENCOUNTER — Ambulatory Visit: Payer: Self-pay | Admitting: *Deleted

## 2021-08-04 ENCOUNTER — Ambulatory Visit: Payer: 59 | Attending: Neurology

## 2021-08-04 DIAGNOSIS — R4701 Aphasia: Secondary | ICD-10-CM | POA: Insufficient documentation

## 2021-08-04 DIAGNOSIS — R41841 Cognitive communication deficit: Secondary | ICD-10-CM | POA: Insufficient documentation

## 2021-08-04 NOTE — Therapy (Signed)
OUTPATIENT SPEECH LANGUAGE PATHOLOGY TREATMENT NOTE   Patient Name: Timothy Leblanc MRN: [id number] DOB:1960-06-12, 61 y.o., male Today's Date: 08/04/2021  PCP: None on File REFERRING PROVIDER: Janine Ores, NP    End of Session - 08/04/21 1841     Visit Number 4    Number of Visits 25    Date for SLP Re-Evaluation 09/27/21    SLP Start Time 5784    SLP Stop Time  1533    SLP Time Calculation (min) 42 min    Activity Tolerance Patient tolerated treatment well                Past Medical History:  Diagnosis Date   Diverticulitis 2016   Past Surgical History:  Procedure Laterality Date   COLONOSCOPY  2015   HYDROCELE EXCISION Right 09/08/2019   Procedure: HYDROCELE REPAIR;  Surgeon: Raynelle Bring, MD;  Location: [facility];  Service: Urology;  Laterality: Right;   IR 3D INDEPENDENT WKST  07/01/2021   IR ANGIO INTRA EXTRACRAN SEL COM CAROTID INNOMINATE UNI L MOD SED  07/01/2021   IR ANGIO INTRA EXTRACRAN SEL INTERNAL CAROTID UNI R MOD SED  07/01/2021   IR ANGIO VERTEBRAL SEL SUBCLAVIAN INNOMINATE UNI L MOD SED  07/01/2021   IR ANGIO VERTEBRAL SEL VERTEBRAL UNI R MOD SED  07/01/2021   IR US GUIDE VASC ACCESS RIGHT  07/01/2021   VASECTOMY  1996   Patient Active Problem List   Diagnosis Date Noted   Stroke ([place]) 06/30/2021   Stroke (cerebrum) ([place]) 06/30/2021   Acute ischemic stroke ([place]) 06/30/2021    ONSET DATE: 06-30-21   REFERRING DIAG: CVA  THERAPY DIAG:  Aphasia Cognitive Communication Deficit  Rationale for Evaluation and Treatment Rehabilitation  SUBJECTIVE:   SUBJECTIVE STATEMENT: "I kind of took it (homework) to the next step - I listened to the whole thing and then went back and see if I missed anything." Pt has appointment to consult with psychiatrist in July. Pt accompanied by: significant other Timothy Leblanc  PERTINENT HISTORY: Pt presented to emergency room at [facility] at 06-30-21 with transient episode of dizziness last known well 9:30 AM  that day. 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 Noticed generalized weakness. No focal symptoms reported. Symptoms lasted about 20 minutes. Also had some incontinence but never lost consciousness.  PAIN:  Are you having pain? No   OBJECTIVE:    STANDARDIZED ASSESSMENTS: QAB: Mild  Summary      Word comprehension 10.00  Sentence comprehension 8.33  Word finding 8.58  Grammatical construction 8.75  Speech motor programming 7.50  Repetition 8.33  Reading 9.17  QAB overall 8.72     PATIENT REPORTED OUTCOME MEASURES (PROM): Pt took this today (CES) and was instructed to fill this out as he thought about his speaking during the first day of therapy.   TREATMENT:  08-04-21 SLP told Timothy Leblanc SLP saw her inquiry to him this AM for a counselor/ Cognition (20 minutes): 20-minute discussion today about pt's homework since previous session. No anomia noted. Pt did ~30 minute auditory information (TED Talks) -story-based- and pt had a long discussion about this with wife, in detail. He is using ear buds and is unsure if he could pay attention as well with speakers, and states he may not recall as many details with "neutral" information that he has little/no interest. Pt further described what could be attention deficits vs sx of depression as  limited attention to written material. Pt to problem solve with and without ear buds and TED talks he would find interesting and not interesting and report back to SLP.  Speech tx: SLP engaged pt in additional 20-minute additional detailed description of depression and pt's homework while SLP assessed pt's skills with verbal expression. SLP did not detect any anomia. Wife and pt agree pt is no longer having anomia instances in the past week.   07/12/21 Timothy Leblanc reported Timothy Leblanc got very upset with a simple ST task desribed in "s" statement.  SLP educated pt/wife that if pt gets to frustration level 7/10 to stop  task and come back to task later (this task produced 10/10 frustration level). Timothy Leblanc stated chris' frustration level seems higher at this time, and he is more frequently frustrated over things that wouldn't have frustratd him premorbidly. SLP explained/educated on emotional changes following CVA (pt is not yet 2 weeks post -CVA). SLP explained to Timothy Leblanc why tasks that begin to frsutrate him are essential to complete. Timothy Leblanc described what could be an auditory memory vs. Auditory comprehension vs selective attention deficit, and gives specific examples of MD appointments as examples. SLP told/educated pt that he will do better if he takes notes - his writing is not as legible as it was prior to CVA - SLP encouraged him to use phone for notes. SLP suggested pt listen to 10 minute TED talks, pause and slow speed to < 1.0 PRN in order to take notes with these. Lastly Timothy Leblanc stated Timothy Leblanc is abandoning utterances  - SLP told Timothy Leblanc to pick 3x/day when she has Timothy Leblanc work to obtain the word he is trying to find- anything more frequent and SLP believes pt will get frustrated.  07/07/21 Timothy Leblanc shared he did his homework all written responses; he worked for 25 minutes and then worked for 50 minutes. SLP educated pt on compensatory measures for anomia. Pt described pictures for SLP with WFL/WNL ability with rare self-corrected errors (3 in this six-picture task). During this task pt did not generate "passenger" during descirption of a bus passenger paying her bus fare. SLP used this situation to tell pt/wife that in words he cannot generate he should verbally and if possible, write, 3 sentences with that word, with differing syntax every sentence. Then pt did so with "passenger". Timothy Leblanc said he and wife "could do that very - that would be simple for [place] to do. (Circumlocution suspected)" Pt's performance with expressive language (verbal) today has improved over expression on date of eval. SLP reviewed pt's written work on homework and he  made extremely rare errors with spelling.  07-05-21 Conversation re: evaluation findings, how wife can assist pt at home/cueing, homework to target and procedure for the homework. When to take breaks with homework, loose framework of speech therapy. SLP demonstrated to wife how to cue pt, and also told pt that it was more helpful to allow pt to work the error out than to provide the word for him, and for pt to work the error out instead of abandon utterance.   PATIENT EDUCATION: Education details: see above note for 07/12/21 Person educated: Patient and Spouse Education method: Explanation, Verbal cues Education comprehension: verbalized understanding and needs further education   GOALS: Goals reviewed with patient? Yes  SHORT TERM GOALS: Target date: 08/02/2021    Pt will engage in description (picture, object, room, etc)  and other mod complex verbal expression tasks with 85% success and rare min A for accurate language use in  3 sessions Baseline:  08-04-21 Goal status: Ongoing  2.  Pt will demo [facility] speech/language in 5 minutes mod complex conversation using compensations, in 3 sessions Baseline:  Goal status: Deferred  3.  Pt will tell SLP 4 compensatory measures he could use for iimproved verbal expression Baseline:  Goal status: Deferred  4.  Pt will write 4-5 sentences with modified independence in 3 sessions Baseline:  Goal status: Ongoing  5.  Wife will demo appropriate cueing for pt during 3 sessions Baseline:  Goal status: Ongoing  6.  Pt will complete Communication Effectiveness Survey in first session Baseline:  Goal status: Not met - SLP provided on 08-04-21  LONG TERM GOALS: Target date: 09/27/2021    Pt will improve his Communication Effectiveness Survey score taken in the last 1-2 sessions of ST, compared to his initial score Baseline:  Goal status: revised  2.  Pt will demo [facility] speech/language skills in 15 minutes mod complex/complex conversation with  modified independence in 3 sessions Baseline:  Goal status: Ongoing  3.  Pt will demo [facility] auditory comprehension ability in 15 minutes mod complex/complex conversation with modified independence in 3 sessions Baseline:  Goal status: Ongoing   ASSESSMENT:  CLINICAL IMPRESSION: Patient is a 61 y.o. male who was seen today for rehabilitation of receptive and expressive language. SLP unsure if pt's deficits are based in his sx of depression or in cognitive linguistics. He will problem solve as detailed above in "treatment" to provide SLP more information. Goals may beed to be modified to more cognitive - based next session. Verbal expression skills are reported as baseline. 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 SLP is unsure at this time if pt could return to work at his previous skill level. Skilled ST will assist him improving skills to WFL/WNL levels. Assumed pt may be out of work for another 4-6 weeks.  OBJECTIVE IMPAIRMENTS include awareness, expressive language, receptive language, and aphasia. These impairments are limiting patient from return to work, household responsibilities, and effectively communicating at home and in community. Factors affecting potential to achieve goals and functional outcome are  possibly depression . Patient will benefit from skilled SLP services to address above impairments and improve overall function.  REHAB POTENTIAL: Excellent  PLAN: SLP FREQUENCY: 2x/week  SLP DURATION: 8 weeks  PLANNED INTERVENTIONS: Language facilitation, Environmental controls, Cueing hierachy, Cognitive reorganization, Internal/external aids, Functional tasks, Multimodal communication approach, SLP instruction and feedback, Compensatory strategies, and Patient/family education    [place], [place] 08/04/2021, 7:23 PM

## 2021-08-08 ENCOUNTER — Ambulatory Visit: Payer: 59

## 2021-08-08 DIAGNOSIS — R4701 Aphasia: Secondary | ICD-10-CM | POA: Diagnosis not present

## 2021-08-08 DIAGNOSIS — R41841 Cognitive communication deficit: Secondary | ICD-10-CM

## 2021-08-08 NOTE — Therapy (Signed)
OUTPATIENT SPEECH LANGUAGE PATHOLOGY TREATMENT NOTE   Patient Name: Timothy Leblanc MRN: [id number] DOB:1960-12-15, 61 y.o., male Today's Date: 08/08/2021  PCP: None on File REFERRING PROVIDER: Elmer Picker, NP    End of Session - 08/08/21 1237     Visit Number 5    Number of Visits 25    Date for SLP Re-Evaluation 09/27/21    SLP Start Time 1105    SLP Stop Time  1146    SLP Time Calculation (min) 41 min    Activity Tolerance Patient tolerated treatment well                 Past Medical History:  Diagnosis Date   Diverticulitis 2016   Past Surgical History:  Procedure Laterality Date   COLONOSCOPY  2015   HYDROCELE EXCISION Right 09/08/2019   Procedure: HYDROCELE REPAIR;  Surgeon: Heloise Purpura, MD;  Location: [facility];  Service: Urology;  Laterality: Right;   IR 3D INDEPENDENT WKST  07/01/2021   IR ANGIO INTRA EXTRACRAN SEL COM CAROTID INNOMINATE UNI L MOD SED  07/01/2021   IR ANGIO INTRA EXTRACRAN SEL INTERNAL CAROTID UNI R MOD SED  07/01/2021   IR ANGIO VERTEBRAL SEL SUBCLAVIAN INNOMINATE UNI L MOD SED  07/01/2021   IR ANGIO VERTEBRAL SEL VERTEBRAL UNI R MOD SED  07/01/2021   IR US GUIDE VASC ACCESS RIGHT  07/01/2021   VASECTOMY  1996   Patient Active Problem List   Diagnosis Date Noted   Stroke [facility]) 06/30/2021   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 Noticed  generalized weakness. No focal symptoms reported. Symptoms lasted about 20 minutes. Also had some incontinence but never lost consciousness.  PAIN:  Are you having pain? No   OBJECTIVE:    STANDARDIZED ASSESSMENTS: QAB: Mild  Summary      Word comprehension 10.00  Sentence comprehension 8.33  Word finding 8.58  Grammatical construction 8.75  Speech motor programming 7.50  Repetition 8.33  Reading 9.17  QAB overall 8.72     PATIENT REPORTED OUTCOME MEASURES (PROM): Pt brought this back today (CES) and scored 21/32, considering thoughts he had about communicating when he arrived to ST the first time.   TREATMENT:  08/08/21: Thayer Ohm told SLP that there was one TED talk with use of speakers that he was less/not interested in, that he missed details. All others appeared WNL. [name] said during that talk he was pacing around and maybe distracted by what he was looking at. SLP suggested pt consider the use of a dedicated listening device/receiver for meetings upon his return to work if he finds it necessary. Thayer Ohm and [name] think the difference is attention. Thayer Ohm voiced concern over detailed work in databases and other places he will need to search upon return to work. SLP reiterated the limitations to OP ST in this regard but suggested Thayer Ohm look at data online in areas he is interested in and practice this transfer  of information this way. He agreed.  08-04-21 SLP told [name] SLP saw her inquiry to him this AM for a counselor/ Cognition (20 minutes): 20-minute discussion today about pt's homework since previous session. No anomia noted. Pt did ~30 minute auditory information (TED Talks) -story-based- and pt had a long discussion about this with wife, in detail. He is using ear buds and is unsure if he could pay attention as well with speakers, and states he may not recall as many details with "neutral" information that he has little/no interest. Pt further described what could be attention deficits vs  sx of depression as limited attention to written material. Pt to problem solve with and without ear buds and TED talks he would find interesting and not interesting and report back to SLP.  Speech tx: SLP engaged pt in additional 20-minute additional detailed description of depression and pt's homework while SLP assessed pt's skills with verbal expression. SLP did not detect any anomia. Wife and pt agree pt is no longer having anomia instances in the past week.   07/12/21 [name] reported Thayer Ohm got very upset with a simple ST task desribed in "s" statement.  SLP educated pt/wife that if pt gets to frustration level 7/10 to stop task and come back to task later (this task produced 10/10 frustration level). [name] stated chris' frustration level seems higher at this time, and he is more frequently frustrated over things that wouldn't have frustratd him premorbidly. SLP explained/educated on emotional changes following CVA (pt is not yet 2 weeks post -CVA). SLP explained to Thayer Ohm why tasks that begin to frsutrate him are essential to complete. Thayer Ohm described what could be an auditory memory vs. Auditory comprehension vs selective attention deficit, and gives specific examples of MD appointments as examples. SLP told/educated pt that he will do better if he takes notes - his writing is not as legible as it was prior to CVA - SLP encouraged him to use phone for notes. SLP suggested pt listen to 10 minute TED talks, pause and slow speed to < 1.0 PRN in order to take notes with these. Lastly [name] stated Thayer Ohm is abandoning utterances  - SLP told [name] to pick 3x/day when she has Thayer Ohm work to obtain the word he is trying to find- anything more frequent and SLP believes pt will get frustrated.  07/07/21 Thayer Ohm shared he did his homework all written responses; he worked for 25 minutes and then worked for 50 minutes. SLP educated pt on compensatory measures for anomia. Pt described pictures for SLP with WFL/WNL ability with rare  self-corrected errors (3 in this six-picture task). During this task pt did not generate "passenger" during descirption of a bus passenger paying her bus fare. SLP used this situation to tell pt/wife that in words he cannot generate he should verbally and if possible, write, 3 sentences with that word, with differing syntax every sentence. Then pt did so with "passenger". Thayer Ohm said he and wife "could do that very - that would be simple for [place] to do. (Circumlocution suspected)" Pt's performance with expressive language (verbal) today has improved over expression on date of eval. SLP reviewed pt's written work on homework and he made extremely rare errors with spelling.   PATIENT EDUCATION: Education details: see above note for today Person educated: Patient and Spouse Education method: Explanation, Verbal cues Education comprehension: verbalized understanding and needs further education   GOALS: Goals reviewed with patient? Yes  SHORT TERM GOALS: Target date: 08/02/2021  Pt will engage in description (picture, object, room, etc)  and other mod complex verbal expression tasks with 85% success and rare min A for accurate language use in 3 sessions Baseline:  08-04-21, 08-08-21 Goal status: Ongoing  2.  Pt will demo [facility] speech/language in 5 minutes mod complex conversation using compensations, in 3 sessions Baseline:  Goal status: Deferred  3.  Pt will tell SLP 4 compensatory measures he could use for iimproved verbal expression Baseline:  Goal status: Deferred  4.  Pt will write 4-5 sentences with modified independence in 3 sessions Baseline:  Goal status: Ongoing  5.  Wife will demo appropriate cueing for pt during 3 sessions Baseline:  Goal status: Ongoing  6.  Pt will complete Communication Effectiveness Survey in first session Baseline:  Goal status: Not met - SLP provided on 08-04-21  7. Pt will demo [facility] sustained/selective attention skills for 20 minute mod complex topics  (conversational or other), in 2 sessions  Baseline:  Goal Status: Initial  LONG TERM GOALS: Target date: 09/27/2021    Pt will improve his Communication Effectiveness Survey score taken in the last 1-2 sessions of ST, compared to his initial score Baseline:  Goal status: Ongoing  2.  Pt will demo [facility] speech/language skills in 15 minutes mod complex/complex conversation with modified independence in 3 sessions Baseline: 08-08-21 Goal status: Ongoing  3.  Pt will demo [facility] auditory comprehension ability in 15 minutes mod complex/complex conversation with modified independence in 3 sessions Baseline: 08-05-21 Goal status: Ongoing  4.  Pt will demo WFL alternating and/or divided attention skills for 20-25 minute mod complex topics (conversational or other), in 3 sessions  Baseline:  Goal Status: Initial   ASSESSMENT:  CLINICAL IMPRESSION: Patient is a 61 y.o. male who was seen today for rehabilitation of receptive and expressive language and cognitive linguistics. Pt and SLP agree deficits include attention. Verbal expression skills are reported as baseline. 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 SLP is unsure at this time if pt could return to work at his previous skill level. Skilled ST will assist him improving skills to WFL/WNL levels. Assumed pt may be out of work for another 4-6 weeks.  OBJECTIVE IMPAIRMENTS include awareness, expressive language, receptive language, and aphasia. These impairments are limiting patient from return to work, household responsibilities, and effectively communicating at home and in community. Factors affecting potential to achieve goals and functional outcome are  possibly depression . Patient will benefit from skilled SLP services to address above impairments and improve overall function.  REHAB POTENTIAL: Excellent  PLAN: SLP FREQUENCY: 2x/week  SLP DURATION: 8  weeks  PLANNED INTERVENTIONS: Language facilitation, Environmental controls, Cueing hierachy, Cognitive reorganization, Internal/external aids, Functional tasks, Multimodal communication approach, SLP instruction and feedback, Compensatory strategies, and Patient/family education    [facility], CCC-SLP 08/08/2021, 12:38 PM

## 2021-08-11 ENCOUNTER — Ambulatory Visit: Payer: 59

## 2021-08-11 DIAGNOSIS — R4701 Aphasia: Secondary | ICD-10-CM

## 2021-08-11 DIAGNOSIS — R41841 Cognitive communication deficit: Secondary | ICD-10-CM

## 2021-08-11 NOTE — Patient Instructions (Signed)
Divided Attention ideas - any can be done together  Playing a card game either by yourself or with someone  Playing a board game with someone  Sorting playing cards  Writing down commercials on TV or the radio  Writing down news stories on TV or radio  Working outside with yard work, gardening, Catering manager.  [place]  Recite state names and capitals  Complete long division or 2 or 3 digit multiplication problems  Listen to a Therapist, sports (i.e., YouTube), to online news (NPR, CNN, etc.), or to a family member, and give pertinent information from the speech, news story, or conversation after it is over  Empty the dishwasher  Complete paper/pencil puzzles (sudoku, crosswords, word search)  Completing puzzles online (www.lumosity.com -computer/tablet based, or www.constanttherapy.com - app based)  Therapy worksheets/exercises  Put together a puzzle  State coins needed to make a certain change amount  Have someone read a portion of a novel to you, and you give a summary    Start at a level that you are challenged by, but also where you can see some success

## 2021-08-11 NOTE — Therapy (Signed)
OUTPATIENT SPEECH LANGUAGE PATHOLOGY TREATMENT NOTE   Patient Name: Timothy Leblanc MRN: [id number] DOB:Jun 22, 1960, 61 y.o., male Today's Date: 08/11/2021  PCP: None on File REFERRING PROVIDER: Janine Ores, NP          Past Medical History:  Diagnosis Date   Diverticulitis 2016   Past Surgical History:  Procedure Laterality Date   COLONOSCOPY  2015   HYDROCELE EXCISION Right 09/08/2019   Procedure: HYDROCELE REPAIR;  Surgeon: Raynelle Bring, MD;  Location: [facility];  Service: Urology;  Laterality: Right;   IR 3D INDEPENDENT WKST  07/01/2021   IR ANGIO INTRA EXTRACRAN SEL COM CAROTID INNOMINATE UNI L MOD SED  07/01/2021   IR ANGIO INTRA EXTRACRAN SEL INTERNAL CAROTID UNI R MOD SED  07/01/2021   IR ANGIO VERTEBRAL SEL SUBCLAVIAN INNOMINATE UNI L MOD SED  07/01/2021   IR ANGIO VERTEBRAL SEL VERTEBRAL UNI R MOD SED  07/01/2021   IR US GUIDE VASC ACCESS RIGHT  07/01/2021   VASECTOMY  1996   Patient Active Problem List   Diagnosis Date Noted   Stroke ([place]) 06/30/2021   Stroke (cerebrum) ([place]) 06/30/2021   Acute ischemic stroke ([place]) 06/30/2021    ONSET DATE: 06-30-21   REFERRING DIAG: CVA  THERAPY DIAG:  Aphasia Cognitive Communication Deficit  Rationale for Evaluation and Treatment Rehabilitation  SUBJECTIVE:   SUBJECTIVE STATEMENT: "Well, I can't multitask. I found that out." Pt accompanied by: significant other [name]  PERTINENT HISTORY: Pt presented to emergency room at [facility] at 06-30-21 with transient episode of dizziness last known well 9:30 AM that day. 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 Noticed generalized weakness. No focal symptoms reported. Symptoms lasted about 20 minutes. Also had some incontinence but never lost consciousness.  PAIN:  Are you having pain? No   OBJECTIVE:    STANDARDIZED ASSESSMENTS: QAB: Mild  Summary      Word comprehension 10.00  Sentence  comprehension 8.33  Word finding 8.58  Grammatical construction 8.75  Speech motor programming 7.50  Repetition 8.33  Reading 9.17  QAB overall 8.72     PATIENT REPORTED OUTCOME MEASURES (PROM): Pt brought this back today (CES) and scored 21/32, considering thoughts he had about communicating when he arrived to ST the first time.   TREATMENT:  08/11/21: Pt reports in performing cognitive linguistic tasks in the last 2-3 days he found that divided attention with verbal and auditory information appears less robust than premorbidly. Pt used compensation of alternating attention instead, successfully. Pt found that he had to prioritize tasks instead of multitask, and did this successfully as well. [name] also describes working memory as a more challenging and salient deficit for him, due to eventual return to work. Discussion/education on definition of working memory, and possibility of goals to target this during therapy. Pt would like to do so. SLP provided education about some divided attention tasks he could perform at home and about Constant Therapy so that pt can work at home targeting his deficit areas.   6/26/23[name] told SLP that there was one TED talk with use of speakers that he was less/not interested in, that he missed details. All others appeared WNL. [name] said during that talk he was pacing around and maybe distracted by what he was looking at. SLP suggested pt consider the use of a dedicated listening device/receiver for meetings upon his return to work if he finds it necessary. [name] and [name] think  the difference is attention. [name] voiced concern over detailed work in [place] and other places he will need to search upon return to work. SLP reiterated the limitations to OP ST in this regard but suggested [name] look at data online in areas he is interested in and practice this transfer of information this way. He agreed.  08-04-21 SLP told [name] SLP saw her inquiry to him this AM for a  counselor/ Cognition (20 minutes): 20-minute discussion today about pt's homework since previous session. No anomia noted. Pt did ~30 minute auditory information (TED Talks) -story-based- and pt had a long discussion about this with wife, in detail. He is using ear buds and is unsure if he could pay attention as well with speakers, and states he may not recall as many details with "neutral" information that he has little/no interest. Pt further described what could be attention deficits vs sx of depression as limited attention to written material. Pt to problem solve with and without ear buds and TED talks he would find interesting and not interesting and report back to SLP.  Speech tx: SLP engaged pt in additional 20-minute additional detailed description of depression and pt's homework while SLP assessed pt's skills with verbal expression. SLP did not detect any anomia. Wife and pt agree pt is no longer having anomia instances in the past week.   07/12/21 [name] reported [name] got very upset with a simple ST task desribed in "s" statement.  SLP educated pt/wife that if pt gets to frustration level 7/10 to stop task and come back to task later (this task produced 10/10 frustration level). [name] stated chris' frustration level seems higher at this time, and he is more frequently frustrated over things that wouldn't have frustratd him premorbidly. SLP explained/educated on emotional changes following CVA (pt is not yet 2 weeks post -CVA). SLP explained to [name] why tasks that begin to frsutrate him are essential to complete. [name] described what could be an auditory memory vs. Auditory comprehension vs selective attention deficit, and gives specific examples of MD appointments as examples. SLP told/educated pt that he will do better if he takes notes - his writing is not as legible as it was prior to CVA - SLP encouraged him to use phone for notes. SLP suggested pt listen to 10 minute TED talks, pause and slow speed  to < 1.0 PRN in order to take notes with these. Lastly [name] stated [name] is abandoning utterances  - SLP told [name] to pick 3x/day when she has [name] work to obtain the word he is trying to find- anything more frequent and SLP believes pt will get frustrated.  07/07/21 [name] shared he did his homework all written responses; he worked for 25 minutes and then worked for 50 minutes. SLP educated pt on compensatory measures for anomia. Pt described pictures for SLP with WFL/WNL ability with rare self-corrected errors (3 in this six-picture task). During this task pt did not generate "passenger" during descirption of a bus passenger paying her bus fare. SLP used this situation to tell pt/wife that in words he cannot generate he should verbally and if possible, write, 3 sentences with that word, with differing syntax every sentence. Then pt did so with "passenger". [name] said he and wife "could do that very - that would be simple for [place] to do. (Circumlocution suspected)" Pt's performance with expressive language (verbal) today has improved over expression on date of eval. SLP reviewed pt's written work on homework and he made extremely  rare errors with spelling.   PATIENT EDUCATION: Education details: see above note for today Person educated: Patient and Spouse Education method: Explanation Education comprehension: verbalized understanding   GOALS: Goals reviewed with patient? Yes  SHORT TERM GOALS: Target date: 08/02/2021    Pt will engage in description (picture, object, room, etc)  and other mod complex verbal expression tasks with 85% success and rare min A for accurate language use in 3 sessions Baseline:  08-04-21, 08-08-21 Goal status: Partially met  2.  Pt will demo [facility] speech/language in 5 minutes mod complex conversation using compensations, in 3 sessions Baseline:  Goal status: Deferred  3.  Pt will tell SLP 4 compensatory measures he could use for iimproved verbal expression Baseline:   Goal status: Deferred  4.  Pt will write 4-5 sentences with modified independence in 3 sessions Baseline:  Goal status: Goal d/c'd - Pt now without anomia  5.  Wife will demo appropriate cueing for pt during 3 sessions Baseline:  Goal status: Goal d/c'd - Pt now without anomia  6.  Pt will complete Communication Effectiveness Survey in first session Baseline:  Goal status: Not met - SLP provided on 08-04-21  7. Pt will demo [facility] sustained/selective attention skills for 20 minute mod complex topics (conversational or other), in 2 sessions  Baseline:  Goal Status: Paritally Met  LONG TERM GOALS: Target date: 09/27/2021    Pt will improve his Communication Effectiveness Survey score taken in the last 1-2 sessions of ST, compared to his initial score Baseline:  Goal status: Ongoing  2.  Pt will demo [facility] speech/language skills in 15 minutes mod complex/complex conversation with modified independence in 3 sessions Baseline: 08-08-21, 08/11/21 Goal status: Ongoing  3.  Pt will demo [facility] auditory comprehension ability in 15 minutes mod complex/complex conversation with modified independence in 3 sessions Baseline: 08-05-21, 08/11/21 Goal status: Ongoing  4.  Pt will demo WFL alternating and/or divided attention skills for 20-25 minute mod complex topics (conversational or other), in 3 sessions  Baseline:  Goal Status: Ongoing  5.  Pt will demo [facility] working memory for 20-25 minutes of auditory information (business [employer]), in 3 sessions  Baseline:  Goal Status: Initial   ASSESSMENT:  CLINICAL IMPRESSION: Patient is a 61 y.o. male who was seen today for rehabilitation of receptive language and cognitive linguistics. Pt and SLP agree deficits include attention - and pt ID'd divided attention as challenging in doing his homework since last session. Pt also told SLP of his difficulty taking notes and cont to listen in order to capture notable points to cont to write down. Pt requires  these working memory skills for meetings and conferences for his occupation. Verbal expression skills are reported as baseline. 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 SLP is unsure at this time if pt could return to work at his previous skill level. He will likely require modifications to his workflow upon his return to work. Skilled ST will assist him improving skills to WFL/WNL levels. Assumed pt may be out of work for another 4-6 weeks.  OBJECTIVE IMPAIRMENTS include awareness, expressive language, receptive language, and aphasia. These impairments are limiting patient from return to work, household responsibilities, and effectively communicating at home and in community. Factors affecting potential to achieve goals and functional outcome are  possibly depression . Patient will benefit from skilled SLP services to address above impairments and improve overall function.  REHAB POTENTIAL: Excellent  PLAN: SLP FREQUENCY: 2x/week  SLP DURATION: 8 weeks  PLANNED INTERVENTIONS: Language facilitation, Environmental controls, Cueing hierachy, Cognitive reorganization, Internal/external aids, Functional tasks, Multimodal communication approach, SLP instruction and feedback, Compensatory strategies, and Patient/family education    [facility], [place] 08/11/2021, 10:40 AM

## 2021-08-15 ENCOUNTER — Ambulatory Visit: Payer: 59 | Attending: Neurology

## 2021-08-15 DIAGNOSIS — R4701 Aphasia: Secondary | ICD-10-CM | POA: Diagnosis present

## 2021-08-15 DIAGNOSIS — R41841 Cognitive communication deficit: Secondary | ICD-10-CM | POA: Insufficient documentation

## 2021-08-15 NOTE — Therapy (Signed)
OUTPATIENT SPEECH LANGUAGE PATHOLOGY TREATMENT NOTE   Patient Name: Timothy Leblanc MRN: [id number] DOB:[birth year], 61 y.o., male Today's Date: 08/15/2021  PCP: None on File REFERRING PROVIDER: Janine Ores, NP    End of Session - 08/15/21 1218     Visit Number 7    Number of Visits 25    Date for SLP Re-Evaluation 09/27/21    SLP Start Time 1104    SLP Stop Time  1146    SLP Time Calculation (min) 42 min    Activity Tolerance Patient tolerated treatment well                  Past Medical History:  Diagnosis Date   Diverticulitis 2016   Past Surgical History:  Procedure Laterality Date   COLONOSCOPY  2015   HYDROCELE EXCISION Right 09/08/2019   Procedure: HYDROCELE REPAIR;  Surgeon: Raynelle Bring, MD;  Location: [facility];  Service: Urology;  Laterality: Right;   IR 3D INDEPENDENT WKST  07/01/2021   IR ANGIO INTRA EXTRACRAN SEL COM CAROTID INNOMINATE UNI L MOD SED  07/01/2021   IR ANGIO INTRA EXTRACRAN SEL INTERNAL CAROTID UNI R MOD SED  07/01/2021   IR ANGIO VERTEBRAL SEL SUBCLAVIAN INNOMINATE UNI L MOD SED  07/01/2021   IR ANGIO VERTEBRAL SEL VERTEBRAL UNI R MOD SED  07/01/2021   IR US GUIDE VASC ACCESS RIGHT  07/01/2021   VASECTOMY  1996   Patient Active Problem List   Diagnosis Date Noted   Stroke ([place]) 06/30/2021   Stroke (cerebrum) ([place]) 06/30/2021   Acute ischemic stroke ([place]) 06/30/2021    ONSET DATE: 06-30-21   REFERRING DIAG: CVA  THERAPY DIAG:  Aphasia Cognitive Communication Deficit  Rationale for Evaluation and Treatment Rehabilitation  SUBJECTIVE:   SUBJECTIVE STATEMENT: "We didn't do (the divided attention) tasks. We did the (working memory) worksheets." Pt accompanied by: significant other [name]  PERTINENT HISTORY: Pt presented to emergency room at [facility] at 06-30-21 with transient episode of dizziness last known well 9:30 AM that day. 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 Noticed generalized weakness. No focal symptoms reported. Symptoms lasted about 20 minutes. Also had some incontinence but never lost consciousness.  PAIN:  Are you having pain? No   OBJECTIVE:    STANDARDIZED ASSESSMENTS: QAB: Mild  Summary      Word comprehension 10.00  Sentence comprehension 8.33  Word finding 8.58  Grammatical construction 8.75  Speech motor programming 7.50  Repetition 8.33  Reading 9.17  QAB overall 8.72     PATIENT REPORTED OUTCOME MEASURES (PROM): Pt scored 21/32, considering thoughts he had about communicating when he arrived to ST the first time.   TREATMENT:  08/15/21: Pt did some problem solving in regards to the therapy tasks he completed over the weekend: realized 3 words for working memory was not difficult but 4 words were definitely more difficult. Nouns were easier than verbs or other words.  "I just can't do the TED talks without stopping to write the notes." Pt is thinking about making less detailed notes - keywords, as opposed to very detailed notes. SLP suggested stopping every 2 minutes to take notes and encouraged pt that a meeting will not be 20 minutes solid monologue and if so, like a presentation, pt will have slides/notes already so he will make keyword notes. 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 Pt used compensation of alternating attention instead, successfully. Pt found that he had to prioritize tasks instead of multitask, and did this successfully as well. CGerald Stabsalso describes working memory as a more challenging and  salient deficit for him, due to eventual return to work. Discussion/education on definition of working memory, and possibility of goals to target this during therapy. Pt would like to do so. SLP provided education about some divided attention tasks he could perform at home and about Constant Therapy so that pt can work at home targeting his deficit areas.   08/08/21:Gerald Stabstold SLP that there was one TED talk with use of speakers that he was less/not interested in, that he missed details. All others appeared WNL. [name] said during that talk he was pacing around and maybe distracted by what he was looking at. SLP suggested pt consider the use of a dedicated listening device/receiver for meetings upon his return to work if he finds it necessary. CGerald Stabsand [name] think the difference is attention. CGerald Stabsvoiced concern over detailed work in [place] other places he will need to search upon return to work. SLP reiterated the limitations to OP ST in this regard but suggested CGerald Stabslook at data online in areas he is interested in and practice this transfer of information this way. He agreed.  08-04-21 SLP told [name] SLP saw her inquiry to him this AM for a counselor/ Cognition (20 minutes): 20-minute discussion today about pt's homework since previous session. No anomia noted. Pt did ~30 minute auditory information (TED Talks) -story-based- and pt had a long discussion about this with wife, in detail. He is using ear buds and is unsure if he could pay attention as well with speakers, and states he may not recall as many details with "neutral" information that he has little/no interest. Pt further described what could be attention deficits vs sx of depression as limited attention to written material. Pt to problem solve with and without ear buds and TED talks he would find interesting and not interesting and report back to SLP.  Speech tx: SLP engaged pt in additional 20-minute additional detailed description of  depression and pt's homework while SLP assessed pt's skills with verbal expression. SLP did not detect any anomia. Wife and pt agree pt is no longer having anomia instances in the past week.   07/12/21 [name] reported CGerald Stabsgot very upset with a simple ST task desribed in "s" statement.  SLP educated pt/wife that if pt gets to frustration level 7/10 to stop task and come back to task later (this task produced 10/10 frustration level). [name] stated chris' frustration level seems higher at this time, and he is more frequently frustrated over things that wouldn't have frustratd him premorbidly. SLP explained/educated on emotional changes following CVA (pt is not yet 2 weeks post -CVA). SLP explained to CGerald Stabswhy tasks that begin to frsutrate him are essential to complete. CGerald Stabsdescribed what could be an auditory memory vs. Auditory comprehension vs selective attention deficit, and gives specific examples of MD appointments as examples. SLP told/educated pt that he will do better if he takes notes - his writing is not as legible as it  was prior to CVA - SLP encouraged him to use phone for notes. SLP suggested pt listen to 10 minute TED talks, pause and slow speed to < 1.0 PRN in order to take notes with these. Lastly [name] stated [name] is abandoning utterances  - SLP told [name] to pick 3x/day when she has [name] work to obtain the word he is trying to find- anything more frequent and SLP believes pt will get frustrated.  07/07/21 [name] shared he did his homework all written responses; he worked for 25 minutes and then worked for 50 minutes. SLP educated pt on compensatory measures for anomia. Pt described pictures for SLP with WFL/WNL ability with rare self-corrected errors (3 in this six-picture task). During this task pt did not generate "passenger" during descirption of a bus passenger paying her bus fare. SLP used this situation to tell pt/wife that in words he cannot generate he should verbally and if possible, write, 3  sentences with that word, with differing syntax every sentence. Then pt did so with "passenger". [name] said he and wife "could do that very - that would be simple for [place] to do. (Circumlocution suspected)" Pt's performance with expressive language (verbal) today has improved over expression on date of eval. SLP reviewed pt's written work on homework and he made extremely rare errors with spelling.   PATIENT EDUCATION: Education details: see above note for today Person educated: Patient and Spouse Education method: Explanation Education comprehension: verbalized understanding   GOALS: Goals reviewed with patient? Yes  SHORT TERM GOALS: Target date: 08/02/2021    Pt will engage in description (picture, object, room, etc)  and other mod complex verbal expression tasks with 85% success and rare min A for accurate language use in 3 sessions Baseline:  08-04-21, 08-08-21 Goal status: Partially met  2.  Pt will demo [facility] speech/language in 5 minutes mod complex conversation using compensations, in 3 sessions Baseline:  Goal status: Deferred  3.  Pt will tell SLP 4 compensatory measures he could use for iimproved verbal expression Baseline:  Goal status: Deferred  4.  Pt will write 4-5 sentences with modified independence in 3 sessions Baseline:  Goal status: Goal d/c'd - Pt now without anomia  5.  Wife will demo appropriate cueing for pt during 3 sessions Baseline:  Goal status: Goal d/c'd - Pt now without anomia  6.  Pt will complete Communication Effectiveness Survey in first session Baseline:  Goal status: Not met - SLP provided on 08-04-21  7. Pt will demo [facility] sustained/selective attention skills for 20 minute mod complex topics (conversational or other), in 2 sessions  Baseline:  Goal Status: Paritally Met  LONG TERM GOALS: Target date: 09/27/2021    Pt will improve his Communication Effectiveness Survey score taken in the last 1-2 sessions of ST, compared to his initial  score Baseline:  Goal status: Ongoing  2.  Pt will demo [facility] speech/language skills in 15 minutes mod complex/complex conversation with modified independence in 3 sessions Baseline: 08-08-21, 08/11/21 Goal status: Met  3.  Pt will demo [facility] auditory comprehension ability in 15 minutes mod complex/complex conversation with modified independence in 3 sessions Baseline: 08-05-21, 08/11/21 Goal status: Met  4.  Pt will demo WFL alternating and/or divided attention skills for 20-25 minute mod complex topics (conversational or other), in 3 sessions  Baseline:  Goal Status: Ongoing  5.  Pt will demo [facility] working memory for 20-25 minutes of auditory information (business [place]), in 3 sessions  Baseline:  Goal  Status: Ongoingl   ASSESSMENT:  CLINICAL IMPRESSION: Patient is a 61 y.o. male who was seen today for rehabilitation of receptive language and cognitive linguistics. Pt and SLP agree deficits include attention - and pt ID'd divided attention as challenging in doing his homework since last session. Pt also told SLP of his difficulty taking notes and cont to listen in order to capture notable points to cont to write down. Pt requires these working memory skills for meetings and conferences for his occupation. Verbal expression skills are reported as baseline. 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 SLP is unsure at this time if pt could return to work at his previous skill level. He will likely require modifications to his workflow upon his return to work. Skilled ST will assist him improving skills to WFL/WNL levels. Pt may be out of work for next 3-4 weeks, but also has consult for possible carotid stent placement in the next 3 weeks.  OBJECTIVE IMPAIRMENTS include awareness, expressive language, receptive language, and aphasia. These impairments are limiting patient from return to work, household  responsibilities, and effectively communicating at home and in community. Factors affecting potential to achieve goals and functional outcome are  possibly depression . Patient will benefit from skilled SLP services to address above impairments and improve overall function.  REHAB POTENTIAL: Excellent  PLAN: SLP FREQUENCY: 2x/week  SLP DURATION: 8 weeks  PLANNED INTERVENTIONS: Language facilitation, Environmental controls, Cueing hierachy, Cognitive reorganization, Internal/external aids, Functional tasks, Multimodal communication approach, SLP instruction and feedback, Compensatory strategies, and Patient/family education    [facility], CCC-SLP 08/15/2021, 12:18 PM

## 2021-08-17 ENCOUNTER — Ambulatory Visit: Payer: 59

## 2021-08-17 DIAGNOSIS — R41841 Cognitive communication deficit: Secondary | ICD-10-CM | POA: Diagnosis not present

## 2021-08-17 NOTE — Therapy (Signed)
OUTPATIENT SPEECH LANGUAGE PATHOLOGY TREATMENT NOTE   Patient Name: Timothy Leblanc MRN: [id number] DOB:11/06/1960, 61 y.o., male Today's Date: 08/17/2021  PCP: None on File REFERRING PROVIDER: Janine Ores, NP    End of Session - 08/17/21 1727     Visit Number 8    Number of Visits 25    Date for SLP Re-Evaluation 09/27/21    SLP Start Time 1320    SLP Stop Time  1401    SLP Time Calculation (min) 41 min    Activity Tolerance Patient tolerated treatment well                   Past Medical History:  Diagnosis Date   Diverticulitis 2016   Past Surgical History:  Procedure Laterality Date   COLONOSCOPY  2015   HYDROCELE EXCISION Right 09/08/2019   Procedure: HYDROCELE REPAIR;  Surgeon: Raynelle Bring, MD;  Location: [facility];  Service: Urology;  Laterality: Right;   IR 3D INDEPENDENT WKST  07/01/2021   IR ANGIO INTRA EXTRACRAN SEL COM CAROTID INNOMINATE UNI L MOD SED  07/01/2021   IR ANGIO INTRA EXTRACRAN SEL INTERNAL CAROTID UNI R MOD SED  07/01/2021   IR ANGIO VERTEBRAL SEL SUBCLAVIAN INNOMINATE UNI L MOD SED  07/01/2021   IR ANGIO VERTEBRAL SEL VERTEBRAL UNI R MOD SED  07/01/2021   IR US GUIDE VASC ACCESS RIGHT  07/01/2021   VASECTOMY  1996   Patient Active Problem List   Diagnosis Date Noted   Stroke ([place]) 06/30/2021   Stroke (cerebrum) ([place]) 06/30/2021   Acute ischemic stroke ([place]) 06/30/2021    ONSET DATE: 06-30-21   REFERRING DIAG: CVA  THERAPY DIAG:  Aphasia Cognitive Communication Deficit  Rationale for Evaluation and Treatment Rehabilitation  SUBJECTIVE:   SUBJECTIVE STATEMENT: "(Doing two minute intervals) worked really well. I thought about going to three minute intervals." Pt accompanied by: significant other [name]  PERTINENT HISTORY: Pt presented to emergency room at [facility] at 06-30-21 with transient episode of dizziness last known well 9:30 AM that day. 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 Noticed generalized weakness. No focal symptoms reported. Symptoms lasted about 20 minutes. Also had some incontinence but never lost consciousness.  PAIN:  Are you having pain? No   OBJECTIVE:    STANDARDIZED ASSESSMENTS: QAB: Mild  Summary      Word comprehension 10.00  Sentence comprehension 8.33  Word finding 8.58  Grammatical construction 8.75  Speech motor programming 7.50  Repetition 8.33  Reading 9.17  QAB overall 8.72     PATIENT REPORTED OUTCOME MEASURES (PROM): Pt scored 21/32, considering thoughts he had about communicating when he arrived to ST the first time.   TREATMENT:  08/17/21: Pt extended two minutes to three minute segments with TED talks. "By the time I got to the third three minutes I was exhausted." Pt assumes it was due to his fatigue level more than anything else. SLP told pt to use keywords instead of full details when taking notes.  Today [name] feels that he is more sluggish than usual. Discussion today about modifications to make at work if ability to note take does not improve after November - December. SLP suggested to have someone take notes for pt. Pt will to cont with 2-3 minute segments of TED talks and take keyword notes to prep for note-taking during business meetings.  08/15/21: Pt did some problem solving in regards to  the therapy tasks he completed over the weekend: realized 3 words for working memory was not difficult but 4 words were definitely more difficult. Nouns were easier than verbs or other words.  "I just can't do the TED talks without stopping to write the notes." Pt is thinking about making less detailed notes - keywords, as opposed to very detailed notes. SLP suggested stopping every 2 minutes to take notes and encouraged pt that a meeting will not be 20 minutes solid monologue and if so, like a presentation, pt will have slides/notes already so he will make keyword notes. SLP re-educated pt about  timeline for recovery - told pt if he still experiencing difficulty with working memory and with divided attention at the 83-monthmark that chances are greater that he will need to think about long-term implementation of compensations.  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 Pt used compensation of alternating attention instead, successfully. Pt found that he had to prioritize tasks instead of multitask, and did this successfully as well. CGerald Stabsalso describes working memory as a more challenging and salient deficit for him, due to eventual return to work. Discussion/education on definition of working memory, and possibility of goals to target this during therapy. Pt would like to do so. SLP provided education about some divided attention tasks he could perform at home and about Constant Therapy so that pt can work at home targeting his deficit areas.   08/08/21:Gerald Stabstold SLP that there was one TED talk with use of speakers that he was less/not interested in, that he missed details. All others appeared WNL. [name] said during that talk he was pacing around and maybe distracted by what he was looking at. SLP suggested pt consider the use of a dedicated listening device/receiver for meetings upon his return to work if he finds it necessary. CGerald Stabsand [name] think the difference is attention. CGerald Stabsvoiced concern over detailed work in [facility] other places he will need to search upon return to work. SLP reiterated the limitations to OP ST in this regard but suggested CGerald Stabslook at data online in areas he is interested in and practice this transfer of information this way. He agreed.  08-04-21 SLP told [name] SLP saw her inquiry to him this AM for a counselor/ Cognition (20 minutes): 20-minute discussion today about pt's homework since previous session. No anomia noted.  Pt did ~30 minute auditory information (TED Talks) -story-based- and pt had a long discussion about this with wife, in detail. He is using ear buds and is unsure if he could pay attention as well with speakers, and states he may not recall as many details with "neutral" information that he has little/no interest. Pt further described what could be attention deficits vs sx of depression as limited attention to written material. Pt to problem solve with and without ear buds and TED talks he would find interesting and not interesting and report back to SLP.  Speech tx: SLP engaged pt in additional 20-minute additional detailed description of depression and pt's homework while SLP assessed pt's skills with verbal expression. SLP did not detect any anomia. Wife and pt agree pt is no longer having anomia instances in the past week.   07/12/21 [name] reported CGerald Stabsgot very upset with a simple ST task desribed in "s" statement.  SLP educated pt/wife that if pt gets to  frustration level 7/10 to stop task and come back to task later (this task produced 10/10 frustration level). [name] stated chris' frustration level seems higher at this time, and he is more frequently frustrated over things that wouldn't have frustratd him premorbidly. SLP explained/educated on emotional changes following CVA (pt is not yet 2 weeks post -CVA). SLP explained to [name] why tasks that begin to frsutrate him are essential to complete. [name] described what could be an auditory memory vs. Auditory comprehension vs selective attention deficit, and gives specific examples of MD appointments as examples. SLP told/educated pt that he will do better if he takes notes - his writing is not as legible as it was prior to CVA - SLP encouraged him to use phone for notes. SLP suggested pt listen to 10 minute TED talks, pause and slow speed to < 1.0 PRN in order to take notes with these. Lastly [name] stated [name] is abandoning utterances  - SLP told [name] to pick  3x/day when she has [name] work to obtain the word he is trying to find- anything more frequent and SLP believes pt will get frustrated.   PATIENT EDUCATION: Education details: see above note for today Person educated: Patient and Spouse Education method: Explanation Education comprehension: verbalized understanding   GOALS: Goals reviewed with patient? Yes  SHORT TERM GOALS: Target date: 08/02/2021    Pt will engage in description (picture, object, room, etc)  and other mod complex verbal expression tasks with 85% success and rare min A for accurate language use in 3 sessions Baseline:  08-04-21, 08-08-21 Goal status: Partially met  2.  Pt will demo [facility] speech/language in 5 minutes mod complex conversation using compensations, in 3 sessions Baseline:  Goal status: Deferred  3.  Pt will tell SLP 4 compensatory measures he could use for iimproved verbal expression Baseline:  Goal status: Deferred  4.  Pt will write 4-5 sentences with modified independence in 3 sessions Baseline:  Goal status: Goal d/c'd - Pt now without anomia  5.  Wife will demo appropriate cueing for pt during 3 sessions Baseline:  Goal status: Goal d/c'd - Pt now without anomia  6.  Pt will complete Communication Effectiveness Survey in first session Baseline:  Goal status: Not met - SLP provided on 08-04-21  7. Pt will demo [facility] sustained/selective attention skills for 20 minute mod complex topics (conversational or other), in 2 sessions  Baseline:  Goal Status: Paritally Met  LONG TERM GOALS: Target date: 09/27/2021    Pt will improve his Communication Effectiveness Survey score taken in the last 1-2 sessions of ST, compared to his initial score Baseline:  Goal status: Ongoing  2.  Pt will demo [facility] speech/language skills in 15 minutes mod complex/complex conversation with modified independence in 3 sessions Baseline: 08-08-21, 08/11/21 Goal status: Met  3.  Pt will demo [facility] auditory comprehension  ability in 15 minutes mod complex/complex conversation with modified independence in 3 sessions Baseline: 08-05-21, 08/11/21 Goal status: Met  4.  Pt will demo WFL alternating and/or divided attention skills for 20-25 minute mod complex topics (conversational or other), in 3 sessions  Baseline:  Goal Status: Ongoing  5.  Pt will demo [facility] working memory for 20-25 minutes of auditory information (business [employer]), in 3 sessions  Baseline:  Goal Status: Ongoing   ASSESSMENT:  CLINICAL IMPRESSION: Patient is a 61 y.o. male who was seen today for rehabilitation of receptive language and cognitive linguistics. Pt and SLP agree deficits include attention -  and pt ID'd divided attention as challenging in doing his homework since last session. Pt also told SLP of his difficulty taking notes and cont to listen in order to capture notable points to cont to write down. Pt requires these working memory skills for meetings and conferences for his occupation. Verbal expression skills are reported as baseline. 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 SLP is unsure at this time if pt could return to work at his previous skill level. He will likely require modifications to his workflow upon his return to work. Skilled ST will assist him improving skills to WFL/WNL levels. Pt may be out of work for next 3-4 weeks, but also has consult for possible carotid stent placement in the next 3 weeks.  OBJECTIVE IMPAIRMENTS include awareness, expressive language, receptive language, and aphasia. These impairments are limiting patient from return to work, household responsibilities, and effectively communicating at home and in community. Factors affecting potential to achieve goals and functional outcome are  possibly depression . Patient will benefit from skilled SLP services to address above impairments and improve overall  function.  REHAB POTENTIAL: Excellent  PLAN: SLP FREQUENCY: 2x/week  SLP DURATION: 8 weeks  PLANNED INTERVENTIONS: Language facilitation, Environmental controls, Cueing hierachy, Cognitive reorganization, Internal/external aids, Functional tasks, Multimodal communication approach, SLP instruction and feedback, Compensatory strategies, and Patient/family education    Rex, CCC-SLP 08/17/2021, 5:27 PM

## 2021-08-19 ENCOUNTER — Encounter: Payer: Self-pay | Admitting: Neurology

## 2021-08-29 ENCOUNTER — Ambulatory Visit: Payer: 59

## 2021-08-29 DIAGNOSIS — R41841 Cognitive communication deficit: Secondary | ICD-10-CM | POA: Diagnosis not present

## 2021-08-29 NOTE — Therapy (Signed)
OUTPATIENT SPEECH LANGUAGE PATHOLOGY TREATMENT NOTE   Patient Name: Timothy Leblanc MRN: [id number] DOB:02/26/1960, 61 y.o., male Today's Date: 08/29/2021  PCP: None on File REFERRING PROVIDER: Janine Ores, NP    End of Session - 08/29/21 1738     Visit Number 9    Number of Visits 25    Date for SLP Re-Evaluation 09/27/21    SLP Start Time 1234    SLP Stop Time  3810    SLP Time Calculation (min) 41 min    Activity Tolerance Patient tolerated treatment well                    Past Medical History:  Diagnosis Date   Diverticulitis 2016   Past Surgical History:  Procedure Laterality Date   COLONOSCOPY  2015   HYDROCELE EXCISION Right 09/08/2019   Procedure: HYDROCELE REPAIR;  Surgeon: Raynelle Bring, MD;  Location: [facility];  Service: Urology;  Laterality: Right;   IR 3D INDEPENDENT WKST  07/01/2021   IR ANGIO INTRA EXTRACRAN SEL COM CAROTID INNOMINATE UNI L MOD SED  07/01/2021   IR ANGIO INTRA EXTRACRAN SEL INTERNAL CAROTID UNI R MOD SED  07/01/2021   IR ANGIO VERTEBRAL SEL SUBCLAVIAN INNOMINATE UNI L MOD SED  07/01/2021   IR ANGIO VERTEBRAL SEL VERTEBRAL UNI R MOD SED  07/01/2021   IR US GUIDE VASC ACCESS RIGHT  07/01/2021   VASECTOMY  1996   Patient Active Problem List   Diagnosis Date Noted   Stroke [facility]) 06/30/2021   Stroke (cerebrum) ([place]) 06/30/2021   Acute ischemic stroke ([place]) 06/30/2021    ONSET DATE: 06-30-21   REFERRING DIAG: CVA  THERAPY DIAG:  Aphasia Cognitive Communication Deficit  Rationale for Evaluation and Treatment Rehabilitation  SUBJECTIVE:   SUBJECTIVE STATEMENT: "I began to wonder if I could do this (Constant Therapy tasks) beforehand." Pt accompanied by: significant other [name]  PERTINENT HISTORY: Pt presented to emergency room at [facility] at 06-30-21 with transient episode of dizziness last known well 9:30 AM that day. 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 Noticed generalized weakness. No focal symptoms reported. Symptoms lasted about 20 minutes. Also had some incontinence but never lost consciousness.  PAIN:  Are you having pain? No   OBJECTIVE:    STANDARDIZED ASSESSMENTS: QAB: Mild  Summary      Word comprehension 10.00  Sentence comprehension 8.33  Word finding 8.58  Grammatical construction 8.75  Speech motor programming 7.50  Repetition 8.33  Reading 9.17  QAB overall 8.72     PATIENT REPORTED OUTCOME MEASURES (PROM): Pt scored 21/32, considering thoughts he had about communicating when he arrived to ST the first time.   TREATMENT:  08/29/21: Pt writing 4-5 word phrases for notes on TED talks instead of key words. Is seeing some improvement with recall and working memory using this method. Pt states with Constant Therapy, works at least an hour at one time. He is having difficulty with 6 and 7 number recall, and with complex sentence (multiple clauses) recall; [name] wonders how well he would have done with these tasks prior to CVA. SLP told pt to focus on TED talks and work with attention/memory. Pt could viably return to work week of 09-12-21, modified hours; I.e., 3-5 half days/week, ramping up as pt is able to do so. See "clinical impressions" for more details about this.  Pt to cont to work on tasks on  Constant Therapy (if any) he feels he oculd have completed premorbidly, and will cont to rehab/develop written note-taking skills from auditory information. Pt alternated attention was WFL/WNL during therapy today.   08/17/21: Pt extended two minutes to three minute segments with TED talks. "By the time I got to the third three minutes I was exhausted." Pt assumes it was due to his fatigue level more than anything else. SLP told pt to use keywords instead of full details when taking notes.  Today [name] feels that he is more sluggish than usual. Discussion today about modifications to make at work if ability to note take  does not improve after November - December. SLP suggested to have someone take notes for pt. Pt will to cont with 2-3 minute segments of TED talks and take keyword notes to prep for note-taking during business meetings.  08/15/21: Pt did some problem solving in regards to the therapy tasks he completed over the weekend: realized 3 words for working memory was not difficult but 4 words were definitely more difficult. Nouns were easier than verbs or other words.  "I just can't do the TED talks without stopping to write the notes." Pt is thinking about making less detailed notes - keywords, as opposed to very detailed notes. SLP suggested stopping every 2 minutes to take notes and encouraged pt that a meeting will not be 20 minutes solid monologue and if so, like a presentation, pt will have slides/notes already so he will make keyword notes. SLP re-educated pt about timeline for recovery - told pt if he still experiencing difficulty with working memory and with divided attention at the 78-monthmark that chances are greater that he will need to think about long-term implementation of compensations.  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 Pt used compensation of alternating attention instead, successfully. Pt found that he had to prioritize tasks instead of multitask, and did this successfully as well. CGerald Stabsalso describes working memory as a more challenging and salient deficit for him, due to eventual return to work. Discussion/education on definition of working memory, and possibility of goals to target this during therapy. Pt would like to do so. SLP provided education about some divided attention tasks he could perform at home and about Constant Therapy so that pt can work at home targeting his deficit areas.   08/08/21:Gerald Stabstold SLP that there was one TED  talk with use of speakers that he was less/not interested in, that he missed details. All others appeared WNL. [name] said during that talk he was pacing around and maybe distracted by what he was looking at. SLP suggested pt consider the use of a dedicated listening device/receiver for meetings upon his return to work if he finds it necessary. CGerald Stabsand [name] think the difference is attention. CGerald Stabsvoiced concern [place] need to search upon return to work. SLP reiterated the limitations to OP ST in this regard but suggested CGerald Stabslook at data online in areas he is interested in and practice this transfer of information this way. He agreed.  08-04-21 SLP told [name] SLP saw her inquiry to him this AM for a counselor/ Cognition (20 minutes): 20-minute discussion today about pt's homework since previous session. No anomia noted. Pt did ~30 minute auditory information (TED Talks) -story-based- and pt had a long discussion  about this with wife, in detail. He is using ear buds and is unsure if he could pay attention as well with speakers, and states he may not recall as many details with "neutral" information that he has little/no interest. Pt further described what could be attention deficits vs sx of depression as limited attention to written material. Pt to problem solve with and without ear buds and TED talks he would find interesting and not interesting and report back to SLP.  Speech tx: SLP engaged pt in additional 20-minute additional detailed description of depression and pt's homework while SLP assessed pt's skills with verbal expression. SLP did not detect any anomia. Wife and pt agree pt is no longer having anomia instances in the past week.   07/12/21 [name] reported [name] got very upset with a simple ST task desribed in "s" statement.  SLP educated pt/wife that if pt gets to frustration level 7/10 to stop task and come back to task later (this task produced 10/10  frustration level). [name] stated chris' frustration level seems higher at this time, and he is more frequently frustrated over things that wouldn't have frustratd him premorbidly. SLP explained/educated on emotional changes following CVA (pt is not yet 2 weeks post -CVA). SLP explained to [name] why tasks that begin to frsutrate him are essential to complete. [name] described what could be an auditory memory vs. Auditory comprehension vs selective attention deficit, and gives specific examples of MD appointments as examples. SLP told/educated pt that he will do better if he takes notes - his writing is not as legible as it was prior to CVA - SLP encouraged him to use phone for notes. SLP suggested pt listen to 10 minute TED talks, pause and slow speed to < 1.0 PRN in order to take notes with these. Lastly [name] stated [name] is abandoning utterances  - SLP told [name] to pick 3x/day when she has [name] work to obtain the word he is trying to find- anything more frequent and SLP believes pt will get frustrated.   PATIENT EDUCATION: Education details: see above note for today Person educated: Patient and Spouse Education method: Explanation Education comprehension: verbalized understanding   GOALS: Goals reviewed with patient? Yes  SHORT TERM GOALS: Target date: 08/02/2021    Pt will engage in description (picture, object, room, etc)  and other mod complex verbal expression tasks with 85% success and rare min A for accurate language use in 3 sessions Baseline:  08-04-21, 08-08-21 Goal status: Partially met  2.  Pt will demo [facility] speech/language in 5 minutes mod complex conversation using compensations, in 3 sessions Baseline:  Goal status: Deferred  3.  Pt will tell SLP 4 compensatory measures he could use for iimproved verbal expression Baseline:  Goal status: Deferred  4.  Pt will write 4-5 sentences with modified independence in 3 sessions Baseline:  Goal status: Goal d/c'd - Pt now without  anomia  5.  Wife will demo appropriate cueing for pt during 3 sessions Baseline:  Goal status: Goal d/c'd - Pt now without anomia  6.  Pt will complete Communication Effectiveness Survey in first session Baseline:  Goal status: Not met - SLP provided on 08-04-21  7. Pt will demo [facility] sustained/selective attention skills for 20 minute mod complex topics (conversational or other), in 2 sessions  Baseline:  Goal Status: Paritally Met  LONG TERM GOALS: Target date: 09/27/2021    Pt will improve his Communication Effectiveness Survey score taken in the last 1-2 sessions  of ST, compared to his initial score Baseline:  Goal status: Ongoing  2.  Pt will demo [facility] speech/language skills in 15 minutes mod complex/complex conversation with modified independence in 3 sessions Baseline: 08-08-21, 08/11/21 Goal status: Met  3.  Pt will demo [facility] auditory comprehension ability in 15 minutes mod complex/complex conversation with modified independence in 3 sessions Baseline: 08-05-21, 08/11/21 Goal status: Met  4.  Pt will demo WFL alternating and/or divided attention skills for 20-25 minute mod complex topics (conversational or other), in 3 sessions  Baseline: 08/29/21  Goal Status: Ongoing  5.  Pt will demo [facility] working memory for 20-25 minutes of auditory information (business meeting-like), using compensations in 3 sessions  Baseline:   Goal Status: Revised   ASSESSMENT:  CLINICAL IMPRESSION: Patient is a 61 y.o. male who was seen today for rehabilitation of receptive language and cognitive linguistics. Pt cont to listen to longer verbally presented material and practices capturing notable points in written form. Pt requires these working memory skills for meetings and conferences for his occupation. Verbal expression skills cont at baseline. 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 SLP is confident pt could return to  work on a limited basis (3-5 half days/week) and will likely require modifications to his workflow when he returns to work such as no more than one meeting/day where he is point person, and the opportunity to have an assistant/colleague with him to ensure accuracy when remembering details. Skilled ST will cont to assist him improving these tasks and cont to work on workable compensations.  OBJECTIVE IMPAIRMENTS include attention, memory, expressive language, receptive language, and aphasia. These impairments are limiting patient from return to work, household responsibilities, and effectively communicating at home and in community. Factors affecting potential to achieve goals and functional outcome are  possibly depression . Patient will benefit from skilled SLP services to address above impairments and improve overall function.  REHAB POTENTIAL: Excellent  PLAN: SLP FREQUENCY: 2x/week  SLP DURATION: 8 weeks  PLANNED INTERVENTIONS: Language facilitation, Environmental controls, Cueing hierachy, Cognitive reorganization, Internal/external aids, Functional tasks, Multimodal communication approach, SLP instruction and feedback, Compensatory strategies, and Patient/family education    [facility], [place] 08/29/2021, 5:38 PM

## 2021-09-07 ENCOUNTER — Ambulatory Visit: Payer: 59 | Admitting: Neurology

## 2021-09-07 ENCOUNTER — Encounter: Payer: Self-pay | Admitting: Neurology

## 2021-09-07 VITALS — BP 107/68 | HR 69 | Ht 69.0 in | Wt 206.6 lb

## 2021-09-07 DIAGNOSIS — I720 Aneurysm of carotid artery: Secondary | ICD-10-CM

## 2021-09-07 DIAGNOSIS — I63412 Cerebral infarction due to embolism of left middle cerebral artery: Secondary | ICD-10-CM

## 2021-09-07 DIAGNOSIS — J849 Interstitial pulmonary disease, unspecified: Secondary | ICD-10-CM | POA: Diagnosis not present

## 2021-09-07 DIAGNOSIS — I69320 Aphasia following cerebral infarction: Secondary | ICD-10-CM | POA: Diagnosis not present

## 2021-09-07 DIAGNOSIS — G3184 Mild cognitive impairment, so stated: Secondary | ICD-10-CM

## 2021-09-07 NOTE — Patient Instructions (Addendum)
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  I recommend further evaluation by repeating CT angiogram to look for resolution of the thrombus and continue dual antiplatelet therapy aspirin and Plavix for now and may consider switching to aspirin alone if the thrombus has resolved.  May consider referral to neuro interventional for angioplasty stenting if he has significant residual stenosis.  Check follow-up lipid profile today.  Continue Crestor and the current dose and if follow-up lipid profile is not satisfactory may consider adding Praluent or Repatha.  He will continue ongoing speech therapy and he may return to work without restrictions but I recommend that he increase his workload gradually as tolerated.  I also encouraged him to increase participation in cognitively challenging activities like solving crossword puzzles, playing bridge and sudoku and we discussed memory compensation strategies for his mild cognitive impairment.  Return for follow-up to see me in 6 months or call earlier if necessary. Memory Compensation Strategies  Use "WARM" strategy.  W= write it down  A= associate it  R= repeat it  M= make a mental note  2.   You can keep a Glass blower/designer.  Use a 3-ring notebook with sections for the following: calendar, important names and phone numbers,  medications, doctors' names/phone numbers, lists/reminders, and a section to journal what you did  each day.   3.    Use a calendar to write appointments down.  4.    Write yourself a schedule for the day.  This can be placed on the calendar or in a separate section of the Memory Notebook.  Keeping a  regular schedule can help memory.  5.    Use medication organizer with sections for each day or morning/evening pills.  You may need help loading it  6.    Keep a basket, or pegboard by the door.  Place items that you  need to take out with you in the basket or on the pegboard.  You may also want to  include a message board for reminders.  7.    Use sticky notes.  Place sticky notes with reminders in a place where the task is performed.  For example: " turn off the  stove" placed by the stove, "lock the door" placed on the door at eye level, " take your medications" on  the bathroom mirror or by the place where you normally take your medications.  8.    Use alarms/timers.  Use while cooking to remind yourself to check on food or as a reminder to take your medicine, or as a  reminder to make a call, or as a reminder to perform another task, etc. Stroke Prevention Some medical conditions and behaviors can lead to a higher chance of having a stroke. You can help prevent a stroke by eating healthy, exercising, not smoking, and managing any medical conditions you have. Stroke is a leading cause of functional impairment. Primary prevention is particularly important because a majority of strokes are first-time events. Stroke changes the lives of not only those who experience a stroke but also their family and other caregivers. How can this condition affect me? A stroke is a medical emergency and should be treated right away. A stroke can lead to brain damage and can sometimes be life-threatening. If a person gets medical treatment right away, there is a better chance of surviving and recovering from a stroke.  What can increase my risk? The following medical conditions may increase your risk of a stroke: Cardiovascular disease. High blood pressure (hypertension). Diabetes. High cholesterol. Sickle cell disease. Blood clotting disorders (hypercoagulable state). Obesity. Sleep disorders (obstructive sleep apnea). Other risk factors include: Being older than age 73. Having a history of blood clots, stroke, or mini-stroke (transient ischemic attack, TIA). Genetic factors, such as race, ethnicity, or a family history  of stroke. Smoking cigarettes or using other tobacco products. Taking birth control pills, especially if you also use tobacco. Heavy use of alcohol or drugs, especially cocaine and methamphetamine. Physical inactivity. What actions can I take to prevent this? Manage your health conditions High cholesterol levels. Eating a healthy diet is important for preventing high cholesterol. If cholesterol cannot be managed through diet alone, you may need to take medicines. Take any prescribed medicines to control your cholesterol as told by your health care provider. Hypertension. To reduce your risk of stroke, try to keep your blood pressure below 130/80. Eating a healthy diet and exercising regularly are important for controlling blood pressure. If these steps are not enough to manage your blood pressure, you may need to take medicines. Take any prescribed medicines to control hypertension as told by your health care provider. Ask your health care provider if you should monitor your blood pressure at home. Have your blood pressure checked every year, even if your blood pressure is normal. Blood pressure increases with age and some medical conditions. Diabetes. Eating a healthy diet and exercising regularly are important parts of managing your blood sugar (glucose). If your blood sugar cannot be managed through diet and exercise, you may need to take medicines. Take any prescribed medicines to control your diabetes as told by your health care provider. Get evaluated for obstructive sleep apnea. Talk to your health care provider about getting a sleep evaluation if you snore a lot or have excessive sleepiness. Make sure that any other medical conditions you have, such as atrial fibrillation or atherosclerosis, are managed. Nutrition Follow instructions from your health care provider about what to eat or drink to help manage your health condition. These instructions may include: Reducing your daily  calorie intake. Limiting how much salt (sodium) you use to 1,500 milligrams (mg) each day. Using only healthy fats for cooking, such as olive oil, canola oil, or sunflower oil. Eating healthy foods. You can do this by: Choosing foods that are high in fiber, such as whole grains, and fresh fruits and vegetables. Eating at least 5 servings of fruits and vegetables a day. Try to fill one-half of your plate with fruits and vegetables at each meal. Choosing lean protein foods, such as lean cuts of meat, poultry without skin, fish, tofu, beans, and nuts. Eating low-fat dairy products. Avoiding foods that are high in sodium. This can help lower blood pressure. Avoiding foods that have saturated fat, trans fat, and cholesterol. This can help prevent high cholesterol. Avoiding processed and prepared foods. Counting your daily carbohydrate intake.  Lifestyle If you drink alcohol: Limit how much you have to: 0-1 drink a day for women who are not pregnant. 0-2 drinks a day for men. Know how much alcohol is in your drink. In the [place] one drink equals one 12 oz bottle of beer ( ), one 5 oz glass of wine ( ), or one 1 oz glass of hard liquor (64mL). Do not use any products that contain nicotine or tobacco. These products include cigarettes, chewing tobacco, and vaping devices, such as e-cigarettes.  If you need help quitting, ask your health care provider. Avoid secondhand smoke. Do not use drugs. Activity  Try to stay at a healthy weight. Get at least 30 minutes of exercise on most days, such as: Fast walking. Biking. Swimming. Medicines Take over-the-counter and prescription medicines only as told by your health care provider. Aspirin or blood thinners (antiplatelets or anticoagulants) may be recommended to reduce your risk of forming blood clots that can lead to stroke. Avoid taking birth control pills. Talk to your health care provider about the risks of taking birth control pills  if: You are over 68 years old. You smoke. You get very bad headaches. You have had a blood clot. Where to find more information American Stroke Association: www.strokeassociation.org Get help right away if: You or a loved one has any symptoms of a stroke. "BE FAST" is an easy way to remember the main warning signs of a stroke: B - Balance. Signs are dizziness, sudden trouble walking, or loss of balance. E - Eyes. Signs are trouble seeing or a sudden change in vision. F - Face. Signs are sudden weakness or numbness of the face, or the face or eyelid drooping on one side. A - Arms. Signs are weakness or numbness in an arm. This happens suddenly and usually on one side of the body. S - Speech. Signs are sudden trouble speaking, slurred speech, or trouble understanding what people say. T - Time. Time to call emergency services. Write down what time symptoms started. You or a loved one has other signs of a stroke, such as: A sudden, severe headache with no known cause. Nausea or vomiting. Seizure. These symptoms may represent a serious problem that is an emergency. Do not wait to see if the symptoms will go away. Get medical help right away. Call your local emergency services (911 in the [place] Do not drive yourself to the hospital. Summary You can help to prevent a stroke by eating healthy, exercising, not smoking, limiting alcohol intake, and managing any medical conditions you may have. Do not use any products that contain nicotine or tobacco. These include cigarettes, chewing tobacco, and vaping devices, such as e-cigarettes. If you need help quitting, ask your health care provider. Remember "BE FAST" for warning signs of a stroke. Get help right away if you or a loved one has any of these signs. This information is not intended to replace advice given to you by your health care provider. Make sure you discuss any questions you have with your health care provider. Document Revised: 09/01/2019  Document Reviewed: 09/01/2019 Elsevier Patient Education  2023 [employer].
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
## 2021-09-08 ENCOUNTER — Telehealth: Payer: Self-pay | Admitting: Neurology

## 2021-09-08 ENCOUNTER — Ambulatory Visit: Payer: 59

## 2021-09-08 DIAGNOSIS — R41841 Cognitive communication deficit: Secondary | ICD-10-CM

## 2021-09-08 DIAGNOSIS — R4701 Aphasia: Secondary | ICD-10-CM

## 2021-09-08 LAB — LIPID PANEL
Chol/HDL Ratio: 2.1 ratio (ref 0.0–5.0)
Cholesterol, Total: 141 mg/dL (ref 100–199)
HDL: 67 mg/dL (ref >39)
LDL Chol Calc (NIH): 50 mg/dL (ref 0–99)
Triglycerides: 141 mg/dL (ref 0–149)
VLDL Cholesterol Cal: 24 mg/dL (ref 5–40)

## 2021-09-08 NOTE — Telephone Encounter (Signed)
[facility] NPR case #[id number] sent to GI

## 2021-09-08 NOTE — Therapy (Signed)
OUTPATIENT SPEECH LANGUAGE PATHOLOGY TREATMENT NOTE   Patient Name: Timothy Leblanc MRN: [id number] DOB:[birth year], 61 y.o., male Today's Date: 09/08/2021  PCP: None on File REFERRING PROVIDER: Janine Ores, NP    End of Session - 09/08/21 1349     Visit Number 10    Number of Visits 25    Date for SLP Re-Evaluation 09/27/21    SLP Start Time 1021    SLP Stop Time  1100    SLP Time Calculation (min) 39 min    Activity Tolerance Patient tolerated treatment well                     Past Medical History:  Diagnosis Date   Diverticulitis 2016   Past Surgical History:  Procedure Laterality Date   COLONOSCOPY  2015   HYDROCELE EXCISION Right 09/08/2019   Procedure: HYDROCELE REPAIR;  Surgeon: Raynelle Bring, MD;  Location: [facility];  Service: Urology;  Laterality: Right;   IR 3D INDEPENDENT WKST  07/01/2021   IR ANGIO INTRA EXTRACRAN SEL COM CAROTID INNOMINATE UNI L MOD SED  07/01/2021   IR ANGIO INTRA EXTRACRAN SEL INTERNAL CAROTID UNI R MOD SED  07/01/2021   IR ANGIO VERTEBRAL SEL SUBCLAVIAN INNOMINATE UNI L MOD SED  07/01/2021   IR ANGIO VERTEBRAL SEL VERTEBRAL UNI R MOD SED  07/01/2021   IR US GUIDE VASC ACCESS RIGHT  07/01/2021   VASECTOMY  1996   Patient Active Problem List   Diagnosis Date Noted   Interstitial pulmonary disease ([place]) 09/07/2021   Stroke ([place]) 06/30/2021   Stroke (cerebrum) ([place]) 06/30/2021   Acute ischemic stroke ([place]) 06/30/2021    ONSET DATE: 06-30-21   REFERRING DIAG: CVA  THERAPY DIAG:  Aphasia Cognitive Communication Deficit  Rationale for Evaluation and Treatment Rehabilitation  SUBJECTIVE:   SUBJECTIVE STATEMENT: "He told me that my stroke looks like it happened as a result of some sort of trauma." "I've really seen an improvement in the auditory side of things." Pt accompanied by: significant other [name]  PERTINENT HISTORY: Pt presented to emergency room at [facility] at 06-30-21 with transient episode of  dizziness last known well 9:30 AM that day. 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 Noticed generalized weakness. No focal symptoms reported. Symptoms lasted about 20 minutes. Also had some incontinence but never lost consciousness.  PAIN:  Are you having pain? No   OBJECTIVE:    STANDARDIZED ASSESSMENTS: QAB: Mild  Summary      Word comprehension 10.00  Sentence comprehension 8.33  Word finding 8.58  Grammatical construction 8.75  Speech motor programming 7.50  Repetition 8.33  Reading 9.17  QAB overall 8.72     PATIENT REPORTED OUTCOME MEASURES (PROM): Pt scored 21/32, considering thoughts he had about communicating when he arrived to ST the first time.   TREATMENT:  09/08/21: Pt provided details of his visit yesterday with Dr. Leonie Man. Pt told SLP 4 top news stories yesterday without difficulty. Improvement in auditory attention and comprehension was seen in pt's homework tasks; Pt feels like during TED talks he was able to listen and take notes simultaneously, reinforced yesterday with MD visit. Pt and SLP discussed how pt would transition back to work - plan is to begin 1/2 days x3-5 times a week and incr as pt is able to do so.  At this time pt and SLP believe appropriate to see pt again in 4 weeks.  08/29/21: Pt writing 4-5 word phrases for notes on TED talks instead of key words. Is seeing some improvement with recall and working memory using this method. Pt states with Constant Therapy, works at least an hour at one time. He is having difficulty with 6 and 7 number recall, and with complex sentence (multiple clauses) recall; [name] wonders how well he would have done with these tasks prior to CVA. SLP told pt to focus on TED talks and work with attention/memory. Pt could viably return to work week of 09-12-21, modified hours; I.e., 3-5 half days/week, ramping up as pt is able to do so. See "clinical impressions" for more  details about this.  Pt to cont to work on tasks on Constant Therapy (if any) he feels he oculd have completed premorbidly, and will cont to rehab/develop written note-taking skills from auditory information. Pt alternated attention was WFL/WNL during therapy today.   08/17/21: Pt extended two minutes to three minute segments with TED talks. "By the time I got to the third three minutes I was exhausted." Pt assumes it was due to his fatigue level more than anything else. SLP told pt to use keywords instead of full details when taking notes.  Today [name] feels that he is more sluggish than usual. Discussion today about modifications to make at work if ability to note take does not improve after November - December. SLP suggested to have someone take notes for pt. Pt will to cont with 2-3 minute segments of TED talks and take keyword notes to prep for note-taking during business meetings.  08/15/21: Pt did some problem solving in regards to the therapy tasks he completed over the weekend: realized 3 words for working memory was not difficult but 4 words were definitely more difficult. Nouns were easier than verbs or other words.  "I just can't do the TED talks without stopping to write the notes." Pt is thinking about making less detailed notes - keywords, as opposed to very detailed notes. SLP suggested stopping every 2 minutes to take notes and encouraged pt that a meeting will not be 20 minutes solid monologue and if so, like a presentation, pt will have slides/notes already so he will make keyword notes. SLP re-educated pt about timeline for recovery - told pt if he still experiencing difficulty with working memory and with divided attention at the 85-month mark that chances are greater that he will need to think about long-term implementation of compensations.  08/11/21: Pt reports in performing cognitive linguistic tasks in the last 2-3 days he found that divided attention with verbal and auditory  information appears less robust than premorbidly. Pt used compensation of alternating attention instead, successfully. Pt found that he had to prioritize tasks instead of multitask, and did this successfully as well. [name] also describes working memory as a more challenging and salient deficit for him, due to eventual return to work. Discussion/education on definition of working memory, and possibility of goals to target this during therapy. Pt would like to do so. SLP provided education about some divided attention tasks he could perform at home and about Constant Therapy so that pt can work at home targeting his deficit areas.   6/26/23[name] told SLP that there was one TED talk with use of speakers that he was less/not interested in, that he missed details. All others appeared WNL. [name] said during that talk he was pacing around and maybe distracted by what he was looking at. SLP suggested pt consider the use  of a dedicated listening device/receiver for meetings upon his return to work if he finds it necessary. [name] and [name] think the difference is attention. [name] voiced concern over detailed work in [place] and other places he will need to search upon return to work. SLP reiterated the limitations to OP ST in this regard but suggested [name] look at data online in areas he is interested in and practice this transfer of information this way. He agreed.   PATIENT EDUCATION: Education details: see above note for today Person educated: Patient and Spouse Education method: Explanation Education comprehension: verbalized understanding   GOALS: Goals reviewed with patient? Yes  SHORT TERM GOALS: Target date: 08/02/2021    Pt will engage in description (picture, object, room, etc)  and other mod complex verbal expression tasks with 85% success and rare min A for accurate language use in 3 sessions Baseline:  08-04-21, 08-08-21 Goal status: Partially met  2.  Pt will demo [facility] speech/language in 5  minutes mod complex conversation using compensations, in 3 sessions Baseline:  Goal status: Deferred  3.  Pt will tell SLP 4 compensatory measures he could use for iimproved verbal expression Baseline:  Goal status: Deferred  4.  Pt will write 4-5 sentences with modified independence in 3 sessions Baseline:  Goal status: Goal d/c'd - Pt now without anomia  5.  Wife will demo appropriate cueing for pt during 3 sessions Baseline:  Goal status: Goal d/c'd - Pt now without anomia  6.  Pt will complete Communication Effectiveness Survey in first session Baseline:  Goal status: Not met - SLP provided on 08-04-21  7. Pt will demo [facility] sustained/selective attention skills for 20 minute mod complex topics (conversational or other), in 2 sessions  Baseline:  Goal Status: Paritally Met  LONG TERM GOALS: Target date: 09/27/2021    Pt will improve his Communication Effectiveness Survey score taken in the last 1-2 sessions of ST, compared to his initial score Baseline:  Goal status: Ongoing  2.  Pt will demo [facility] speech/language skills in 15 minutes mod complex/complex conversation with modified independence in 3 sessions Baseline: 08-08-21, 08/11/21 Goal status: Met  3.  Pt will demo [facility] auditory comprehension ability in 15 minutes mod complex/complex conversation with modified independence in 3 sessions Baseline: 08-05-21, 08/11/21 Goal status: Met  4.  Pt will demo WFL alternating and/or divided attention skills for 20-25 minute mod complex topics (conversational or other), in 3 sessions  Baseline: 08/29/21, 09-08-21  Goal Status: Ongoing  5.  Pt will demo [facility] working memory for 20-25 minutes of auditory information (business [place]), using compensations in 3 sessions  Baseline: 09/08/21,   Goal Status: Revised   ASSESSMENT:  CLINICAL IMPRESSION: Patient is a 61 y.o. male who was seen today for rehabilitation of receptive language and cognitive linguistics. Pt cont to listen to  longer verbally presented material and practices capturing notable points in written form, which he states today has become easier for him to do the last 10 days. 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 Skilled ST will cont for 1-2 more sessions to ensure improvement with these tasks and cont to assist pt with workable compensations as/if they are needed.  OBJECTIVE IMPAIRMENTS include attention, memory, expressive language, receptive language, and aphasia. These impairments are limiting patient from return to work, household responsibilities, and effectively communicating at home and in community. Factors affecting potential to achieve goals and functional  outcome are  possibly depression . Patient will benefit from skilled SLP services to address above impairments and improve overall function.  REHAB POTENTIAL: Excellent  PLAN: SLP FREQUENCY: 2x/week  SLP DURATION: 8 weeks  PLANNED INTERVENTIONS: Language facilitation, Environmental controls, Cueing hierachy, Cognitive reorganization, Internal/external aids, Functional tasks, Multimodal communication approach, SLP instruction and feedback, Compensatory strategies, and Patient/family education    [facility], CCC-SLP 09/08/2021, 1:50 PM

## 2021-09-23 ENCOUNTER — Other Ambulatory Visit: Payer: 59

## 2021-09-23 ENCOUNTER — Ambulatory Visit
Admission: RE | Admit: 2021-09-23 | Discharge: 2021-09-23 | Disposition: A | Discharge status: Home / Self Care | Payer: 59 | Source: Ambulatory Visit | Attending: Neurology | Admitting: Neurology

## 2021-09-23 DIAGNOSIS — I720 Aneurysm of carotid artery: Secondary | ICD-10-CM

## 2021-09-23 MED ORDER — IOPAMIDOL (ISOVUE-370) INJECTION 76%
75.0000 mL | Freq: Once | INTRAVENOUS | Status: AC | PRN
  Administered 2021-09-23: 75 mL via INTRAVENOUS

## 2021-09-25 ENCOUNTER — Other Ambulatory Visit: Payer: Self-pay | Admitting: Neurology

## 2021-09-25 DIAGNOSIS — I729 Aneurysm of unspecified site: Secondary | ICD-10-CM

## 2021-09-27 ENCOUNTER — Telehealth (HOSPITAL_COMMUNITY): Payer: Self-pay

## 2021-09-27 ENCOUNTER — Other Ambulatory Visit (HOSPITAL_COMMUNITY): Payer: Self-pay | Admitting: Interventional Radiology

## 2021-09-27 ENCOUNTER — Telehealth: Payer: Self-pay | Admitting: Neurology

## 2021-09-27 DIAGNOSIS — I671 Cerebral aneurysm, nonruptured: Secondary | ICD-10-CM

## 2021-09-27 DIAGNOSIS — I639 Cerebral infarction, unspecified: Secondary | ICD-10-CM

## 2021-09-27 NOTE — Telephone Encounter (Signed)
Sent a message to Perry to see if she is able to schedule the patient.

## 2021-09-27 NOTE — Telephone Encounter (Signed)
Called to schedule consult f/u w/Dr. Corliss Skains, no answer, left vm. AW

## 2021-10-03 ENCOUNTER — Ambulatory Visit (HOSPITAL_COMMUNITY)
Admission: RE | Admit: 2021-10-03 | Discharge: 2021-10-03 | Disposition: A | Discharge status: Home / Self Care | Payer: 59 | Source: Ambulatory Visit | Attending: Interventional Radiology | Admitting: Interventional Radiology

## 2021-10-03 DIAGNOSIS — I639 Cerebral infarction, unspecified: Secondary | ICD-10-CM

## 2021-10-03 DIAGNOSIS — I671 Cerebral aneurysm, nonruptured: Secondary | ICD-10-CM

## 2021-10-03 NOTE — Telephone Encounter (Signed)
Noted he is scheduled for 10/03/21.

## 2021-10-04 HISTORY — PX: IR RADIOLOGIST EVAL & MGMT: IMG5224

## 2021-10-10 ENCOUNTER — Encounter: Payer: Self-pay | Admitting: Neurology

## 2021-10-10 DIAGNOSIS — J849 Interstitial pulmonary disease, unspecified: Secondary | ICD-10-CM

## 2021-10-10 DIAGNOSIS — I729 Aneurysm of unspecified site: Secondary | ICD-10-CM

## 2021-10-10 DIAGNOSIS — I69320 Aphasia following cerebral infarction: Secondary | ICD-10-CM

## 2021-10-10 DIAGNOSIS — I720 Aneurysm of carotid artery: Secondary | ICD-10-CM

## 2021-10-10 DIAGNOSIS — G3184 Mild cognitive impairment, so stated: Secondary | ICD-10-CM

## 2021-10-10 DIAGNOSIS — I63412 Cerebral infarction due to embolism of left middle cerebral artery: Secondary | ICD-10-CM

## 2021-10-12 ENCOUNTER — Encounter: Payer: Self-pay | Admitting: Neurology

## 2021-10-13 ENCOUNTER — Telehealth: Payer: Self-pay | Admitting: Neurology

## 2021-10-13 ENCOUNTER — Ambulatory Visit: Payer: 59 | Attending: Neurology

## 2021-10-13 DIAGNOSIS — R41841 Cognitive communication deficit: Secondary | ICD-10-CM | POA: Insufficient documentation

## 2021-10-13 MED ORDER — ROSUVASTATIN CALCIUM 40 MG PO TABS: 40.0000 mg | ORAL_TABLET | Freq: Every day | ORAL | 1 refills | Status: DC

## 2021-10-13 NOTE — Therapy (Signed)
OUTPATIENT SPEECH LANGUAGE PATHOLOGY TREATMENT NOTE/Renewal-Discharge summary   Patient Name: Timothy Leblanc MRN: [id number] DOB:[birth year], 61 y.o., male Today's Date: 10/13/2021  PCP: Waldemar Dickens, MD (documentation) REFERRING PROVIDER: Janine Ores, NP    End of Session - 10/13/21 1659     Visit Number 11    Number of Visits 25    Date for SLP Re-Evaluation 10/13/21    SLP Start Time 1450    SLP Stop Time  1530    SLP Time Calculation (min) 40 min    Activity Tolerance Patient tolerated treatment well                      Past Medical History:  Diagnosis Date   Diverticulitis 2016   Past Surgical History:  Procedure Laterality Date   COLONOSCOPY  2015   HYDROCELE EXCISION Right 09/08/2019   Procedure: HYDROCELE REPAIR;  Surgeon: Raynelle Bring, MD;  Location: [facility];  Service: Urology;  Laterality: Right;   IR 3D INDEPENDENT WKST  07/01/2021   IR ANGIO INTRA EXTRACRAN SEL COM CAROTID INNOMINATE UNI L MOD SED  07/01/2021   IR ANGIO INTRA EXTRACRAN SEL INTERNAL CAROTID UNI R MOD SED  07/01/2021   IR ANGIO VERTEBRAL SEL SUBCLAVIAN INNOMINATE UNI L MOD SED  07/01/2021   IR ANGIO VERTEBRAL SEL VERTEBRAL UNI R MOD SED  07/01/2021   IR RADIOLOGIST EVAL & MGMT  10/04/2021   IR US GUIDE VASC ACCESS RIGHT  07/01/2021   VASECTOMY  1996   Patient Active Problem List   Diagnosis Date Noted   Interstitial pulmonary disease ([place]) 09/07/2021   Stroke ([place]) 06/30/2021   Stroke (cerebrum) ([place]) 06/30/2021   Acute ischemic stroke ([place]) 06/30/2021    SPEECH THERAPY RENEWAL-DISCHARGE SUMMARY  Visits from Start of Care: 11  Current functional level related to goals / functional outcomes: See below. Pt's LTGs will be enforced today and then pt will be discharged. He reports his cognitive linguistic skills are back to baseline; He has had sufficient time at work and with home tasks to come to this conclusion.  When "hit with 3-4 things at once" at work he knew to  "slow down and take one thing at a time." He was successful using this technique .  He continued with cognitive stimulating tasks at home (Constant Therapy app) until last week.    Remaining deficits: None. Pt states he is back to full function with his cognitive linguistics.   Education / Equipment: Compensations for deficit areas, process to re-enter at work following his time off (increase time at work from week to week). How to structure home tasks.  Patient agrees to discharge. Patient goals were partially met. SLP fully believes pt would have met all goals had he had more sesisons between last session and today's session.  Patient is being discharged due to meeting the stated rehab goals. and a return to baseline levels.     ONSET DATE: 06-30-21   REFERRING DIAG: CVA  THERAPY DIAG:  Aphasia Cognitive Communication Deficit  Rationale for Evaluation and Treatment Rehabilitation  SUBJECTIVE:   SUBJECTIVE STATEMENT: "I seem to be right back into my swing of things (at work and at home)." Pt accompanied by: significant other [name]  PERTINENT HISTORY: Pt presented to emergency room at [facility] at 06-30-21 with transient episode of dizziness last known well 9:30 AM that day. 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 Noticed generalized weakness. No focal symptoms reported. Symptoms lasted about 20 minutes. Also had some incontinence but never lost consciousness.  PAIN:  Are you having pain? No   OBJECTIVE:    STANDARDIZED ASSESSMENTS: QAB: Mild  Summary      Word comprehension 10.00  Sentence comprehension 8.33  Word finding 8.58  Grammatical construction 8.75  Speech motor programming 7.50  Repetition 8.33  Reading 9.17  QAB overall 8.72     PATIENT REPORTED OUTCOME MEASURES (PROM): Pt scored 21/32, considering thoughts he had about communicating when he arrived to ST the first  time.   TREATMENT:  10/13/21: Pt reports that he had a gradual incr in hours at work and is on his second week of full time status and feels at his baseline cognitive linguistic function. He is comfortable with d/c at this time. He scored 31/32 on his follow up Communication Effectiveness Survey (higher numbers indicate more effective communication).   09/08/21: Pt provided details of his visit yesterday with Dr. Leonie Man. Pt told SLP 4 top news stories yesterday without difficulty. Improvement in auditory attention and comprehension was seen in pt's homework tasks; Pt feels like during TED talks he was able to listen and take notes simultaneously, reinforced yesterday with MD visit. Pt and SLP discussed how pt would transition back to work - plan is to begin 1/2 days x3-5 times a week and incr as pt is able to do so.  At this time pt and SLP believe appropriate to see pt again in 4 weeks.   08/29/21: Pt writing 4-5 word phrases for notes on TED talks instead of key words. Is seeing some improvement with recall and working memory using this method. Pt states with Constant Therapy, works at least an hour at one time. He is having difficulty with 6 and 7 number recall, and with complex sentence (multiple clauses) recall; [name] wonders how well he would have done with these tasks prior to CVA. SLP told pt to focus on TED talks and work with attention/memory. Pt could viably return to work week of 09-12-21, modified hours; I.e., 3-5 half days/week, ramping up as pt is able to do so. See "clinical impressions" for more details about this.  Pt to cont to work on tasks on Constant Therapy (if any) he feels he oculd have completed premorbidly, and will cont to rehab/develop written note-taking skills from auditory information. Pt alternated attention was WFL/WNL during therapy today.   08/17/21: Pt extended two minutes to three minute segments with TED talks. "By the time I got to the third three minutes I was  exhausted." Pt assumes it was due to his fatigue level more than anything else. SLP told pt to use keywords instead of full details when taking notes.  Today [name] feels that he is more sluggish than usual. Discussion today about modifications to make at work if ability to note take does not improve after November - December. SLP suggested to have someone take notes for pt. Pt will to cont with 2-3 minute segments of TED talks and take keyword notes to prep for note-taking during business meetings.  08/15/21: Pt did some problem solving in regards to the therapy tasks he completed over the weekend: realized 3 words for working memory was not difficult but 4 words were definitely more difficult. Nouns were easier than verbs or other words.  "I just can't do the TED talks without stopping to write the notes." Pt is thinking about making less detailed notes -  keywords, as opposed to very detailed notes. SLP suggested stopping every 2 minutes to take notes and encouraged pt that a meeting will not be 20 minutes solid monologue and if so, like a presentation, pt will have slides/notes already so he will make keyword notes. SLP re-educated pt about timeline for recovery - told pt if he still experiencing difficulty with working memory and with divided attention at the 79-monthmark that chances are greater that he will need to think about long-term implementation of compensations.  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 Pt used compensation of alternating attention instead, successfully. Pt found that he had to prioritize tasks instead of multitask, and did this successfully as well. CGerald Stabsalso describes working memory as a more challenging and salient deficit for him, due to eventual return to work. Discussion/education on definition of working memory, and possibility  of goals to target this during therapy. Pt would like to do so. SLP provided education about some divided attention tasks he could perform at home and about Constant Therapy so that pt can work at home targeting his deficit areas.   08/08/21:Gerald Stabstold SLP that there was one TED talk with use of speakers that he was less/not interested in, that he missed details. All others appeared WNL. [name] said during that talk he was pacing around and maybe distracted by what he was looking at. SLP suggested pt consider the use of a dedicated listening device/receiver for meetings upon his return to work if he finds it necessary. CGerald Stabsand [name] think the difference is attention. CGerald Stabsvoiced concern [place] need to search upon return to work. SLP reiterated the limitations to OP ST in this regard but suggested CGerald Stabslook at data online in areas he is interested in and practice this transfer of information this way. He agreed.   PATIENT EDUCATION: Education details: see above note for today Person educated: Patient and Spouse Education method: Explanation Education comprehension: verbalized understanding   GOALS: Goals reviewed with patient? Yes  SHORT TERM GOALS: Target date: 08/02/2021    Pt will engage in description (picture, object, room, etc)  and other mod complex verbal expression tasks with 85% success and rare min A for accurate language use in 3 sessions Baseline:  08-04-21, 08-08-21 Goal status: Partially met  2.  Pt will demo [facility]ech/language in 5 minutes mod complex conversation using compensations, in 3 sessions Baseline:  Goal status: Deferred  3.  Pt will tell SLP 4 compensatory measures he could use for iimproved verbal expression Baseline:  Goal status: Deferred  4.  Pt will write 4-5 sentences with modified independence in 3 sessions Baseline:  Goal status: Goal d/c'd - Pt now without anomia  5.  Wife will demo appropriate cueing  for pt during 3 sessions Baseline:  Goal status: Goal d/c'd - Pt now without anomia  6.  Pt will complete Communication Effectiveness Survey in first session Baseline:  Goal status: Not met - SLP provided on 08-04-21  7. Pt will demo [facility]ained/selective attention skills for 20 minute mod complex topics (conversational or other), in 2 sessions  Baseline:  Goal Status: Paritally Met  LONG TERM GOALS: Target date: 09/27/2021    Pt will improve his Communication Effectiveness Survey score taken in the last 1-2 sessions of ST, compared to his initial  score Baseline:  Goal status: Met  2.  Pt will demo [facility] speech/language skills in 15 minutes mod complex/complex conversation with modified independence in 3 sessions Baseline: 08-08-21, 08/11/21 Goal status: Met  3.  Pt will demo [facility] auditory comprehension ability in 15 minutes mod complex/complex conversation with modified independence in 3 sessions Baseline: 08-05-21, 08/11/21 Goal status: Met  4.  Pt will demo WFL alternating and/or divided attention skills for 20-25 minute mod complex topics (conversational or other), in 3 sessions  Baseline: 08/29/21, 09-08-21  Goal Status: Met  5.  Pt will demo [facility] working memory for 20-25 minutes of auditory information (business [place]), using compensations in 3 sessions  Baseline: 09/08/21,   Goal Status: Partially Met   ASSESSMENT:  CLINICAL IMPRESSION: Patient is a 61 y.o. male who was seen today for rehabilitation of receptive language and cognitive linguistics. Pt has returned to work in M[employer] at [employer] and reports WNL language and cognitive skills and is now adequately able to complete his job. He agrees d/c is appropriate at this time.  OBJECTIVE IMPAIRMENTS include attention, memory, expressive language, receptive language, and aphasia. These impairments are limiting patient from return to work, household responsibilities, and effectively communicating at home and in  community. Factors affecting potential to achieve goals and functional outcome are  possibly depression . Patient will benefit from skilled SLP services to address above impairments and improve overall function.  REHAB POTENTIAL: Excellent  PLAN: Discharge today.  PLANNED INTERVENTIONS: Language facilitation, Environmental controls, Cueing hierachy, Cognitive reorganization, Internal/external aids, Functional tasks, Multimodal communication approach, SLP instruction and feedback, Compensatory strategies, and Patient/family education    [facility], [place] 10/13/2021, 5:00 PM

## 2021-10-13 NOTE — Telephone Encounter (Signed)
I have refill sent and I sent the my chart message to Dr. Pearlean Brownie on Monday will update the pt on this.

## 2021-10-13 NOTE — Telephone Encounter (Signed)
Pt's wife, [name] Gancarz (on DPR) pt needs refill for rosuvastatin (CRESTOR) 40 MG tablet. Medication was prescribed in the hospital. Dr. Pearlean Brownie had instructed last office visit for him the stay on the Crestor. He needs a refill for the medication. Would like a call from the nurse. Can send refill to: [id number]  Pt sent a message Monday morning. Can you  also let me know when can receive a response.

## 2021-10-19 ENCOUNTER — Telehealth: Payer: Self-pay | Admitting: Neurology

## 2021-10-19 NOTE — Telephone Encounter (Signed)
Referral sent to [facility] with request to see Dr. Luvenia Heller [phone]

## 2021-10-22 ENCOUNTER — Encounter: Payer: Self-pay | Admitting: Neurology

## 2021-11-13 ENCOUNTER — Other Ambulatory Visit: Payer: Self-pay | Admitting: Neurology

## 2021-11-17 NOTE — Telephone Encounter (Signed)
Sent to MD

## 2021-11-25 ENCOUNTER — Encounter: Payer: Self-pay | Admitting: Neurology

## 2021-12-08 ENCOUNTER — Encounter: Payer: Self-pay | Admitting: Neurology

## 2021-12-15 ENCOUNTER — Telehealth: Payer: Self-pay | Admitting: Neurology

## 2021-12-15 ENCOUNTER — Other Ambulatory Visit: Payer: Self-pay | Admitting: Neurology

## 2021-12-15 DIAGNOSIS — I6389 Other cerebral infarction: Secondary | ICD-10-CM

## 2021-12-15 NOTE — Telephone Encounter (Signed)
Wife is asking to be called to schedule the CT scan as quickly as possible

## 2021-12-19 ENCOUNTER — Telehealth: Payer: Self-pay | Admitting: Neurology

## 2021-12-19 NOTE — Telephone Encounter (Signed)
[facility] [place] Case Number: [id number] sent to GI [phone]

## 2021-12-19 NOTE — Telephone Encounter (Signed)
The scans were sent to GI for scheduling.

## 2021-12-20 ENCOUNTER — Ambulatory Visit
Admission: RE | Admit: 2021-12-20 | Discharge: 2021-12-20 | Disposition: A | Discharge status: Home / Self Care | Payer: 59 | Source: Ambulatory Visit | Attending: Neurology | Admitting: Neurology

## 2021-12-20 DIAGNOSIS — I6389 Other cerebral infarction: Secondary | ICD-10-CM

## 2021-12-20 MED ORDER — IOPAMIDOL (ISOVUE-370) INJECTION 76%
75.0000 mL | Freq: Once | INTRAVENOUS | Status: AC | PRN
  Administered 2021-12-20: 75 mL via INTRAVENOUS

## 2021-12-23 NOTE — Progress Notes (Signed)
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  Nothing to worry about

## 2022-01-06 ENCOUNTER — Other Ambulatory Visit: Payer: Self-pay | Admitting: Neurology

## 2022-03-09 ENCOUNTER — Telehealth: Payer: Self-pay | Admitting: Neurology

## 2022-03-09 NOTE — Telephone Encounter (Signed)
LVM and sent mychart msg informing pt of r/s needed for 2/7 appt- MD out. 

## 2022-03-21 ENCOUNTER — Encounter: Payer: Self-pay | Admitting: Neurology

## 2022-03-21 NOTE — Telephone Encounter (Signed)
We aren't able to add him in any sooner since Dr. Leonie Man is full. If he has somewhere he would like for him to be worked in we could do that if we're told what day/time.

## 2022-03-22 ENCOUNTER — Ambulatory Visit: Payer: 59 | Admitting: Neurology

## 2022-03-23 ENCOUNTER — Telehealth: Payer: Self-pay | Admitting: Neurology

## 2022-03-23 NOTE — Telephone Encounter (Signed)
The patient's wife left me a voice mail asking to get MRIs scheduled before his appointment in [place] are not MRI orders in his chart. He had recent CT scans done. She said that he is supposed to get a MRI every 3 months? And his appointment with Dr. Leonie Man was supposed to be sooner but our office rescheduled. Please let me know if you want MRIs ordered

## 2022-03-27 ENCOUNTER — Encounter: Payer: Self-pay | Admitting: Neurology

## 2022-03-27 NOTE — Telephone Encounter (Signed)
I sent the patient this message in another encounter for a mychart message

## 2022-04-04 ENCOUNTER — Other Ambulatory Visit: Payer: Self-pay | Admitting: Neurology

## 2022-04-04 DIAGNOSIS — I720 Aneurysm of carotid artery: Secondary | ICD-10-CM

## 2022-04-10 NOTE — Telephone Encounter (Signed)
[facility] [place] case DX:[id number] sent to [place]

## 2022-05-02 ENCOUNTER — Ambulatory Visit: Payer: 59 | Admitting: Neurology

## 2022-05-02 ENCOUNTER — Encounter: Payer: Self-pay | Admitting: Neurology

## 2022-05-02 VITALS — BP 117/75 | HR 60 | Ht 69.0 in | Wt 216.0 lb

## 2022-05-02 DIAGNOSIS — G3184 Mild cognitive impairment, so stated: Secondary | ICD-10-CM

## 2022-05-02 DIAGNOSIS — I729 Aneurysm of unspecified site: Secondary | ICD-10-CM | POA: Diagnosis not present

## 2022-05-02 DIAGNOSIS — Z8679 Personal history of other diseases of the circulatory system: Secondary | ICD-10-CM

## 2022-05-02 DIAGNOSIS — Z8669 Personal history of other diseases of the nervous system and sense organs: Secondary | ICD-10-CM | POA: Diagnosis not present

## 2022-05-02 MED ORDER — BUTALBITAL-APAP-CAFFEINE 50-325-40 MG PO TABS
1.0000 | ORAL_TABLET | Freq: Three times a day (TID) | ORAL | 0 refills | Status: DC | PRN
Start: 2022-05-02 — End: 2022-10-05
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
## 2022-05-02 NOTE — Patient Instructions (Addendum)
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  I recommend further evaluation by repeating CT angiogram later this week to look for stability or improvement in pseudoaneurysm.  And continue  aspirin  .  Follow-up CT angiogram shows continued improvement in pseudoaneurysm stability will continue surveillance CT angiogram at 6 months to 1 year  interval.   continue Crestor in the current dose .  I also encouraged him to increase participation in cognitively challenging activities like solving crossword puzzles, playing bridge and sudoku and we discussed memory compensation strategies for his mild cognitive impairment.  Return for follow-up to see me in 12 months or call earlier if necessary.

## 2022-05-05 ENCOUNTER — Ambulatory Visit (HOSPITAL_COMMUNITY)
Admission: RE | Admit: 2022-05-05 | Discharge: 2022-05-05 | Disposition: A | Discharge status: Home / Self Care | Payer: 59 | Source: Ambulatory Visit | Attending: Neurology | Admitting: Neurology

## 2022-05-05 DIAGNOSIS — I720 Aneurysm of carotid artery: Secondary | ICD-10-CM | POA: Diagnosis present

## 2022-05-05 MED ORDER — IOHEXOL 350 MG/ML SOLN
75.0000 mL | Freq: Once | INTRAVENOUS | Status: AC | PRN
  Administered 2022-05-05: 75 mL via INTRAVENOUS

## 2022-05-09 ENCOUNTER — Encounter: Payer: Self-pay | Admitting: Neurology

## 2022-05-15 NOTE — Progress Notes (Signed)
Kindly inform the patient that CT angiogram study of the neck and brain blood vessels appears unchanged from the study in November 2023.  There is some tortuosity and dilatation of the left internal carotid artery in the upper portion of the neck but unchanged from before.  Nothing to worry about.

## 2022-05-16 ENCOUNTER — Ambulatory Visit: Payer: 59 | Admitting: Neurology

## 2022-05-17 ENCOUNTER — Encounter: Payer: Self-pay | Admitting: Neurology

## 2022-06-05 NOTE — Progress Notes (Unsigned)
[facility] [address] [place] [address] Phone: [phone] Subjective:   INadine Counts, am serving as a scribe for Dr. Antoine Primas.  I'm seeing this patient by the request  of:  Ozella Rocks, MD  CC: Low back pain, neck pain  [id number]  Timothy Leblanc is a 62 y.o. male coming in with complaint of LBP. Back pain for 4-5 years. Had other issues that have been taken care of. Stroke past May. Back pain worse the last 2 years. Pain mostly on lower R side. L side gluteal pain. Aggravated by activity. No treatment seems to help. Was taking Aleve before stroke, but now takes Fioricet. Stands and sit between being at computer. No radiating pain numbness or tingling. L hand goes numb on palmer surface sporadically. Within the last week has waken up with burning hot feeling in hands. A couple of weeks after stroke wasn't able to lift head without being extremely painful, last about 15 to 20 mins. Can't have neck manipulated because of stroke.      Past Medical History:  Diagnosis Date   Diverticulitis 2016   Past Surgical History:  Procedure Laterality Date   COLONOSCOPY  2015   HYDROCELE EXCISION Right 09/08/2019   Procedure: HYDROCELE REPAIR;  Surgeon: Heloise Purpura, MD;  Location: [facility];  Service: Urology;  Laterality: Right;   IR 3D INDEPENDENT WKST  07/01/2021   IR ANGIO INTRA EXTRACRAN SEL COM CAROTID INNOMINATE UNI L MOD SED  07/01/2021   IR ANGIO INTRA EXTRACRAN SEL INTERNAL CAROTID UNI R MOD SED  07/01/2021   IR ANGIO VERTEBRAL SEL SUBCLAVIAN INNOMINATE UNI L MOD SED  07/01/2021   IR ANGIO VERTEBRAL SEL VERTEBRAL UNI R MOD SED  07/01/2021   IR RADIOLOGIST EVAL & MGMT  10/04/2021   IR US GUIDE VASC ACCESS RIGHT  07/01/2021   VASECTOMY  1996   Social History   Socioeconomic History   Marital status: Married    Spouse name: Not on file   Number of children: Not on file   Years of education: Not on file   Highest education  level: Not on file  Occupational History   Not on file  Tobacco Use   Smoking status: Former    Packs/day: 1.00    Years: 15.00    Additional pack years: 0.00    Total pack years: 15.00    Types: Cigarettes    Quit date: 08/31/1996    Years since quitting: 25.7   Smokeless tobacco: Never  Vaping Use   Vaping Use: Never used  Substance and Sexual Activity   Alcohol use: Yes    Alcohol/week: 2.0 standard drinks of alcohol    Types: 2 Standard drinks or equivalent per week   Drug use: Yes    Frequency: 1.0 times per week    Types: Marijuana    Comment: once every 2 weeks   Sexual activity: Not on file  Other Topics Concern   Not on file  Social History Narrative   Not on file   Social Determinants of Health   Financial Resource Strain: Not on file  Food Insecurity: Not on file  Transportation Needs: Not on file  Physical Activity: Not on file  Stress: Not on file  Social Connections: Not on file   No Known Allergies No family history on file.   Current Outpatient Medications (Cardiovascular):    rosuvastatin (CRESTOR) 40 MG tablet, TAKE 1 TABLET BY MOUTH EVERY DAY  Current Outpatient Medications (Respiratory):    cetirizine (ZYRTEC) 10 MG tablet, Take 10 mg by mouth daily as needed for allergies.  Current Outpatient Medications (Analgesics):    ASPIRIN 81 PO, Take by mouth.   butalbital-acetaminophen-caffeine (FIORICET) 50-325-40 MG tablet, Take 1 tablet by mouth every 8 (eight) hours as needed for headache.   Current Outpatient Medications (Other):    gabapentin (NEURONTIN) 100 MG capsule, Take 2 capsules (200 mg total) by mouth at bedtime.   tamsulosin (FLOMAX) 0.4 MG CAPS capsule, Take 0.4 mg by mouth daily.   Reviewed prior external information including notes and imaging from  primary care provider As well as notes that were available from care everywhere and other healthcare systems.  Reviewed most recent images from the CT angiogram of the head and neck  with and without contrast in March 2024.  Found to have no significant area other than the old infarct Otherwise fairly unremarkable.  With looking at the CT scan severe arthritic changes noted.  Past medical history, social, surgical and family history all reviewed in electronic medical record.  No pertanent information unless stated regarding to the chief complaint.   Review of Systems:  No headache, visual changes, nausea, vomiting, diarrhea, constipation, dizziness, abdominal pain, skin rash, fevers, chills, night sweats, weight loss, swollen lymph nodes, body aches, joint swelling, chest pain, shortness of breath, mood changes. POSITIVE muscle aches, fatigue, body aches  Objective  Blood pressure 130/72, pulse 96, height  (1.753 m), weight 211 lb (95.7 kg), SpO2 96 %.   General: No apparent distress alert and oriented x3 mood and affect normal, dressed appropriately.  HEENT: Pupils equal, extraocular movements intact  Respiratory: Patient's speak in full sentences and does not appear short of breath  Cardiovascular: No lower extremity edema, non tender, no erythema  Neck exam does have some loss of lordosis noted.  Good range of motion though noted with flexion extension.  Some limitation with sidebending bilaterally. Low back exam does have some mild loss of lordosis.  Negative straight leg test noted.  Tightness with the legs noted but no significant weakness.  Deep tendon reflexes intact     Impression and Recommendations:    The above documentation has been reviewed and is accurate and complete Judi Saa, DO

## 2022-06-06 ENCOUNTER — Ambulatory Visit: Payer: 59 | Admitting: Family Medicine

## 2022-06-06 ENCOUNTER — Encounter: Payer: Self-pay | Admitting: Family Medicine

## 2022-06-06 ENCOUNTER — Ambulatory Visit (INDEPENDENT_AMBULATORY_CARE_PROVIDER_SITE_OTHER): Payer: 59

## 2022-06-06 VITALS — BP 130/72 | HR 96 | Ht 69.0 in | Wt 211.0 lb

## 2022-06-06 DIAGNOSIS — M5136 Other intervertebral disc degeneration, lumbar region: Secondary | ICD-10-CM | POA: Diagnosis not present

## 2022-06-06 DIAGNOSIS — M545 Low back pain, unspecified: Secondary | ICD-10-CM

## 2022-06-06 DIAGNOSIS — M542 Cervicalgia: Secondary | ICD-10-CM

## 2022-06-06 DIAGNOSIS — M2550 Pain in unspecified joint: Secondary | ICD-10-CM

## 2022-06-06 DIAGNOSIS — I63232 Cerebral infarction due to unspecified occlusion or stenosis of left carotid arteries: Secondary | ICD-10-CM

## 2022-06-06 DIAGNOSIS — M5030 Other cervical disc degeneration, unspecified cervical region: Secondary | ICD-10-CM | POA: Insufficient documentation

## 2022-06-06 LAB — COMPREHENSIVE METABOLIC PANEL
ALT: 34 U/L (ref 0–53)
AST: 27 U/L (ref 0–37)
Albumin: 4.7 g/dL (ref 3.5–5.2)
Alkaline Phosphatase: 81 U/L (ref 39–117)
BUN: 11 mg/dL (ref 6–23)
CO2: 27 mEq/L (ref 19–32)
Calcium: 10 mg/dL (ref 8.4–10.5)
Chloride: 105 mEq/L (ref 96–112)
Creatinine, Ser: 0.88 mg/dL (ref 0.40–1.50)
GFR: 92.55 mL/min (ref >60.00)
Glucose, Bld: 96 mg/dL (ref 70–99)
Potassium: 3.9 mEq/L (ref 3.5–5.1)
Sodium: 140 mEq/L (ref 135–145)
Total Bilirubin: 0.6 mg/dL (ref 0.2–1.2)
Total Protein: 7.2 g/dL (ref 6.0–8.3)

## 2022-06-06 LAB — CBC WITH DIFFERENTIAL/PLATELET
Basophils Absolute: 0.1 10*3/uL (ref 0.0–0.1)
Basophils Relative: 1.8 % (ref 0.0–3.0)
Eosinophils Absolute: 0.1 10*3/uL (ref 0.0–0.7)
Eosinophils Relative: 1.5 % (ref 0.0–5.0)
HCT: 44.6 % (ref 39.0–52.0)
Hemoglobin: 15.3 g/dL (ref 13.0–17.0)
Lymphocytes Relative: 28 % (ref 12.0–46.0)
Lymphs Abs: 1.3 10*3/uL (ref 0.7–4.0)
MCHC: 34.2 g/dL (ref 30.0–36.0)
MCV: 85.1 fl (ref 78.0–100.0)
Monocytes Absolute: 0.5 10*3/uL (ref 0.1–1.0)
Monocytes Relative: 10.7 % (ref 3.0–12.0)
Neutro Abs: 2.6 10*3/uL (ref 1.4–7.7)
Neutrophils Relative %: 58 % (ref 43.0–77.0)
Platelets: 164 10*3/uL (ref 150.0–400.0)
RBC: 5.23 Mil/uL (ref 4.22–5.81)
RDW: 13.8 % (ref 11.5–15.5)
WBC: 4.5 10*3/uL (ref 4.0–10.5)

## 2022-06-06 LAB — FERRITIN: Ferritin: 126 ng/mL (ref 22.0–322.0)

## 2022-06-06 LAB — TSH: TSH: 0.72 u[IU]/mL (ref 0.35–5.50)

## 2022-06-06 LAB — IBC PANEL
Iron: 87 ug/dL (ref 42–165)
Saturation Ratios: 25.6 % (ref 20.0–50.0)
TIBC: 340.2 ug/dL (ref 250.0–450.0)
Transferrin: 243 mg/dL (ref 212.0–360.0)

## 2022-06-06 LAB — URIC ACID: Uric Acid, Serum: 4.2 mg/dL (ref 4.0–7.8)

## 2022-06-06 LAB — TESTOSTERONE: Testosterone: 336.38 ng/dL (ref 300.00–890.00)

## 2022-06-06 LAB — C-REACTIVE PROTEIN: CRP: <1 mg/dL (ref 0.5–20.0)

## 2022-06-06 LAB — PSA: PSA: 5.72 ng/mL — ABNORMAL HIGH (ref 0.10–4.00)

## 2022-06-06 LAB — SEDIMENTATION RATE: Sed Rate: 9 mm/hr (ref 0–20)

## 2022-06-06 LAB — VITAMIN D 25 HYDROXY (VIT D DEFICIENCY, FRACTURES): VITD: 41.07 ng/mL (ref 30.00–100.00)

## 2022-06-06 MED ORDER — GABAPENTIN 100 MG PO CAPS: 200.0000 mg | ORAL_CAPSULE | Freq: Every day | ORAL | 0 refills | Status: DC

## 2022-06-06 NOTE — Assessment & Plan Note (Signed)
Reviewing patient's CT scan at the time of his admission showed the patient did have advanced osteoarthritic changes noted.  Do think that that could be contributing to some of the nerve impingement.  Started on gabapentin.  Differential although of the hands could also be radicular symptoms.  Depending on how patient responds could consider a nerve conduction study.  Follow-up again in 6 to 8 weeks to see how patient is doing.

## 2022-06-06 NOTE — Assessment & Plan Note (Signed)
Will get laboratory workup to see if any autoimmune disease could be potentially contributing as well.

## 2022-06-06 NOTE — Patient Instructions (Addendum)
Labs today PT referral Do prescribed exercises at least 3x a week Gabapentin  at night See you again in 6 weeks

## 2022-06-06 NOTE — Assessment & Plan Note (Signed)
Known degenerative disc disease.  Patient has been difficult time and becoming more active.  Patient still does not know what to do to be able to come become more active.  I feel that aquatic therapy could be beneficial to get patient more confidence as well as strength before we try to increase activity too much.  Patient would like to be potentially able to start land therapy.  Patient would like to be able to begin running again at some point.  Discussed with him that I do think that there is a possibility but will take some time.

## 2022-06-07 ENCOUNTER — Encounter: Payer: Self-pay | Admitting: Family Medicine

## 2022-06-08 ENCOUNTER — Other Ambulatory Visit: Payer: Self-pay

## 2022-06-08 DIAGNOSIS — M5136 Other intervertebral disc degeneration, lumbar region: Secondary | ICD-10-CM

## 2022-06-09 LAB — LIPID PANEL
Cholesterol: 146 mg/dL (ref <200)
HDL: 67 mg/dL (ref >=40)
LDL Cholesterol (Calc): 61 mg/dL (calc)
Non-HDL Cholesterol (Calc): 79 mg/dL (calc) (ref <130)
Total CHOL/HDL Ratio: 2.2 (calc) (ref <5.0)
Triglycerides: 101 mg/dL (ref <150)

## 2022-06-09 LAB — PTH, INTACT AND CALCIUM
Calcium: 9.9 mg/dL (ref 8.6–10.3)
PTH: 35 pg/mL (ref 16–77)

## 2022-06-09 LAB — ANGIOTENSIN CONVERTING ENZYME: Angiotensin-Converting Enzyme: 23 U/L (ref 9–67)

## 2022-06-09 LAB — RHEUMATOID FACTOR: Rheumatoid fact SerPl-aCnc: <10 IU/mL (ref <14)

## 2022-06-09 LAB — CALCIUM, IONIZED: Calcium, Ion: 5.3 mg/dL (ref 4.7–5.5)

## 2022-06-09 LAB — CYCLIC CITRUL PEPTIDE ANTIBODY, IGG: Cyclic Citrullin Peptide Ab: <16 UNITS

## 2022-06-09 LAB — ANA: Anti Nuclear Antibody (ANA): NEGATIVE

## 2022-06-27 ENCOUNTER — Ambulatory Visit: Payer: 59 | Admitting: Family Medicine

## 2022-07-10 ENCOUNTER — Other Ambulatory Visit: Payer: Self-pay | Admitting: Neurology

## 2022-07-13 NOTE — Telephone Encounter (Signed)
Pt was informed that he is needing an appt in order for him to get this refill. Pt was just seen in March. Pt would like to know when this medication will be filled for him. Please advise.

## 2022-07-14 ENCOUNTER — Encounter: Payer: Self-pay | Admitting: Rehabilitative and Restorative Service Providers"

## 2022-07-14 ENCOUNTER — Other Ambulatory Visit: Payer: Self-pay

## 2022-07-14 ENCOUNTER — Ambulatory Visit: Payer: 59 | Attending: Family Medicine | Admitting: Rehabilitative and Restorative Service Providers"

## 2022-07-14 DIAGNOSIS — M6281 Muscle weakness (generalized): Secondary | ICD-10-CM | POA: Insufficient documentation

## 2022-07-14 DIAGNOSIS — M5459 Other low back pain: Secondary | ICD-10-CM | POA: Insufficient documentation

## 2022-07-14 DIAGNOSIS — R2689 Other abnormalities of gait and mobility: Secondary | ICD-10-CM | POA: Diagnosis present

## 2022-07-14 DIAGNOSIS — M542 Cervicalgia: Secondary | ICD-10-CM | POA: Diagnosis present

## 2022-07-14 DIAGNOSIS — M545 Low back pain, unspecified: Secondary | ICD-10-CM | POA: Diagnosis not present

## 2022-07-14 DIAGNOSIS — R252 Cramp and spasm: Secondary | ICD-10-CM | POA: Diagnosis present

## 2022-07-14 NOTE — Progress Notes (Unsigned)
[facility] [address] [place] [address] Phone: [phone] Subjective:   Timothy Leblanc, am serving as a scribe for Dr. Antoine Primas.  I'm seeing this patient by the request  of:  Ozella Rocks, MD  CC: Low back pain follow-up  [id number]  06/06/2022 Reviewing patient's CT scan at the time of his admission showed the patient did have advanced osteoarthritic changes noted. Do think that that could be contributing to some of the nerve impingement. Started on gabapentin. Differential although of the hands could also be radicular symptoms. Depending on how patient responds could consider a nerve conduction study. Follow-up again in 6 to 8 weeks to see how patient is doing.   Known degenerative disc disease.  Patient has been difficult time and becoming more active.  Patient still does not know what to do to be able to come become more active.  I feel that aquatic therapy could be beneficial to get patient more confidence as well as strength before we try to increase activity too much.  Patient would like to be potentially able to start land therapy.  Patient would like to be able to begin running again at some point.  Discussed with him that I do think that there is a possibility but will take some time.     Update 07/17/2022 Timothy Leblanc is a 62 y.o. male coming in with complaint of cervical and lumbar spine pain.  Known degenerative disc of the cervical and lumbar spine.  Status post also stroke.  Patient was to start on gabapentin and start increasing activity.  When reviewing notes patient has been to PT for evaluation but no other follow-up yet.  Patient states that gabapentin has been helpful. Sore at night but is able to be more physical during the day. Patient just started PT.        Past Medical History:  Diagnosis Date   Diverticulitis 2016   Past Surgical History:  Procedure Laterality Date   COLONOSCOPY  2015   HYDROCELE  EXCISION Right 09/08/2019   Procedure: HYDROCELE REPAIR;  Surgeon: Heloise Purpura, MD;  Location: [facility];  Service: Urology;  Laterality: Right;   IR 3D INDEPENDENT WKST  07/01/2021   IR ANGIO INTRA EXTRACRAN SEL COM CAROTID INNOMINATE UNI L MOD SED  07/01/2021   IR ANGIO INTRA EXTRACRAN SEL INTERNAL CAROTID UNI R MOD SED  07/01/2021   IR ANGIO VERTEBRAL SEL SUBCLAVIAN INNOMINATE UNI L MOD SED  07/01/2021   IR ANGIO VERTEBRAL SEL VERTEBRAL UNI R MOD SED  07/01/2021   IR RADIOLOGIST EVAL & MGMT  10/04/2021   IR US GUIDE VASC ACCESS RIGHT  07/01/2021   VASECTOMY  1996   Social History   Socioeconomic History   Marital status: Married    Spouse name: Not on file   Number of children: Not on file   Years of education: Not on file   Highest education level: Not on file  Occupational History   Not on file  Tobacco Use   Smoking status: Former    Packs/day: 1.00    Years: 15.00    Additional pack years: 0.00    Total pack years: 15.00    Types: Cigarettes    Quit date: 08/31/1996    Years since quitting: 25.8   Smokeless tobacco: Never  Vaping Use   Vaping Use: Never used  Substance and Sexual Activity   Alcohol use: Yes    Alcohol/week: 2.0 standard drinks  of alcohol    Types: 2 Standard drinks or equivalent per week   Drug use: Yes    Frequency: 1.0 times per week    Types: Marijuana    Comment: once every 2 weeks   Sexual activity: Not on file  Other Topics Concern   Not on file  Social History Narrative   Not on file   Social Determinants of Health   Financial Resource Strain: Not on file  Food Insecurity: Not on file  Transportation Needs: Not on file  Physical Activity: Not on file  Stress: Not on file  Social Connections: Not on file   No Known Allergies No family history on file.   Current Outpatient Medications (Cardiovascular):    rosuvastatin (CRESTOR) 40 MG tablet, TAKE 1 TABLET BY MOUTH EVERY DAY  Current Outpatient Medications (Respiratory):    cetirizine  (ZYRTEC) 10 MG tablet, Take 10 mg by mouth daily as needed for allergies.  Current Outpatient Medications (Analgesics):    ASPIRIN 81 PO, Take by mouth.   butalbital-acetaminophen-caffeine (FIORICET) 50-325-40 MG tablet, Take 1 tablet by mouth every 8 (eight) hours as needed for headache.   Current Outpatient Medications (Other):    gabapentin (NEURONTIN) 100 MG capsule, Take 2 capsules (200 mg total) by mouth at bedtime.   tamsulosin (FLOMAX) 0.4 MG CAPS capsule, Take 0.4 mg by mouth daily.   Reviewed prior external information including notes and imaging from  primary care provider As well as notes that were available from care everywhere and other healthcare systems.  Past medical history, social, surgical and family history all reviewed in electronic medical record.  No pertanent information unless stated regarding to the chief complaint.   Review of Systems:  No headache, visual changes, nausea, vomiting, diarrhea, constipation, dizziness, abdominal pain, skin rash, fevers, chills, night sweats, weight loss, swollen lymph nodes, body aches, joint swelling, chest pain, shortness of breath, mood changes. POSITIVE muscle aches  Objective  Blood pressure 106/72, pulse (!) 56, height 5\' 9"  (1.753 m), weight 208 lb (94.3 kg), SpO2 95 %.   General: No apparent distress alert and oriented x3 mood and affect normal, dressed appropriately.  HEENT: Pupils equal, extraocular movements intact  Respiratory: Patient's speak in full sentences and does not appear short of breath  Cardiovascular: No lower extremity edema, non tender, no erythema  Neck exam shows patient does have some loss of lordosis still noted.  Some no improvement with some range of motion noted.  Patient does still have some tenderness to palpation in the paraspinal musculature.  Low back exam shows mild loss of lordosis but nothing severe at this time.    Impression and Recommendations:    The above documentation has been  reviewed and is accurate and complete Judi Saa, DO

## 2022-07-14 NOTE — Patient Instructions (Signed)
     [facility] Welcome to [facility]! Here you will find all the information you will need regarding your pool therapy. If you have further questions at any time, please call our office at [phone]. After completing your initial evaluation in the [facility], you may be eligible to complete a portion of your therapy in the pool. A typical week of therapy will consist of 1-2 typical physical therapy visits at our [facility] location and an additional session of therapy in the pool located at the [facility] at [address]. [address], [place] [address]. The phone number at the pool site is [phone]. Please call this number if you are running late or need to cancel your appointment.  Aquatic therapy will be offered on Wednesday mornings and Friday afternoons. Each session will last approximately 45 minutes. All scheduling and payments for aquatic therapy sessions, including cancelations, will be done through our [facility] location.  To be eligible for aquatic therapy, these criteria must be met: You must be able to independently change in the locker room and get to the pool deck. A caregiver can come with you to help if needed. There are benches for a caregiver to sit on next to the pool. No one with an open wound is permitted in the pool.  Handicap parking is available in the front and there is a drop off option for even closer accessibility. Please arrive 15 minutes prior to your appointment to prepare for your pool session. You must sign in at the front desk upon your arrival. Please be sure to attend to any toileting needs prior to entering the pool. Locker rooms for changing are available.  There is direct access to the pool deck from the locker room. You can lock your belongings in a locker or bring them with you poolside. Your therapist will greet you on the pool deck. There may be other swimmers in the pool at the  same time but your session is one-on-one with the therapist.   

## 2022-07-14 NOTE — Therapy (Signed)
OUTPATIENT PHYSICAL THERAPY THORACOLUMBAR EVALUATION   Patient Name: Timothy Leblanc MRN: [id number] DOB:[birth year], 62 y.o., male Today's Date: 07/14/2022  END OF SESSION:  PT End of Session - 07/14/22 0809     Visit Number 1    Date for PT Re-Evaluation 09/08/22    Authorization Type [employer]    PT Start Time 0800    PT Stop Time 0840    PT Time Calculation (min) 40 min    Activity Tolerance Patient tolerated treatment well    Behavior During Therapy [facility] for tasks assessed/performed             Past Medical History:  Diagnosis Date   Diverticulitis 2016   Past Surgical History:  Procedure Laterality Date   COLONOSCOPY  2015   HYDROCELE EXCISION Right 09/08/2019   Procedure: HYDROCELE REPAIR;  Surgeon: Heloise Purpura, MD;  Location: [facility];  Service: Urology;  Laterality: Right;   IR 3D INDEPENDENT WKST  07/01/2021   IR ANGIO INTRA EXTRACRAN SEL COM CAROTID INNOMINATE UNI L MOD SED  07/01/2021   IR ANGIO INTRA EXTRACRAN SEL INTERNAL CAROTID UNI R MOD SED  07/01/2021   IR ANGIO VERTEBRAL SEL SUBCLAVIAN INNOMINATE UNI L MOD SED  07/01/2021   IR ANGIO VERTEBRAL SEL VERTEBRAL UNI R MOD SED  07/01/2021   IR RADIOLOGIST EVAL & MGMT  10/04/2021   IR US GUIDE VASC ACCESS RIGHT  07/01/2021   VASECTOMY  1996   Patient Active Problem List   Diagnosis Date Noted   Degenerative disc disease, lumbar 06/06/2022   Degenerative cervical disc 06/06/2022   Interstitial pulmonary disease (HCC) 09/07/2021   Stroke (HCC) 06/30/2021   Stroke (cerebrum) (HCC) 06/30/2021   Acute ischemic stroke (HCC) 06/30/2021    PCP: Ozella Rocks, MD  REFERRING PROVIDER: Judi Saa, DO  REFERRING DIAG: M51.36 (ICD-10-CM) - Degenerative disc disease, lumbar  Rationale for Evaluation and Treatment: Rehabilitation  THERAPY DIAG:  Other low back pain  Cervicalgia  Cramp and spasm  Muscle weakness (generalized)  Balance problem  ONSET DATE: at least 5 years, but has gotten worse in the  past 2 years  SUBJECTIVE:                                                                                                                                                                                           SUBJECTIVE STATEMENT: Pt reports that he had a stroke approximately a year ago.  Pt reports that he has had back pain for at least 5 years.  Reports that he went on a hike in [place] with his son in July 2022 and had  some difficulty with one of the hikes and felt increased pain and was barely able to complete the hike.  States that he occasionally has neck pain where he looks down and "my neck locks up."  States that when he does a lot of yard work and washing his car, his pain worsens.  PERTINENT HISTORY:  OA, Degenerative Disc Disease, CVA in 2023  PAIN:  Are you having pain? Yes: NPRS scale: Generally 3-5/10, but states can go up to 10/10 Pain location: Primarily low back, occasionally cervical Pain description: varies between sharp to dull ache Aggravating factors: washing car, yard work Relieving factors: medication, rest  PRECAUTIONS: None  WEIGHT BEARING RESTRICTIONS: No  FALLS:  Has patient fallen in last 6 months? Yes. Number of falls 1 fall when working on his deck and walking across beams  LIVING ENVIRONMENT: Lives with: lives with their family and lives with their spouse Lives in: House/apartment Stairs: Yes: Internal: 12 steps; on left going up and External: 3 steps; bilateral but cannot reach both Has following equipment at home: None  OCCUPATION: Administrator, arts (works at a computer primarily)  PLOF: Independent and Leisure: playing golf, tennis, running.  However, has not been able to do secondary to pain.  PATIENT GOALS: Would like to be able to return to more active lifestyle of running and other desired sports.  NEXT MD VISIT: July 17, 2022 with Dr Katrinka Blazing  OBJECTIVE:   DIAGNOSTIC FINDINGS:  Lumbar Radiograph on  06/06/2022: IMPRESSION: Degenerative changes. No acute osseous abnormalities.  Cervical Radiograph on 06/06/2022: IMPRESSION: Degenerative changes. No acute osseous abnormalities.  PATIENT SURVEYS:  Eval:  FOTO 56% (projected 62% by visit 10)  SCREENING FOR RED FLAGS: Bowel or bladder incontinence: No Spinal tumors: No Cauda equina syndrome: No Compression fracture: No Abdominal aneurysm: No  COGNITION: Overall cognitive status: Within functional limits for tasks assessed     SENSATION: Reports occasionally has numbness and tingling in left hand  MUSCLE LENGTH: Hamstrings: slight tightness, but WFL  POSTURE: rounded shoulders and forward head  PALPATION: Pt with some muscle tightness noted in lumbar paraspinals.  Pt with reported SI joint pain.  LUMBAR ROM:   WFL with increased pain.  Reports that his neck "locks up" sometimes when he looks down.  LOWER EXTREMITY ROM:     WFL  UPPER/LOWER EXTREMITY MMT:    07/14/2022: UE is grossly WFL BLE strength is grossly at least 5-/5, except hip IR/ER are 4/5 bilat  LUMBAR SPECIAL TESTS:  Eval:  Slump test: Positive on left  FUNCTIONAL TESTS:  Eval: 5 times sit to stand: 9.07 sec Single Leg Stance:  Right- 7.87 sec, Left- 16.94 sec   GAIT: Distance walked: >500 ft Assistive device utilized: None Level of assistance: Complete Independence Comments: Patient does not report pain with walking, only jogging/running  TODAY'S TREATMENT:  DATE: 07/14/2022  Review of HEP (see below), information provided on aquatic PT   PATIENT EDUCATION:  Education details: Issued HEP Person educated: Patient Education method: Explanation, Demonstration, and Handouts Education comprehension: verbalized understanding  HOME EXERCISE PROGRAM: Access Code: [email or web address] URL: [email or web address] Date:  07/14/2022 Prepared by: Clydie Braun Quill Grinder  Exercises - Seated Scapular Retraction  - 1 x daily - 7 x weekly - 2 sets - 10 reps - Seated Cervical Retraction  - 1 x daily - 7 x weekly - 2 sets - 10 reps - Pelvic Circles on Swiss Ball  - 1 x daily - 7 x weekly - 2 sets - 10 reps - Swiss Ball March  - 1 x daily - 7 x weekly - 2 sets - 10 reps - Swiss Ball Knee Extension  - 1 x daily - 7 x weekly - 2 sets - 10 reps - Single Leg Stance with Support  - 1 x daily - 7 x weekly - 2 sets - 3 reps - 30 sec hold  ASSESSMENT:  CLINICAL IMPRESSION: Patient is a 62 y.o. male who was seen today for physical therapy evaluation and treatment for lumbar degenerative disc disease. Additionally, pt report occasional pain with cervical region.  Patient's PLOF was able to go on hikes with his son, play golf/tennis, and go for runs.  Patient states that for the past 2 years, he has noted increased back pain.  Patient states that he has been unable to jog or play sports as he did previously.  Additionally, he has difficulty performing tasks where his arms are outstretched.  Patient presents with hip muscle weakness, decreased balance, decreased core stability/postural awareness, and increased pain with activities.  Patient would benefit from skilled PT to address his functional impairments and allow him to return to his active lifestyle.  OBJECTIVE IMPAIRMENTS: decreased balance, decreased strength, increased muscle spasms, postural dysfunction, and pain.   ACTIVITY LIMITATIONS: carrying, reach over head, and washing his car  PARTICIPATION LIMITATIONS: community activity and yard work  PERSONAL FACTORS: Profession, Time since onset of injury/illness/exacerbation, and 3+ comorbidities: CVA in 2023, OA, DDD  are also affecting patient's functional outcome.   REHAB POTENTIAL: Good  CLINICAL DECISION MAKING: Evolving/moderate complexity  EVALUATION COMPLEXITY: Moderate   GOALS: Goals reviewed with patient?  Yes  SHORT TERM GOALS: Target date: 08/02/2022  Patient will be independent with initial HEP. Baseline: Goal status: INITIAL  2.  Patient will report at least a 30% improvement in symptoms. Baseline:  Goal status: INITIAL   LONG TERM GOALS: Target date: 09/08/2022  Patient will be independent with advanced HEP. Baseline:  Goal status: INITIAL  2.  Patient will increase FOTO to at least 62% to demonstrate improvements in functional mobility. Baseline: 56% Goal status: INITIAL  3.  Patient will report ability to return to occasional jogging and/or playing golf without increased pain. Baseline:  Goal status: INITIAL  4.  Patient will report ability to wash his car with pain no greater than 3/10. Baseline:  Goal status: INITIAL  5.  Patient will increase single leg stance time to at least 30 seconds to increase his stability/decrease risk of falling. Baseline: Right- 7.87 sec, Left- 16.94 sec Goal status: INITIAL   PLAN:  PT FREQUENCY: 2x/week  PT DURATION: 8 weeks  PLANNED INTERVENTIONS: Therapeutic exercises, Therapeutic activity, Neuromuscular re-education, Balance training, Gait training, Patient/Family education, Self Care, Joint mobilization, Joint manipulation, Stair training, Aquatic Therapy, Dry Needling, Electrical stimulation, Spinal manipulation, Spinal mobilization, Cryotherapy, Moist heat, Taping, Vasopneumatic  device, Traction, Ultrasound, Ionotophoresis 4mg /ml Dexamethasone, Manual therapy, and Re-evaluation.  PLAN FOR NEXT SESSION: Assess and progress HEP as indicated, core stability, balance, aquatic PT   Reather Laurence, PT 07/14/2022, 8:49 AM   [facility] [address], [address] [place], [place] [address] Phone # [phone] Fax [phone]

## 2022-07-17 ENCOUNTER — Ambulatory Visit: Payer: 59 | Admitting: Family Medicine

## 2022-07-17 VITALS — BP 106/72 | HR 56 | Ht 69.0 in | Wt 208.0 lb

## 2022-07-17 DIAGNOSIS — M5030 Other cervical disc degeneration, unspecified cervical region: Secondary | ICD-10-CM

## 2022-07-17 NOTE — Assessment & Plan Note (Addendum)
Still having radicular symptoms especially down the left side of the arm at the moment.  Still difficult to decide if this is more post stroke related, cervical related or potentially some underlying carpal tunnel with patient doing a lot of repetitive activity.  At this point I would like patient to have a nerve conduction test done.  Continue the gabapentin and increase slowly.  We did discuss with patient at great length over the x-rays with him as well as his spouse today.  Total time with patient including looking at images 31 minutes.  Follow-up again in 2 months

## 2022-07-17 NOTE — Patient Instructions (Addendum)
Nerve Conduction Study Write [place] in a week after taking gabapentin 200mg  in the AM 100mg  at night See you again in 2-3 months

## 2022-07-20 ENCOUNTER — Ambulatory Visit: Payer: 59 | Attending: Family Medicine | Admitting: Rehabilitative and Restorative Service Providers"

## 2022-07-20 ENCOUNTER — Encounter: Payer: Self-pay | Admitting: Rehabilitative and Restorative Service Providers"

## 2022-07-20 DIAGNOSIS — R2689 Other abnormalities of gait and mobility: Secondary | ICD-10-CM | POA: Diagnosis present

## 2022-07-20 DIAGNOSIS — M6281 Muscle weakness (generalized): Secondary | ICD-10-CM | POA: Diagnosis present

## 2022-07-20 DIAGNOSIS — M542 Cervicalgia: Secondary | ICD-10-CM | POA: Insufficient documentation

## 2022-07-20 DIAGNOSIS — R252 Cramp and spasm: Secondary | ICD-10-CM | POA: Diagnosis present

## 2022-07-20 DIAGNOSIS — M5459 Other low back pain: Secondary | ICD-10-CM | POA: Diagnosis present

## 2022-07-20 NOTE — Patient Instructions (Signed)

## 2022-07-20 NOTE — Therapy (Signed)
OUTPATIENT PHYSICAL THERAPY TREATMENT NOTE   Patient Name: Timothy Leblanc MRN: [id number] DOB:1960/07/26, 62 y.o., male Today's Date: 07/20/2022  END OF SESSION:  PT End of Session - 07/20/22 1149     Visit Number 2    Date for PT Re-Evaluation 09/08/22    Authorization Type UHC    PT Start Time 1145    PT Stop Time 1225    PT Time Calculation (min) 40 min    Activity Tolerance Patient tolerated treatment well    Behavior During Therapy [facility] for tasks assessed/performed             Past Medical History:  Diagnosis Date   Diverticulitis 2016   Past Surgical History:  Procedure Laterality Date   COLONOSCOPY  2015   HYDROCELE EXCISION Right 09/08/2019   Procedure: HYDROCELE REPAIR;  Surgeon: Heloise Purpura, MD;  Location: [facility];  Service: Urology;  Laterality: Right;   IR 3D INDEPENDENT WKST  07/01/2021   IR ANGIO INTRA EXTRACRAN SEL COM CAROTID INNOMINATE UNI L MOD SED  07/01/2021   IR ANGIO INTRA EXTRACRAN SEL INTERNAL CAROTID UNI R MOD SED  07/01/2021   IR ANGIO VERTEBRAL SEL SUBCLAVIAN INNOMINATE UNI L MOD SED  07/01/2021   IR ANGIO VERTEBRAL SEL VERTEBRAL UNI R MOD SED  07/01/2021   IR RADIOLOGIST EVAL & MGMT  10/04/2021   IR US GUIDE VASC ACCESS RIGHT  07/01/2021   VASECTOMY  1996   Patient Active Problem List   Diagnosis Date Noted   Degenerative disc disease, lumbar 06/06/2022   Degenerative cervical disc 06/06/2022   Interstitial pulmonary disease (HCC) 09/07/2021   Stroke (HCC) 06/30/2021   Stroke (cerebrum) (HCC) 06/30/2021   Acute ischemic stroke (HCC) 06/30/2021    PCP: Ozella Rocks, MD  REFERRING PROVIDER: Judi Saa, DO  REFERRING DIAG: M51.36 (ICD-10-CM) - Degenerative disc disease, lumbar  Rationale for Evaluation and Treatment: Rehabilitation  THERAPY DIAG:  Other low back pain  Cervicalgia  Cramp and spasm  Muscle weakness (generalized)  Balance problem  ONSET DATE: at least 5 years, but has gotten worse in the past 2  years  SUBJECTIVE:                                                                                                                                                                                           SUBJECTIVE STATEMENT: Pt reports that he is having some back pain, but denies cervical pain.  PERTINENT HISTORY:  OA, Degenerative Disc Disease, CVA in 2023  PAIN:  Are you having pain? Yes: NPRS scale: back 2/10 Pain location: Primarily low back, occasionally cervical  Pain description: varies between sharp to dull ache Aggravating factors: washing car, yard work Relieving factors: medication, rest  PRECAUTIONS: None  WEIGHT BEARING RESTRICTIONS: No  FALLS:  Has patient fallen in last 6 months? Yes. Number of falls 1 fall when working on his deck and walking across beams  LIVING ENVIRONMENT: Lives with: lives with their family and lives with their spouse Lives in: House/apartment Stairs: Yes: Internal: 12 steps; on left going up and External: 3 steps; bilateral but cannot reach both Has following equipment at home: None  OCCUPATION: Administrator, arts (works at a computer primarily)  PLOF: Independent and Leisure: playing golf, tennis, running.  However, has not been able to do secondary to pain.  PATIENT GOALS: Would like to be able to return to more active lifestyle of running and other desired sports.  NEXT MD VISIT: July 17, 2022 with Dr Katrinka Blazing  OBJECTIVE:   DIAGNOSTIC FINDINGS:  Lumbar Radiograph on 06/06/2022: IMPRESSION: Degenerative changes. No acute osseous abnormalities.  Cervical Radiograph on 06/06/2022: IMPRESSION: Degenerative changes. No acute osseous abnormalities.  PATIENT SURVEYS:  Eval:  FOTO 56% (projected 62% by visit 10)  SCREENING FOR RED FLAGS: Bowel or bladder incontinence: No Spinal tumors: No Cauda equina syndrome: No Compression fracture: No Abdominal aneurysm: No  COGNITION: Overall cognitive status: Within functional limits  for tasks assessed     SENSATION: Reports occasionally has numbness and tingling in left hand  MUSCLE LENGTH: Hamstrings: slight tightness, but WFL  POSTURE: rounded shoulders and forward head  PALPATION: Pt with some muscle tightness noted in lumbar paraspinals.  Pt with reported SI joint pain.  LUMBAR ROM:   WFL with increased pain.  Reports that his neck "locks up" sometimes when he looks down.  LOWER EXTREMITY ROM:     WFL  UPPER/LOWER EXTREMITY MMT:    07/14/2022: UE is grossly WFL BLE strength is grossly at least 5-/5, except hip IR/ER are 4/5 bilat  LUMBAR SPECIAL TESTS:  Eval:  Slump test: Positive on left  FUNCTIONAL TESTS:  Eval: 5 times sit to stand: 9.07 sec Single Leg Stance:  Right- 7.87 sec, Left- 16.94 sec   GAIT: Distance walked: >500 ft Assistive device utilized: None Level of assistance: Complete Independence Comments: Patient does not report pain with walking, only jogging/running  TODAY'S TREATMENT:                                                                                                                               DATE: 07/20/2022  UBE level 1.0 x2 min each direction with PT present to discuss status Standing rows with red tband 2x10 with 1 sec hold Seated cervical retraction isometric with ball behind head 2x10 Standing facing wall performing lower trap lift off 2x10 Standing hamstring stretch at stairs 2x20 sec bilat Sitting on blue pball:  CW and CCW pelvic rotations x10 bilat Sitting on blue pball:  marching and LAQ 2x10 each bilat (had some pain during  and needed to stand up to reposition himself) Quadruped cat/cow x5 3 way child's pose x20 sec bilat Hooklying posterior pelvic tilt 2x10 Manual Therapy:  Addaday to bilateral lumbar paraspinals, bilateral glutes and hamstrings. Education on dry needling and self manual glute/piriformis trigger point release using a lacrosse ball standing against wall.   DATE: 07/14/2022  Review  of HEP (see below), information provided on aquatic PT   PATIENT EDUCATION:  Education details: Issued HEP Person educated: Patient Education method: Explanation, Demonstration, and Handouts Education comprehension: verbalized understanding  HOME EXERCISE PROGRAM: Access Code: [email or web address] URL: [email or web address] Date: 07/14/2022 Prepared by: Clydie Braun Bryant Lipps  Exercises - Seated Scapular Retraction  - 1 x daily - 7 x weekly - 2 sets - 10 reps - Seated Cervical Retraction  - 1 x daily - 7 x weekly - 2 sets - 10 reps - Pelvic Circles on Swiss Ball  - 1 x daily - 7 x weekly - 2 sets - 10 reps - Swiss Ball March  - 1 x daily - 7 x weekly - 2 sets - 10 reps - Swiss Ball Knee Extension  - 1 x daily - 7 x weekly - 2 sets - 10 reps - Single Leg Stance with Support  - 1 x daily - 7 x weekly - 2 sets - 3 reps - 30 sec hold  ASSESSMENT:  CLINICAL IMPRESSION: Mr Antunes presents to skilled PT reporting that Dr Katrinka Blazing recommended that he go for a nerve conduction velocity test.  Patient has been compliant with HEP and has worked out this morning.  Patient requires some cuing and repositioning with exercises for improved technique and decreased pain.  Pt with increased tightness noted and educated pt in how to use a lacrosse ball against the wall to perform a self trigger point release.  Patient reported some relief of his pain with this exercise.  Patient educated about dry needling and may attempt this next land-based session.  Pt continues to require skilled PT to progress towards goal related activities and to obtain a ball for home trigger point release.  OBJECTIVE IMPAIRMENTS: decreased balance, decreased strength, increased muscle spasms, postural dysfunction, and pain.   ACTIVITY LIMITATIONS: carrying, reach over head, and washing his car  PARTICIPATION LIMITATIONS: community activity and yard work  PERSONAL FACTORS: Profession, Time since onset of injury/illness/exacerbation, and  3+ comorbidities: CVA in 2023, OA, DDD  are also affecting patient's functional outcome.   REHAB POTENTIAL: Good  CLINICAL DECISION MAKING: Evolving/moderate complexity  EVALUATION COMPLEXITY: Moderate   GOALS: Goals reviewed with patient? Yes  SHORT TERM GOALS: Target date: 08/02/2022  Patient will be independent with initial HEP. Baseline: Goal status: MET  2.  Patient will report at least a 30% improvement in symptoms. Baseline:  Goal status: IN PROGRESS   LONG TERM GOALS: Target date: 09/08/2022  Patient will be independent with advanced HEP. Baseline:  Goal status: INITIAL  2.  Patient will increase FOTO to at least 62% to demonstrate improvements in functional mobility. Baseline: 56% Goal status: INITIAL  3.  Patient will report ability to return to occasional jogging and/or playing golf without increased pain. Baseline:  Goal status: INITIAL  4.  Patient will report ability to wash his car with pain no greater than 3/10. Baseline:  Goal status: INITIAL  5.  Patient will increase single leg stance time to at least 30 seconds to increase his stability/decrease risk of falling. Baseline: Right- 7.87 sec, Left- 16.94 sec Goal status: INITIAL   PLAN:  PT FREQUENCY: 2x/week  PT DURATION: 8 weeks  PLANNED INTERVENTIONS: Therapeutic exercises, Therapeutic activity, Neuromuscular re-education, Balance training, Gait training, Patient/Family education, Self Care, Joint mobilization, Joint manipulation, Stair training, Aquatic Therapy, Dry Needling, Electrical stimulation, Spinal manipulation, Spinal mobilization, Cryotherapy, Moist heat, Taping, Vasopneumatic device, Traction, Ultrasound, Ionotophoresis 4mg /ml Dexamethasone, Manual therapy, and Re-evaluation.  PLAN FOR NEXT SESSION: Assess and progress HEP as indicated, core stability, balance, aquatic PT   Reather Laurence, PT 07/20/2022, 12:53 PM   [facility] [address], [address] [place], [place] [address] Phone # [phone] Fax [phone]

## 2022-07-24 ENCOUNTER — Ambulatory Visit (HOSPITAL_BASED_OUTPATIENT_CLINIC_OR_DEPARTMENT_OTHER): Payer: Self-pay | Admitting: Physical Therapy

## 2022-07-27 ENCOUNTER — Other Ambulatory Visit: Payer: Self-pay

## 2022-07-27 ENCOUNTER — Encounter: Payer: Self-pay | Admitting: Family Medicine

## 2022-07-27 DIAGNOSIS — R29898 Other symptoms and signs involving the musculoskeletal system: Secondary | ICD-10-CM

## 2022-07-27 MED ORDER — GABAPENTIN 100 MG PO CAPS: 200.0000 mg | ORAL_CAPSULE | Freq: Two times a day (BID) | ORAL | 0 refills | Status: DC

## 2022-08-02 ENCOUNTER — Other Ambulatory Visit: Payer: Self-pay | Admitting: Family Medicine

## 2022-08-02 DIAGNOSIS — I639 Cerebral infarction, unspecified: Secondary | ICD-10-CM

## 2022-08-02 NOTE — Progress Notes (Signed)
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

## 2022-08-03 ENCOUNTER — Other Ambulatory Visit: Payer: Self-pay | Admitting: Internal Medicine

## 2022-08-04 ENCOUNTER — Encounter (HOSPITAL_BASED_OUTPATIENT_CLINIC_OR_DEPARTMENT_OTHER): Payer: Self-pay | Admitting: Physical Therapy

## 2022-08-04 ENCOUNTER — Ambulatory Visit
Admission: RE | Admit: 2022-08-04 | Discharge: 2022-08-04 | Disposition: A | Discharge status: Home / Self Care | Payer: 59 | Source: Ambulatory Visit | Attending: Family Medicine | Admitting: Family Medicine

## 2022-08-04 ENCOUNTER — Encounter (HOSPITAL_BASED_OUTPATIENT_CLINIC_OR_DEPARTMENT_OTHER): Payer: 59 | Attending: Family Medicine | Admitting: Physical Therapy

## 2022-08-04 DIAGNOSIS — M5459 Other low back pain: Secondary | ICD-10-CM | POA: Insufficient documentation

## 2022-08-04 DIAGNOSIS — M542 Cervicalgia: Secondary | ICD-10-CM | POA: Diagnosis present

## 2022-08-04 DIAGNOSIS — M6281 Muscle weakness (generalized): Secondary | ICD-10-CM | POA: Diagnosis present

## 2022-08-04 DIAGNOSIS — R252 Cramp and spasm: Secondary | ICD-10-CM | POA: Insufficient documentation

## 2022-08-04 DIAGNOSIS — I639 Cerebral infarction, unspecified: Secondary | ICD-10-CM

## 2022-08-04 MED ORDER — GADOPICLENOL 0.5 MMOL/ML IV SOLN
10.0000 mL | Freq: Once | INTRAVENOUS | Status: AC | PRN
  Administered 2022-08-04: 10 mL via INTRAVENOUS

## 2022-08-04 NOTE — Therapy (Signed)
OUTPATIENT PHYSICAL THERAPY TREATMENT NOTE   Patient Name: Timothy Leblanc MRN: [id number] DOB:[birth year], 62 y.o., male Today's Date: 08/04/2022  END OF SESSION:  PT End of Session - 08/04/22 0810     Visit Number 3    Date for PT Re-Evaluation 09/08/22    Authorization Type [employer]    PT Start Time 0815    PT Stop Time 0855    PT Time Calculation (min) 40 min    Activity Tolerance Patient tolerated treatment well    Behavior During Therapy [facility] for tasks assessed/performed             Past Medical History:  Diagnosis Date   Diverticulitis 2016   Past Surgical History:  Procedure Laterality Date   COLONOSCOPY  2015   HYDROCELE EXCISION Right 09/08/2019   Procedure: HYDROCELE REPAIR;  Surgeon: Heloise Purpura, MD;  Location: [facility];  Service: Urology;  Laterality: Right;   IR 3D INDEPENDENT WKST  07/01/2021   IR ANGIO INTRA EXTRACRAN SEL COM CAROTID INNOMINATE UNI L MOD SED  07/01/2021   IR ANGIO INTRA EXTRACRAN SEL INTERNAL CAROTID UNI R MOD SED  07/01/2021   IR ANGIO VERTEBRAL SEL SUBCLAVIAN INNOMINATE UNI L MOD SED  07/01/2021   IR ANGIO VERTEBRAL SEL VERTEBRAL UNI R MOD SED  07/01/2021   IR RADIOLOGIST EVAL & MGMT  10/04/2021   IR US GUIDE VASC ACCESS RIGHT  07/01/2021   VASECTOMY  1996   Patient Active Problem List   Diagnosis Date Noted   Degenerative disc disease, lumbar 06/06/2022   Degenerative cervical disc 06/06/2022   Interstitial pulmonary disease (HCC) 09/07/2021   Stroke (HCC) 06/30/2021   Stroke (cerebrum) (HCC) 06/30/2021   Acute ischemic stroke (HCC) 06/30/2021    PCP: Ozella Rocks, MD  REFERRING PROVIDER: Judi Saa, DO  REFERRING DIAG: M51.36 (ICD-10-CM) - Degenerative disc disease, lumbar  Rationale for Evaluation and Treatment: Rehabilitation  THERAPY DIAG:  Other low back pain  Cervicalgia  Cramp and spasm  Muscle weakness (generalized)  ONSET DATE: at least 5 years, but has gotten worse in the past 2 years  SUBJECTIVE:                                                                                                                                                                                            SUBJECTIVE STATEMENT: Pt reports he had a good night, so pain is minimal. He reports compliance with HEP   PERTINENT HISTORY:  OA, Degenerative Disc Disease, CVA in 2023  PAIN:  Are you having pain? Yes: NPRS scale: back 1/10 Pain location: Primarily low back, occasionally  cervical Pain description: varies between sharp to dull ache Aggravating factors: washing car, yard work Relieving factors: medication, rest  PRECAUTIONS: None  WEIGHT BEARING RESTRICTIONS: No  FALLS:  Has patient fallen in last 6 months? Yes. Number of falls 1 fall when working on his deck and walking across beams  LIVING ENVIRONMENT: Lives with: lives with their family and lives with their spouse Lives in: House/apartment Stairs: Yes: Internal: 12 steps; on left going up and External: 3 steps; bilateral but cannot reach both Has following equipment at home: None  OCCUPATION: Administrator, arts (works at a computer primarily)  PLOF: Independent and Leisure: playing golf, tennis, running.  However, has not been able to do secondary to pain.  PATIENT GOALS: Would like to be able to return to more active lifestyle of running and other desired sports.  NEXT MD VISIT:   OBJECTIVE:   DIAGNOSTIC FINDINGS:  Lumbar Radiograph on 06/06/2022: IMPRESSION: Degenerative changes. No acute osseous abnormalities.  Cervical Radiograph on 06/06/2022: IMPRESSION: Degenerative changes. No acute osseous abnormalities.  PATIENT SURVEYS:  Eval:  FOTO 56% (projected 62% by visit 10)  SCREENING FOR RED FLAGS: Bowel or bladder incontinence: No Spinal tumors: No Cauda equina syndrome: No Compression fracture: No Abdominal aneurysm: No  COGNITION: Overall cognitive status: Within functional limits for tasks  assessed     SENSATION: Reports occasionally has numbness and tingling in left hand  MUSCLE LENGTH: Hamstrings: slight tightness, but WFL  POSTURE: rounded shoulders and forward head  PALPATION: Pt with some muscle tightness noted in lumbar paraspinals.  Pt with reported SI joint pain.  LUMBAR ROM:   WFL with increased pain.  Reports that his neck "locks up" sometimes when he looks down.  LOWER EXTREMITY ROM:     WFL  UPPER/LOWER EXTREMITY MMT:    07/14/2022: UE is grossly WFL BLE strength is grossly at least 5-/5, except hip IR/ER are 4/5 bilat  LUMBAR SPECIAL TESTS:  Eval:  Slump test: Positive on left  FUNCTIONAL TESTS:  Eval: 5 times sit to stand: 9.07 sec Single Leg Stance:  Right- 7.87 sec, Left- 16.94 sec   GAIT: Distance walked: >500 ft Assistive device utilized: None Level of assistance: Complete Independence Comments: Patient does not report pain with walking, only jogging/running  TODAY'S TREATMENT:                                                                                                                              DATE: 08/04/2022  * intro to aquatic therapy principles * in 4+ ft of water, without support:  walking forward/backward with cues for reciprocal arm swing and vertical trunk * side stepping with arm addct/abdct, repeated with yellow hand floats (Rt LB discomfort with R side stepping with float) * farmer carry with bilat/single yellow hand floats under water * UE on yellow float with SLS, leg swings into hip flex/ext and hip abdct/add * seated balance on noodle in deeper water with single  hand raise * straddling noodle and cycling with breast stroke arms * b KTC stretch with hands on rails and feet in 1st/2nd ladder hole * L stretch, cues for technique * hip hinge with hip bumps to wall with forward arm reach x 8 * single leg forward reach into superman arms  x 8 each side   DATE: 07/20/2022  UBE level 1.0 x2 min each direction with PT  present to discuss status Standing rows with red tband 2x10 with 1 sec hold Seated cervical retraction isometric with ball behind head 2x10 Standing facing wall performing lower trap lift off 2x10 Standing hamstring stretch at stairs 2x20 sec bilat Sitting on blue pball:  CW and CCW pelvic rotations x10 bilat Sitting on blue pball:  marching and LAQ 2x10 each bilat (had some pain during and needed to stand up to reposition himself) Quadruped cat/cow x5 3 way child's pose x20 sec bilat Hooklying posterior pelvic tilt 2x10 Manual Therapy:  Addaday to bilateral lumbar paraspinals, bilateral glutes and hamstrings. Education on dry needling and self manual glute/piriformis trigger point release using a lacrosse ball standing against wall.   DATE: 07/14/2022  Review of HEP (see below), information provided on aquatic PT   PATIENT EDUCATION:  Education details: Issued HEP Person educated: Patient Education method: Explanation, Demonstration, and Handouts Education comprehension: verbalized understanding  HOME EXERCISE PROGRAM: Access Code: [email or web address] URL: [email or web address] Date: 07/14/2022 Prepared by: Clydie Braun Menke  Exercises - Seated Scapular Retraction  - 1 x daily - 7 x weekly - 2 sets - 10 reps - Seated Cervical Retraction  - 1 x daily - 7 x weekly - 2 sets - 10 reps - Pelvic Circles on Swiss Ball  - 1 x daily - 7 x weekly - 2 sets - 10 reps - Swiss Ball March  - 1 x daily - 7 x weekly - 2 sets - 10 reps - Swiss Ball Knee Extension  - 1 x daily - 7 x weekly - 2 sets - 10 reps - Single Leg Stance with Support  - 1 x daily - 7 x weekly - 2 sets - 3 reps - 30 sec hold  ASSESSMENT:  CLINICAL IMPRESSION: Pt is confident in aquatic environment and able to take direction from therapist on deck.  He required some cuing for posture and positioning with exercises for improved technique.  Overall good tolerance for aquatic exercises while submerged 70%. He did report mild  increase in R LB discomfort during Rt sidestepping with arm resistance as well as hip hinges with forward arm reach.  Will plan to create aquatic HEP, if pt reports positive response and interest in continuing these exercises outside of therapy sessions.  Pt continues to require skilled PT to progress towards goal related activities. Goals are ongoing.   OBJECTIVE IMPAIRMENTS: decreased balance, decreased strength, increased muscle spasms, postural dysfunction, and pain.   ACTIVITY LIMITATIONS: carrying, reach over head, and washing his car  PARTICIPATION LIMITATIONS: community activity and yard work  PERSONAL FACTORS: Profession, Time since onset of injury/illness/exacerbation, and 3+ comorbidities: CVA in 2023, OA, DDD  are also affecting patient's functional outcome.   REHAB POTENTIAL: Good  CLINICAL DECISION MAKING: Evolving/moderate complexity  EVALUATION COMPLEXITY: Moderate   GOALS: Goals reviewed with patient? Yes  SHORT TERM GOALS: Target date: 08/02/2022  Patient will be independent with initial HEP. Baseline: Goal status: MET  2.  Patient will report at least a 30% improvement in symptoms. Baseline:  Goal status: IN PROGRESS   LONG  TERM GOALS: Target date: 09/08/2022  Patient will be independent with advanced HEP. Baseline:  Goal status: INITIAL  2.  Patient will increase FOTO to at least 62% to demonstrate improvements in functional mobility. Baseline: 56% Goal status: INITIAL  3.  Patient will report ability to return to occasional jogging and/or playing golf without increased pain. Baseline:  Goal status: INITIAL  4.  Patient will report ability to wash his car with pain no greater than 3/10. Baseline:  Goal status: INITIAL  5.  Patient will increase single leg stance time to at least 30 seconds to increase his stability/decrease risk of falling. Baseline: Right- 7.87 sec, Left- 16.94 sec Goal status: INITIAL   PLAN:  PT FREQUENCY: 2x/week  PT  DURATION: 8 weeks  PLANNED INTERVENTIONS: Therapeutic exercises, Therapeutic activity, Neuromuscular re-education, Balance training, Gait training, Patient/Family education, Self Care, Joint mobilization, Joint manipulation, Stair training, Aquatic Therapy, Dry Needling, Electrical stimulation, Spinal manipulation, Spinal mobilization, Cryotherapy, Moist heat, Taping, Vasopneumatic device, Traction, Ultrasound, Ionotophoresis 4mg /ml Dexamethasone, Manual therapy, and Re-evaluation.  PLAN FOR NEXT SESSION: Assess and progress HEP as indicated, core stability, balance, aquatic PT  Mayer Camel, PTA 08/04/22 9:45 AM [facility] [address] [place], [place], [address] Phone: [phone]   Fax:  [phone]

## 2022-08-11 ENCOUNTER — Encounter: Payer: Self-pay | Admitting: Rehabilitative and Restorative Service Providers"

## 2022-08-11 ENCOUNTER — Ambulatory Visit: Payer: 59 | Admitting: Rehabilitative and Restorative Service Providers"

## 2022-08-11 DIAGNOSIS — M5459 Other low back pain: Secondary | ICD-10-CM

## 2022-08-11 DIAGNOSIS — R252 Cramp and spasm: Secondary | ICD-10-CM

## 2022-08-11 DIAGNOSIS — M542 Cervicalgia: Secondary | ICD-10-CM

## 2022-08-11 DIAGNOSIS — R2689 Other abnormalities of gait and mobility: Secondary | ICD-10-CM

## 2022-08-11 DIAGNOSIS — M6281 Muscle weakness (generalized): Secondary | ICD-10-CM

## 2022-08-11 NOTE — Patient Instructions (Signed)

## 2022-08-11 NOTE — Therapy (Signed)
OUTPATIENT PHYSICAL THERAPY TREATMENT NOTE   Patient Name: Timothy Leblanc MRN: [id number] DOB:[birth year], 62 y.o., male Today's Date: 08/11/2022  END OF SESSION:  PT End of Session - 08/11/22 0757     Visit Number 4    Date for PT Re-Evaluation 09/08/22    Authorization Type U[employer]    PT Start Time 0800    PT Stop Time 0853    PT Time Calculation (min) 53 min    Activity Tolerance Patient tolerated treatment well    Behavior During Therapy [facility] for tasks assessed/performed             Past Medical History:  Diagnosis Date   Diverticulitis 2016   Past Surgical History:  Procedure Laterality Date   COLONOSCOPY  2015   HYDROCELE EXCISION Right 09/08/2019   Procedure: HYDROCELE REPAIR;  Surgeon: Heloise Purpura, MD;  Location: [facility];  Service: Urology;  Laterality: Right;   IR 3D INDEPENDENT WKST  07/01/2021   IR ANGIO INTRA EXTRACRAN SEL COM CAROTID INNOMINATE UNI L MOD SED  07/01/2021   IR ANGIO INTRA EXTRACRAN SEL INTERNAL CAROTID UNI R MOD SED  07/01/2021   IR ANGIO VERTEBRAL SEL SUBCLAVIAN INNOMINATE UNI L MOD SED  07/01/2021   IR ANGIO VERTEBRAL SEL VERTEBRAL UNI R MOD SED  07/01/2021   IR RADIOLOGIST EVAL & MGMT  10/04/2021   IR US GUIDE VASC ACCESS RIGHT  07/01/2021   VASECTOMY  1996   Patient Active Problem List   Diagnosis Date Noted   Degenerative disc disease, lumbar 06/06/2022   Degenerative cervical disc 06/06/2022   Interstitial pulmonary disease (HCC) 09/07/2021   Stroke (HCC) 06/30/2021   Stroke (cerebrum) (HCC) 06/30/2021   Acute ischemic stroke (HCC) 06/30/2021    PCP: Ozella Rocks, MD  REFERRING PROVIDER: Judi Saa, DO  REFERRING DIAG: M51.36 (ICD-10-CM) - Degenerative disc disease, lumbar  Rationale for Evaluation and Treatment: Rehabilitation  THERAPY DIAG:  Other low back pain  Cervicalgia  Cramp and spasm  Muscle weakness (generalized)  Balance problem  ONSET DATE: at least 5 years, but has gotten worse in the past 2  years  SUBJECTIVE:                                                                                                                                                                                           SUBJECTIVE STATEMENT: Pt reports his neck started hurting him yesterday and it feels like it's locking some.  Pt reports less pain on his back.  Pt states that he thinks that he overdid it.  PERTINENT HISTORY:  OA, Degenerative Disc Disease, CVA in  2023  PAIN:  Are you having pain? Yes: NPRS scale: cervical 5/10 and back 3/10 Pain location: Primarily low back, occasionally cervical Pain description: varies between sharp to dull ache Aggravating factors: washing car, yard work Relieving factors: medication, rest  PRECAUTIONS: None  WEIGHT BEARING RESTRICTIONS: No  FALLS:  Has patient fallen in last 6 months? Yes. Number of falls 1 fall when working on his deck and walking across beams  LIVING ENVIRONMENT: Lives with: lives with their family and lives with their spouse Lives in: House/apartment Stairs: Yes: Internal: 12 steps; on left going up and External: 3 steps; bilateral but cannot reach both Has following equipment at home: None  OCCUPATION: Administrator, arts (works at a computer primarily)  PLOF: Independent and Leisure: playing golf, tennis, running.  However, has not been able to do secondary to pain.  PATIENT GOALS: Would like to be able to return to more active lifestyle of running and other desired sports.  NEXT MD VISIT:   OBJECTIVE:   DIAGNOSTIC FINDINGS:  Lumbar Radiograph on 06/06/2022: IMPRESSION: Degenerative changes. No acute osseous abnormalities.  Cervical Radiograph on 06/06/2022: IMPRESSION: Degenerative changes. No acute osseous abnormalities.  PATIENT SURVEYS:  Eval:  FOTO 56% (projected 62% by visit 10)  SCREENING FOR RED FLAGS: Bowel or bladder incontinence: No Spinal tumors: No Cauda equina syndrome: No Compression fracture:  No Abdominal aneurysm: No  COGNITION: Overall cognitive status: Within functional limits for tasks assessed     SENSATION: Reports occasionally has numbness and tingling in left hand  MUSCLE LENGTH: Hamstrings: slight tightness, but WFL  POSTURE: rounded shoulders and forward head  PALPATION: Pt with some muscle tightness noted in lumbar paraspinals.  Pt with reported SI joint pain.  LUMBAR ROM:   WFL with increased pain.  Reports that his neck "locks up" sometimes when he looks down.  LOWER EXTREMITY ROM:     WFL  UPPER/LOWER EXTREMITY MMT:    07/14/2022: UE is grossly WFL BLE strength is grossly at least 5-/5, except hip IR/ER are 4/5 bilat  LUMBAR SPECIAL TESTS:  Eval:  Slump test: Positive on left  FUNCTIONAL TESTS:  Eval: 5 times sit to stand: 9.07 sec Single Leg Stance:  Right- 7.87 sec, Left- 16.94 sec   GAIT: Distance walked: >500 ft Assistive device utilized: None Level of assistance: Complete Independence Comments: Patient does not report pain with walking, only jogging/running  TODAY'S TREATMENT:                                                                                                                               DATE: 08/11/2022  Nustep level 5 x6 min with PT present to discuss status Standing lower trap lift off 2x10 Seated cervical retraction isometric with ball behind head 2x10 Seated cervical SNAGs with towel x10 Standing hamstring stretch at stairs 2x20 sec bilat Standing scapular retractions 2x10 Seated thoracic extension 2x 30 sec hold (stopped due to pain) Quadruped cat and  cows 2 reps Cervical Traction:  Intermittent x13 min with 15# max and 5# min. Trigger Point Dry-Needling  Treatment instructions: Expect mild to moderate muscle soreness. S/S of pneumothorax if dry needled over a lung field, and to seek immediate medical attention should they occur. Patient verbalized understanding of these instructions and education. Patient  Consent Given: Yes Education handout provided: Yes Muscles treated: bilateral lumbar multifidi Electrical stimulation performed: No Parameters: N/A Treatment response/outcome: Utilized skilled palpation to identify bony landmarks and trigger points.  Able to illicit twitch response and muscle elongation.  Soft tissue mobilization following to further promote tissue elongation.    DATE: 08/04/2022  * intro to aquatic therapy principles * in 4+ ft of water, without support:  walking forward/backward with cues for reciprocal arm swing and vertical trunk * side stepping with arm addct/abdct, repeated with yellow hand floats (Rt LB discomfort with R side stepping with float) * farmer carry with bilat/single yellow hand floats under water * UE on yellow float with SLS, leg swings into hip flex/ext and hip abdct/add * seated balance on noodle in deeper water with single hand raise * straddling noodle and cycling with breast stroke arms * b KTC stretch with hands on rails and feet in 1st/2nd ladder hole * L stretch, cues for technique * hip hinge with hip bumps to wall with forward arm reach x 8 * single leg forward reach into superman arms  x 8 each side   DATE: 07/20/2022  UBE level 1.0 x2 min each direction with PT present to discuss status Standing rows with red tband 2x10 with 1 sec hold Seated cervical retraction isometric with ball behind head 2x10 Standing facing wall performing lower trap lift off 2x10 Standing hamstring stretch at stairs 2x20 sec bilat Sitting on blue pball:  CW and CCW pelvic rotations x10 bilat Sitting on blue pball:  marching and LAQ 2x10 each bilat (had some pain during and needed to stand up to reposition himself) Quadruped cat/cow x5 3 way child's pose x20 sec bilat Hooklying posterior pelvic tilt 2x10 Manual Therapy:  Addaday to bilateral lumbar paraspinals, bilateral glutes and hamstrings. Education on dry needling and self manual glute/piriformis trigger  point release using a lacrosse ball standing against wall.    PATIENT EDUCATION:  Education details: Issued HEP Person educated: Patient Education method: Explanation, Facilities manager, and Handouts Education comprehension: verbalized understanding  HOME EXERCISE PROGRAM: Access Code: [email or web address] URL: [email or web address] Date: 07/14/2022 Prepared by: Reather Laurence  Exercises - Seated Scapular Retraction  - 1 x daily - 7 x weekly - 2 sets - 10 reps - Seated Cervical Retraction  - 1 x daily - 7 x weekly - 2 sets - 10 reps - Pelvic Circles on Swiss Ball  - 1 x daily - 7 x weekly - 2 sets - 10 reps - Swiss Ball March  - 1 x daily - 7 x weekly - 2 sets - 10 reps - Swiss Ball Knee Extension  - 1 x daily - 7 x weekly - 2 sets - 10 reps - Single Leg Stance with Support  - 1 x daily - 7 x weekly - 2 sets - 3 reps - 30 sec hold  ASSESSMENT:  CLINICAL IMPRESSION: Pt presented to clinic with moderate complaints of increased pain in the neck and back throughout today's session. Gentle stretching and isometrics for the shoulder and neck continued to be intolerable with increasing irritability throughout. Muscle guarding and postural dysfunction was evident in all interventions even with  tactile and verbal cuing given for corrections. Patient responded well to verbal cues for breath work in order to help the tissue extensibility in the cervical region, however, continued to report complaints of global hypomobility and "locking up" of his shoulders and neck. Manual traction was administered to allow for reduction in compression on spinal nerve roots likely contributing to global tension. In addition, patient responded well to cervical traction and dry needling/manual to lumbar multifidi.  Patient with reported 0/10 pain in cervical region and 1/10 pain in lumbar region at completion of visit.  OBJECTIVE IMPAIRMENTS: decreased balance, decreased strength, increased muscle spasms, postural  dysfunction, and pain.   ACTIVITY LIMITATIONS: carrying, reach over head, and washing his car  PARTICIPATION LIMITATIONS: community activity and yard work  PERSONAL FACTORS: Profession, Time since onset of injury/illness/exacerbation, and 3+ comorbidities: CVA in 2023, OA, DDD  are also affecting patient's functional outcome.   REHAB POTENTIAL: Good  CLINICAL DECISION MAKING: Evolving/moderate complexity  EVALUATION COMPLEXITY: Moderate   GOALS: Goals reviewed with patient? Yes  SHORT TERM GOALS: Target date: 08/02/2022  Patient will be independent with initial HEP. Baseline: Goal status: MET  2.  Patient will report at least a 30% improvement in symptoms. Baseline:  Goal status: IN PROGRESS   LONG TERM GOALS: Target date: 09/08/2022  Patient will be independent with advanced HEP. Baseline:  Goal status: INITIAL  2.  Patient will increase FOTO to at least 62% to demonstrate improvements in functional mobility. Baseline: 56% Goal status: INITIAL  3.  Patient will report ability to return to occasional jogging and/or playing golf without increased pain. Baseline:  Goal status: INITIAL  4.  Patient will report ability to wash his car with pain no greater than 3/10. Baseline:  Goal status: INITIAL  5.  Patient will increase single leg stance time to at least 30 seconds to increase his stability/decrease risk of falling. Baseline: Right- 7.87 sec, Left- 16.94 sec Goal status: INITIAL   PLAN:  PT FREQUENCY: 2x/week  PT DURATION: 8 weeks  PLANNED INTERVENTIONS: Therapeutic exercises, Therapeutic activity, Neuromuscular re-education, Balance training, Gait training, Patient/Family education, Self Care, Joint mobilization, Joint manipulation, Stair training, Aquatic Therapy, Dry Needling, Electrical stimulation, Spinal manipulation, Spinal mobilization, Cryotherapy, Moist heat, Taping, Vasopneumatic device, Traction, Ultrasound, Ionotophoresis 4mg /ml Dexamethasone,  Manual therapy, and Re-evaluation.  PLAN FOR NEXT SESSION: Assess and progress HEP as indicated, Follow up with traction and dry needling/manual therapy, core stability, balance, aquatic PT    Reather Laurence, PT, DPT 08/11/22, 9:40 AM  [facility] [address] [place], [place] [address] Phone # [phone] Fax [phone]

## 2022-08-14 ENCOUNTER — Encounter (HOSPITAL_BASED_OUTPATIENT_CLINIC_OR_DEPARTMENT_OTHER): Payer: Self-pay | Admitting: Physical Therapy

## 2022-08-14 ENCOUNTER — Ambulatory Visit (HOSPITAL_BASED_OUTPATIENT_CLINIC_OR_DEPARTMENT_OTHER): Payer: 59 | Attending: Family Medicine | Admitting: Physical Therapy

## 2022-08-14 DIAGNOSIS — M542 Cervicalgia: Secondary | ICD-10-CM | POA: Insufficient documentation

## 2022-08-14 DIAGNOSIS — M6281 Muscle weakness (generalized): Secondary | ICD-10-CM | POA: Diagnosis present

## 2022-08-14 DIAGNOSIS — R252 Cramp and spasm: Secondary | ICD-10-CM | POA: Insufficient documentation

## 2022-08-14 DIAGNOSIS — M5459 Other low back pain: Secondary | ICD-10-CM | POA: Diagnosis present

## 2022-08-14 NOTE — Therapy (Signed)
OUTPATIENT PHYSICAL THERAPY TREATMENT NOTE   Patient Name: Timothy Leblanc MRN: [id number] DOB:09-19-1960, 62 y.o., male Today's Date: 08/14/2022  END OF SESSION:  PT End of Session - 08/14/22 0814     Visit Number 5    Date for PT Re-Evaluation 09/08/22    Authorization Type [employer]    PT Start Time 0815    PT Stop Time 0900    PT Time Calculation (min) 45 min    Activity Tolerance Patient tolerated treatment well    Behavior During Therapy [facility] for tasks assessed/performed             Past Medical History:  Diagnosis Date   Diverticulitis 2016   Past Surgical History:  Procedure Laterality Date   COLONOSCOPY  2015   HYDROCELE EXCISION Right 09/08/2019   Procedure: HYDROCELE REPAIR;  Surgeon: Heloise Purpura, MD;  Location: [facility];  Service: Urology;  Laterality: Right;   IR 3D INDEPENDENT WKST  07/01/2021   IR ANGIO INTRA EXTRACRAN SEL COM CAROTID INNOMINATE UNI L MOD SED  07/01/2021   IR ANGIO INTRA EXTRACRAN SEL INTERNAL CAROTID UNI R MOD SED  07/01/2021   IR ANGIO VERTEBRAL SEL SUBCLAVIAN INNOMINATE UNI L MOD SED  07/01/2021   IR ANGIO VERTEBRAL SEL VERTEBRAL UNI R MOD SED  07/01/2021   IR RADIOLOGIST EVAL & MGMT  10/04/2021   IR US GUIDE VASC ACCESS RIGHT  07/01/2021   VASECTOMY  1996   Patient Active Problem List   Diagnosis Date Noted   Degenerative disc disease, lumbar 06/06/2022   Degenerative cervical disc 06/06/2022   Interstitial pulmonary disease (HCC) 09/07/2021   Stroke (HCC) 06/30/2021   Stroke (cerebrum) (HCC) 06/30/2021   Acute ischemic stroke (HCC) 06/30/2021    PCP: Ozella Rocks, MD  REFERRING PROVIDER: Judi Saa, DO  REFERRING DIAG: M51.36 (ICD-10-CM) - Degenerative disc disease, lumbar  Rationale for Evaluation and Treatment: Rehabilitation  THERAPY DIAG:  Other low back pain  Cervicalgia  Cramp and spasm  Muscle weakness (generalized)  ONSET DATE: at least 5 years, but has gotten worse in the past 2 years  SUBJECTIVE:                                                                                                                                                                                            SUBJECTIVE STATEMENT: Good response to last session with reduction in cervical "locking" and tightness. Presents today with reports of low pain   PERTINENT HISTORY:  OA, Degenerative Disc Disease, CVA in 2023  PAIN:  Are you having pain? Yes: NPRS scale: cervical 0/10 and back 2/10  Pain location: Primarily low back, occasionally cervical Pain description: varies between sharp to dull ache Aggravating factors: washing car, yard work Relieving factors: medication, rest  PRECAUTIONS: None  WEIGHT BEARING RESTRICTIONS: No  FALLS:  Has patient fallen in last 6 months? Yes. Number of falls 1 fall when working on his deck and walking across beams  LIVING ENVIRONMENT: Lives with: lives with their family and lives with their spouse Lives in: House/apartment Stairs: Yes: Internal: 12 steps; on left going up and External: 3 steps; bilateral but cannot reach both Has following equipment at home: None  OCCUPATION: Administrator, arts (works at a computer primarily)  PLOF: Independent and Leisure: playing golf, tennis, running.  However, has not been able to do secondary to pain.  PATIENT GOALS: Would like to be able to return to more active lifestyle of running and other desired sports.  NEXT MD VISIT:   OBJECTIVE:   DIAGNOSTIC FINDINGS:  Lumbar Radiograph on 06/06/2022: IMPRESSION: Degenerative changes. No acute osseous abnormalities.  Cervical Radiograph on 06/06/2022: IMPRESSION: Degenerative changes. No acute osseous abnormalities.  PATIENT SURVEYS:  Eval:  FOTO 56% (projected 62% by visit 10)  SCREENING FOR RED FLAGS: Bowel or bladder incontinence: No Spinal tumors: No Cauda equina syndrome: No Compression fracture: No Abdominal aneurysm: No  COGNITION: Overall cognitive status:  Within functional limits for tasks assessed     SENSATION: Reports occasionally has numbness and tingling in left hand  MUSCLE LENGTH: Hamstrings: slight tightness, but WFL  POSTURE: rounded shoulders and forward head  PALPATION: Pt with some muscle tightness noted in lumbar paraspinals.  Pt with reported SI joint pain.  LUMBAR ROM:   WFL with increased pain.  Reports that his neck "locks up" sometimes when he looks down.  LOWER EXTREMITY ROM:     WFL  UPPER/LOWER EXTREMITY MMT:    07/14/2022: UE is grossly WFL BLE strength is grossly at least 5-/5, except hip IR/ER are 4/5 bilat  LUMBAR SPECIAL TESTS:  Eval:  Slump test: Positive on left  FUNCTIONAL TESTS:  Eval: 5 times sit to stand: 9.07 sec Single Leg Stance:  Right- 7.87 sec, Left- 16.94 sec   GAIT: Distance walked: >500 ft Assistive device utilized: None Level of assistance: Complete Independence Comments: Patient does not report pain with walking, only jogging/running  TODAY'S TREATMENT:                                                                                                                              DATE: 08/14/2022   Pt seen for aquatic therapy today.  Treatment took place in water 3.5-4.75 ft in depth at the [facility] pool. Temp of water was 91.  Pt entered/exited the pool via stairs using step to pattern with hand rail.  * in 4+ ft of water, without support:  walking forward/backward with cues for reciprocal arm swing and vertical trunk * b KTC stretch with hands on rails and feet in 1st/2nd ladder  hole * L stretch, cues for technique; Added tail wagging x 3 * farmer carry with bilat/single yellow hand floats under water *TrA sets using solid then big yellow noodle wide stance then staggered moving from 4.62ft to 4.3 x10 ea *Noodle kick downs with solid noodle hip neutral then externally rotated 10 slow/10 quick uw support yellow HB *core engagement: standing on noodle: translating over  noodle balancing with heels touching then toes; balancing directly on noodle ue initially on wall then supported with HB then without. Completed in 4.2 ft (good challenge)   Pt requires the buoyancy and hydrostatic pressure of water for support, and to offload joints by unweighting joint load by at least 50 % in navel deep water and by at least 75-80% in chest to neck deep water.  Viscosity of the water is needed for resistance of strengthening. Water current perturbations provides challenge to standing balance requiring increased core activation.     DATE: 08/11/2022  Nustep level 5 x6 min with PT present to discuss status Standing lower trap lift off 2x10 Seated cervical retraction isometric with ball behind head 2x10 Seated cervical SNAGs with towel x10 Standing hamstring stretch at stairs 2x20 sec bilat Standing scapular retractions 2x10 Seated thoracic extension 2x 30 sec hold (stopped due to pain) Quadruped cat and cows 2 reps Cervical Traction:  Intermittent x13 min with 15# max and 5# min. Trigger Point Dry-Needling  Treatment instructions: Expect mild to moderate muscle soreness. S/S of pneumothorax if dry needled over a lung field, and to seek immediate medical attention should they occur. Patient verbalized understanding of these instructions and education. Patient Consent Given: Yes Education handout provided: Yes Muscles treated: bilateral lumbar multifidi Electrical stimulation performed: No Parameters: N/A Treatment response/outcome: Utilized skilled palpation to identify bony landmarks and trigger points.  Able to illicit twitch response and muscle elongation.  Soft tissue mobilization following to further promote tissue elongation.    DATE: 08/04/2022  * intro to aquatic therapy principles * in 4+ ft of water, without support:  walking forward/backward with cues for reciprocal arm swing and vertical trunk * side stepping with arm addct/abdct, repeated with yellow hand  floats (Rt LB discomfort with R side stepping with float) * farmer carry with bilat/single yellow hand floats under water * UE on yellow float with SLS, leg swings into hip flex/ext and hip abdct/add * seated balance on noodle in deeper water with single hand raise * straddling noodle and cycling with breast stroke arms * b KTC stretch with hands on rails and feet in 1st/2nd ladder hole * L stretch, cues for technique * hip hinge with hip bumps to wall with forward arm reach x 8 * single leg forward reach into superman arms  x 8 each side   DATE: 07/20/2022  UBE level 1.0 x2 min each direction with PT present to discuss status Standing rows with red tband 2x10 with 1 sec hold Seated cervical retraction isometric with ball behind head 2x10 Standing facing wall performing lower trap lift off 2x10 Standing hamstring stretch at stairs 2x20 sec bilat Sitting on blue pball:  CW and CCW pelvic rotations x10 bilat Sitting on blue pball:  marching and LAQ 2x10 each bilat (had some pain during and needed to stand up to reposition himself) Quadruped cat/cow x5 3 way child's pose x20 sec bilat Hooklying posterior pelvic tilt 2x10 Manual Therapy:  Addaday to bilateral lumbar paraspinals, bilateral glutes and hamstrings. Education on dry needling and self manual glute/piriformis trigger point release  using a lacrosse ball standing against wall.    PATIENT EDUCATION:  Education details: Issued HEP Person educated: Patient Education method: Explanation, Facilities manager, and Handouts Education comprehension: verbalized understanding  HOME EXERCISE PROGRAM: Access Code: [email or web address] URL: [email or web address] Date: 07/14/2022 Prepared by: Clydie Braun Menke  Exercises - Seated Scapular Retraction  - 1 x daily - 7 x weekly - 2 sets - 10 reps - Seated Cervical Retraction  - 1 x daily - 7 x weekly - 2 sets - 10 reps - Pelvic Circles on Swiss Ball  - 1 x daily - 7 x weekly - 2 sets - 10 reps -  Swiss Ball March  - 1 x daily - 7 x weekly - 2 sets - 10 reps - Swiss Ball Knee Extension  - 1 x daily - 7 x weekly - 2 sets - 10 reps - Single Leg Stance with Support  - 1 x daily - 7 x weekly - 2 sets - 3 reps - 30 sec hold  ASSESSMENT:  CLINICAL IMPRESSION: Progressed core strengthening adding  ue engagement with LE movements.  Proves to be excellent challenge. He is instructed on techniques to increase and decrease challenge using depth and speed of movement. Cues throughout for core stabilization. He puts for good effort.  No reports of discomfort and reduction in total pain  to 0/10 upon completion for cervical spine and LB.  He does report fatigue of core by end of session     OBJECTIVE IMPAIRMENTS: decreased balance, decreased strength, increased muscle spasms, postural dysfunction, and pain.   ACTIVITY LIMITATIONS: carrying, reach over head, and washing his car  PARTICIPATION LIMITATIONS: community activity and yard work  PERSONAL FACTORS: Profession, Time since onset of injury/illness/exacerbation, and 3+ comorbidities: CVA in 2023, OA, DDD  are also affecting patient's functional outcome.   REHAB POTENTIAL: Good  CLINICAL DECISION MAKING: Evolving/moderate complexity  EVALUATION COMPLEXITY: Moderate   GOALS: Goals reviewed with patient? Yes  SHORT TERM GOALS: Target date: 08/02/2022  Patient will be independent with initial HEP. Baseline: Goal status: MET  2.  Patient will report at least a 30% improvement in symptoms. Baseline:  Goal status: IN PROGRESS   LONG TERM GOALS: Target date: 09/08/2022  Patient will be independent with advanced HEP. Baseline:  Goal status: INITIAL  2.  Patient will increase FOTO to at least 62% to demonstrate improvements in functional mobility. Baseline: 56% Goal status: INITIAL  3.  Patient will report ability to return to occasional jogging and/or playing golf without increased pain. Baseline:  Goal status: INITIAL  4.   Patient will report ability to wash his car with pain no greater than 3/10. Baseline:  Goal status: INITIAL  5.  Patient will increase single leg stance time to at least 30 seconds to increase his stability/decrease risk of falling. Baseline: Right- 7.87 sec, Left- 16.94 sec Goal status: INITIAL   PLAN:  PT FREQUENCY: 2x/week  PT DURATION: 8 weeks  PLANNED INTERVENTIONS: Therapeutic exercises, Therapeutic activity, Neuromuscular re-education, Balance training, Gait training, Patient/Family education, Self Care, Joint mobilization, Joint manipulation, Stair training, Aquatic Therapy, Dry Needling, Electrical stimulation, Spinal manipulation, Spinal mobilization, Cryotherapy, Moist heat, Taping, Vasopneumatic device, Traction, Ultrasound, Ionotophoresis 4mg /ml Dexamethasone, Manual therapy, and Re-evaluation.  PLAN FOR NEXT SESSION: Assess and progress HEP as indicated, Follow up with traction and dry needling/manual therapy, core stability, balance, aquatic PT   Christalyn Goertz (Frankie) Floride Hutmacher MPT 08/14/22, 8:15 AM

## 2022-08-18 ENCOUNTER — Ambulatory Visit: Payer: 59 | Attending: Family Medicine | Admitting: Physical Therapy

## 2022-08-18 DIAGNOSIS — M6281 Muscle weakness (generalized): Secondary | ICD-10-CM | POA: Insufficient documentation

## 2022-08-18 DIAGNOSIS — M5459 Other low back pain: Secondary | ICD-10-CM | POA: Insufficient documentation

## 2022-08-18 DIAGNOSIS — R2689 Other abnormalities of gait and mobility: Secondary | ICD-10-CM | POA: Insufficient documentation

## 2022-08-18 DIAGNOSIS — R252 Cramp and spasm: Secondary | ICD-10-CM | POA: Insufficient documentation

## 2022-08-18 DIAGNOSIS — M542 Cervicalgia: Secondary | ICD-10-CM | POA: Insufficient documentation

## 2022-08-18 NOTE — Therapy (Signed)
OUTPATIENT PHYSICAL THERAPY TREATMENT NOTE   Patient Name: Timothy Leblanc MRN: [id number] DOB:October 15, 1960, 62 y.o., male Today's Date: 08/18/2022  END OF SESSION:  PT End of Session - 08/18/22 0755     Visit Number 6    Date for PT Re-Evaluation 09/08/22    Authorization Type [employer]    PT Start Time 0800    PT Stop Time 0845    PT Time Calculation (min) 45 min    Activity Tolerance Patient tolerated treatment well    Behavior During Therapy [facility] for tasks assessed/performed              Past Medical History:  Diagnosis Date   Diverticulitis 2016   Past Surgical History:  Procedure Laterality Date   COLONOSCOPY  2015   HYDROCELE EXCISION Right 09/08/2019   Procedure: HYDROCELE REPAIR;  Surgeon: Heloise Purpura, MD;  Location: [facility];  Service: Urology;  Laterality: Right;   IR 3D INDEPENDENT WKST  07/01/2021   IR ANGIO INTRA EXTRACRAN SEL COM CAROTID INNOMINATE UNI L MOD SED  07/01/2021   IR ANGIO INTRA EXTRACRAN SEL INTERNAL CAROTID UNI R MOD SED  07/01/2021   IR ANGIO VERTEBRAL SEL SUBCLAVIAN INNOMINATE UNI L MOD SED  07/01/2021   IR ANGIO VERTEBRAL SEL VERTEBRAL UNI R MOD SED  07/01/2021   IR RADIOLOGIST EVAL & MGMT  10/04/2021   IR US GUIDE VASC ACCESS RIGHT  07/01/2021   VASECTOMY  1996   Patient Active Problem List   Diagnosis Date Noted   Degenerative disc disease, lumbar 06/06/2022   Degenerative cervical disc 06/06/2022   Interstitial pulmonary disease (HCC) 09/07/2021   Stroke (HCC) 06/30/2021   Stroke (cerebrum) (HCC) 06/30/2021   Acute ischemic stroke (HCC) 06/30/2021    PCP: Ozella Rocks, MD  REFERRING PROVIDER: Judi Saa, DO  REFERRING DIAG: M51.36 (ICD-10-CM) - Degenerative disc disease, lumbar  Rationale for Evaluation and Treatment: Rehabilitation  THERAPY DIAG:  Other low back pain  Cervicalgia  Cramp and spasm  Muscle weakness (generalized)  ONSET DATE: at least 5 years, but has gotten worse in the past 2 years  SUBJECTIVE:                                                                                                                                                                                            SUBJECTIVE STATEMENT: "Back started at a 5 and now down to a 2. Neck started at a 0 and then it went up to a 5 when I put on my clothes." Pt states he did some easy stretches and it went back down to a 2.  Pt reports the hot tub was helpful at [facility] to help ease    PERTINENT HISTORY:  OA, Degenerative Disc Disease, CVA in 2023  PAIN:  Are you having pain? Yes: NPRS scale: cervical 2/10 and back 2/10 Pain location: Primarily low back, occasionally cervical Pain description: varies between sharp to dull ache Aggravating factors: washing car, yard work Relieving factors: medication, rest  PRECAUTIONS: None  WEIGHT BEARING RESTRICTIONS: No  FALLS:  Has patient fallen in last 6 months? Yes. Number of falls 1 fall when working on his deck and walking across beams  LIVING ENVIRONMENT: Lives with: lives with their family and lives with their spouse Lives in: House/apartment Stairs: Yes: Internal: 12 steps; on left going up and External: 3 steps; bilateral but cannot reach both Has following equipment at home: None  OCCUPATION: Administrator, arts (works at a computer primarily)  PLOF: Independent and Leisure: playing golf, tennis, running.  However, has not been able to do secondary to pain.  PATIENT GOALS: Would like to be able to return to more active lifestyle of running and other desired sports.  NEXT MD VISIT:   OBJECTIVE:   DIAGNOSTIC FINDINGS:  Lumbar Radiograph on 06/06/2022: IMPRESSION: Degenerative changes. No acute osseous abnormalities.  Cervical Radiograph on 06/06/2022: IMPRESSION: Degenerative changes. No acute osseous abnormalities.  PATIENT SURVEYS:  Eval:  FOTO 56% (projected 62% by visit 10)     SENSATION: Reports occasionally has numbness and tingling in left  hand  MUSCLE LENGTH: Hamstrings: slight tightness, but WFL  POSTURE: rounded shoulders and forward head  PALPATION: Pt with some muscle tightness noted in lumbar paraspinals.  Pt with reported SI joint pain.  LUMBAR ROM:   WFL with increased pain.  Reports that his neck "locks up" sometimes when he looks down.  LOWER EXTREMITY ROM:     WFL  UPPER/LOWER EXTREMITY MMT:    07/14/2022: UE is grossly WFL BLE strength is grossly at least 5-/5, except hip IR/ER are 4/5 bilat  LUMBAR SPECIAL TESTS:  Eval:  Slump test: Positive on left  FUNCTIONAL TESTS:  Eval: 5 times sit to stand: 9.07 sec Single Leg Stance:  Right- 7.87 sec, Left- 16.94 sec   GAIT: Distance walked: >500 ft Assistive device utilized: None Level of assistance: Complete Independence Comments: Patient does not report pain with walking, only jogging/running  TODAY'S TREATMENT:                                                                                                                              DATE: 08/18/22 Recumbent bike L4 x 5 min for warm up Seated pball flexion 3x10 sec, L & R lateral flexion 3x10 sec Seated figure 4 stretch 2 x 30 sec Child's pose 2x20  Bird dog 2x10 Prone shoulder ext 2x10 Prone "W" x10 Prone "T" x10 Prone "Y" x5 with manual assist Manual therapy Skilled assessment and palpation for TPDN Trigger Point Dry-Needling  Treatment instructions: Expect mild to moderate  muscle soreness. S/S of pneumothorax if dry needled over a lung field, and to seek immediate medical attention should they occur. Patient verbalized understanding of these instructions and education. Patient Consent Given: Yes Education handout provided: Yes Muscles treated: bilateral lumbar multifidi, QL Electrical stimulation performed: No Parameters: N/A Treatment response/outcome: Utilized skilled palpation to identify bony landmarks and trigger points.  Able to illicit twitch response and muscle elongation.  Soft  tissue mobilization following to further promote tissue elongation.  STM & TPR bilat lumbar paraspinal and QL   DATE: 08/14/2022   Pt seen for aquatic therapy today.  Treatment took place in water 3.5-4.75 ft in depth at the [facility] pool. Temp of water was 91.  Pt entered/exited the pool via stairs using step to pattern with hand rail.  * in 4+ ft of water, without support:  walking forward/backward with cues for reciprocal arm swing and vertical trunk * b KTC stretch with hands on rails and feet in 1st/2nd ladder hole * L stretch, cues for technique; Added tail wagging x 3 * farmer carry with bilat/single yellow hand floats under water *TrA sets using solid then big yellow noodle wide stance then staggered moving from 4.25ft to 4.3 x10 ea *Noodle kick downs with solid noodle hip neutral then externally rotated 10 slow/10 quick uw support yellow HB *core engagement: standing on noodle: translating over noodle balancing with heels touching then toes; balancing directly on noodle ue initially on wall then supported with HB then without. Completed in 4.2 ft (good challenge)   Pt requires the buoyancy and hydrostatic pressure of water for support, and to offload joints by unweighting joint load by at least 50 % in navel deep water and by at least 75-80% in chest to neck deep water.  Viscosity of the water is needed for resistance of strengthening. Water current perturbations provides challenge to standing balance requiring increased core activation.    DATE: 08/11/2022  Nustep level 5 x6 min with PT present to discuss status Standing lower trap lift off 2x10 Seated cervical retraction isometric with ball behind head 2x10 Seated cervical SNAGs with towel x10 Standing hamstring stretch at stairs 2x20 sec bilat Standing scapular retractions 2x10 Seated thoracic extension 2x 30 sec hold (stopped due to pain) Quadruped cat and cows 2 reps Cervical Traction:  Intermittent x13 min with  15# max and 5# min. Trigger Point Dry-Needling  Treatment instructions: Expect mild to moderate muscle soreness. S/S of pneumothorax if dry needled over a lung field, and to seek immediate medical attention should they occur. Patient verbalized understanding of these instructions and education. Patient Consent Given: Yes Education handout provided: Yes Muscles treated: bilateral lumbar multifidi Electrical stimulation performed: No Parameters: N/A Treatment response/outcome: Utilized skilled palpation to identify bony landmarks and trigger points.  Able to illicit twitch response and muscle elongation.  Soft tissue mobilization following to further promote tissue elongation.    DATE: 08/04/2022  * intro to aquatic therapy principles * in 4+ ft of water, without support:  walking forward/backward with cues for reciprocal arm swing and vertical trunk * side stepping with arm addct/abdct, repeated with yellow hand floats (Rt LB discomfort with R side stepping with float) * farmer carry with bilat/single yellow hand floats under water * UE on yellow float with SLS, leg swings into hip flex/ext and hip abdct/add * seated balance on noodle in deeper water with single hand raise * straddling noodle and cycling with breast stroke arms * b KTC stretch with hands  on rails and feet in 1st/2nd ladder hole * L stretch, cues for technique * hip hinge with hip bumps to wall with forward arm reach x 8 * single leg forward reach into superman arms  x 8 each side    PATIENT EDUCATION:  Education details: Issued HEP Person educated: Patient Education method: Explanation, Demonstration, and Handouts Education comprehension: verbalized understanding  HOME EXERCISE PROGRAM: Access Code: [email or web address] URL: [email or web address] Date: 07/14/2022 Prepared by: Reather Laurence  Exercises - Seated Scapular Retraction  - 1 x daily - 7 x weekly - 2 sets - 10 reps - Seated Cervical Retraction  - 1 x  daily - 7 x weekly - 2 sets - 10 reps - Pelvic Circles on Swiss Ball  - 1 x daily - 7 x weekly - 2 sets - 10 reps - Swiss Ball March  - 1 x daily - 7 x weekly - 2 sets - 10 reps - Swiss Ball Knee Extension  - 1 x daily - 7 x weekly - 2 sets - 10 reps - Single Leg Stance with Support  - 1 x daily - 7 x weekly - 2 sets - 3 reps - 30 sec hold  ASSESSMENT:  CLINICAL IMPRESSION: Pt with good response to TPDN. Improved pain by end of session. Challenged by bird dog and utilizing low traps during "Y"s (required manual assist and cueing).     GOALS: Goals reviewed with patient? Yes  SHORT TERM GOALS: Target date: 08/02/2022  Patient will be independent with initial HEP. Baseline: Goal status: MET  2.  Patient will report at least a 30% improvement in symptoms. Baseline:  Goal status: IN PROGRESS   LONG TERM GOALS: Target date: 09/08/2022  Patient will be independent with advanced HEP. Baseline:  Goal status: INITIAL  2.  Patient will increase FOTO to at least 62% to demonstrate improvements in functional mobility. Baseline: 56% Goal status: INITIAL  3.  Patient will report ability to return to occasional jogging and/or playing golf without increased pain. Baseline:  Goal status: INITIAL  4.  Patient will report ability to wash his car with pain no greater than 3/10. Baseline:  Goal status: INITIAL  5.  Patient will increase single leg stance time to at least 30 seconds to increase his stability/decrease risk of falling. Baseline: Right- 7.87 sec, Left- 16.94 sec Goal status: INITIAL   PLAN:  PT FREQUENCY: 2x/week  PT DURATION: 8 weeks  PLANNED INTERVENTIONS: Therapeutic exercises, Therapeutic activity, Neuromuscular re-education, Balance training, Gait training, Patient/Family education, Self Care, Joint mobilization, Joint manipulation, Stair training, Aquatic Therapy, Dry Needling, Electrical stimulation, Spinal manipulation, Spinal mobilization, Cryotherapy, Moist  heat, Taping, Vasopneumatic device, Traction, Ultrasound, Ionotophoresis 4mg /ml Dexamethasone, Manual therapy, and Re-evaluation.  PLAN FOR NEXT SESSION: Assess and progress HEP as indicated, Follow up with traction and dry needling/manual therapy, core stability, balance, aquatic PT   Camden Knotek April Ma L Giavanni Odonovan, PT 08/18/22, 7:56 AM

## 2022-08-21 ENCOUNTER — Encounter (HOSPITAL_BASED_OUTPATIENT_CLINIC_OR_DEPARTMENT_OTHER): Payer: Self-pay | Admitting: Physical Therapy

## 2022-08-21 ENCOUNTER — Ambulatory Visit (HOSPITAL_BASED_OUTPATIENT_CLINIC_OR_DEPARTMENT_OTHER): Payer: 59 | Admitting: Physical Therapy

## 2022-08-21 DIAGNOSIS — M5459 Other low back pain: Secondary | ICD-10-CM

## 2022-08-21 DIAGNOSIS — R252 Cramp and spasm: Secondary | ICD-10-CM

## 2022-08-21 DIAGNOSIS — M6281 Muscle weakness (generalized): Secondary | ICD-10-CM

## 2022-08-21 DIAGNOSIS — M542 Cervicalgia: Secondary | ICD-10-CM

## 2022-08-21 NOTE — Therapy (Signed)
OUTPATIENT PHYSICAL THERAPY TREATMENT NOTE   Patient Name: Timothy Leblanc MRN: [id number] DOB:[birth year], 62 y.o., male Today's Date: 08/21/2022  END OF SESSION:  PT End of Session - 08/21/22 0855     Visit Number 7    Date for PT Re-Evaluation 09/08/22    Authorization Type [employer]    PT Start Time 0900    PT Stop Time 0945    PT Time Calculation (min) 45 min    Activity Tolerance Patient tolerated treatment well    Behavior During Therapy [facility] for tasks assessed/performed              Past Medical History:  Diagnosis Date   Diverticulitis 2016   Past Surgical History:  Procedure Laterality Date   COLONOSCOPY  2015   HYDROCELE EXCISION Right 09/08/2019   Procedure: HYDROCELE REPAIR;  Surgeon: Heloise Purpura, MD;  Location: [facility];  Service: Urology;  Laterality: Right;   IR 3D INDEPENDENT WKST  07/01/2021   IR ANGIO INTRA EXTRACRAN SEL COM CAROTID INNOMINATE UNI L MOD SED  07/01/2021   IR ANGIO INTRA EXTRACRAN SEL INTERNAL CAROTID UNI R MOD SED  07/01/2021   IR ANGIO VERTEBRAL SEL SUBCLAVIAN INNOMINATE UNI L MOD SED  07/01/2021   IR ANGIO VERTEBRAL SEL VERTEBRAL UNI R MOD SED  07/01/2021   IR RADIOLOGIST EVAL & MGMT  10/04/2021   IR US GUIDE VASC ACCESS RIGHT  07/01/2021   VASECTOMY  1996   Patient Active Problem List   Diagnosis Date Noted   Degenerative disc disease, lumbar 06/06/2022   Degenerative cervical disc 06/06/2022   Interstitial pulmonary disease (HCC) 09/07/2021   Stroke (HCC) 06/30/2021   Stroke (cerebrum) (HCC) 06/30/2021   Acute ischemic stroke (HCC) 06/30/2021    PCP: Ozella Rocks, MD  REFERRING PROVIDER: Judi Saa, DO  REFERRING DIAG: M51.36 (ICD-10-CM) - Degenerative disc disease, lumbar  Rationale for Evaluation and Treatment: Rehabilitation  THERAPY DIAG:  Other low back pain  Cervicalgia  Cramp and spasm  Muscle weakness (generalized)  ONSET DATE: at least 5 years, but has gotten worse in the past 2 years  SUBJECTIVE:                                                                                                                                                                                            SUBJECTIVE STATEMENT: "No pain just stiffness this morning. Considering pool membership"   PERTINENT HISTORY:  OA, Degenerative Disc Disease, CVA in 2023  PAIN:  Are you having pain? Yes: NPRS scale: cervical 0/10 and back 0/10 Pain location: Primarily low back, occasionally cervical Pain description:  varies between sharp to dull ache Aggravating factors: washing car, yard work Relieving factors: medication, rest  PRECAUTIONS: None  WEIGHT BEARING RESTRICTIONS: No  FALLS:  Has patient fallen in last 6 months? Yes. Number of falls 1 fall when working on his deck and walking across beams  LIVING ENVIRONMENT: Lives with: lives with their family and lives with their spouse Lives in: House/apartment Stairs: Yes: Internal: 12 steps; on left going up and External: 3 steps; bilateral but cannot reach both Has following equipment at home: None  OCCUPATION: Administrator, arts (works at a computer primarily)  PLOF: Independent and Leisure: playing golf, tennis, running.  However, has not been able to do secondary to pain.  PATIENT GOALS: Would like to be able to return to more active lifestyle of running and other desired sports.  NEXT MD VISIT:   OBJECTIVE:   DIAGNOSTIC FINDINGS:  Lumbar Radiograph on 06/06/2022: IMPRESSION: Degenerative changes. No acute osseous abnormalities.  Cervical Radiograph on 06/06/2022: IMPRESSION: Degenerative changes. No acute osseous abnormalities.  PATIENT SURVEYS:  Eval:  FOTO 56% (projected 62% by visit 10)     SENSATION: Reports occasionally has numbness and tingling in left hand  MUSCLE LENGTH: Hamstrings: slight tightness, but WFL  POSTURE: rounded shoulders and forward head  PALPATION: Pt with some muscle tightness noted in lumbar paraspinals.   Pt with reported SI joint pain.  LUMBAR ROM:   WFL with increased pain.  Reports that his neck "locks up" sometimes when he looks down.  LOWER EXTREMITY ROM:     WFL  UPPER/LOWER EXTREMITY MMT:    07/14/2022: UE is grossly WFL BLE strength is grossly at least 5-/5, except hip IR/ER are 4/5 bilat  LUMBAR SPECIAL TESTS:  Eval:  Slump test: Positive on left  FUNCTIONAL TESTS:  Eval: 5 times sit to stand: 9.07 sec Single Leg Stance:  Right- 7.87 sec, Left- 16.94 sec   GAIT: Distance walked: >500 ft Assistive device utilized: None Level of assistance: Complete Independence Comments: Patient does not report pain with walking, only jogging/running  TODAY'S TREATMENT:                                                                                                                              DATE: 08/21/2022   Pt seen for aquatic therapy today.  Treatment took place in water 3.5-4.75 ft in depth at the [facility] pool. Temp of water was 91.  Pt entered/exited the pool via stairs using step to pattern with hand rail.  * in 4+ ft of water, without support:  walking forward/backward with cues for reciprocal arm swing and vertical trunk * b KTC stretch with hands on rails and feet in 1st/2nd ladder hole * L stretch, cues for technique; Added tail wagging x 3 * farmer carry with bilat/single yellow hand floats under water *TrA sets using solid then big yellow noodle wide stance then staggered moving from 4.63ft to 4.3 x10 ea;  *Noodle  kick downs with solid noodle hip neutral then externally rotated 10 slow/10 quick uw support yellow HB *core engagement: seated on yellow noodle ue lifts unilaterally then bilaterally; ->pushing noodle down with knees to chest. *adductor set using BB 5 *STS from 3rd step x 5 with add set.  Pt requires the buoyancy and hydrostatic pressure of water for support, and to offload joints by unweighting joint load by at least 50 % in navel deep water and  by at least 75-80% in chest to neck deep water.  Viscosity of the water is needed for resistance of strengthening. Water current perturbations provides challenge to standing balance requiring increased core activation.  DATE: 08/18/22 Recumbent bike L4 x 5 min for warm up Seated pball flexion 3x10 sec, L & R lateral flexion 3x10 sec Seated figure 4 stretch 2 x 30 sec Child's pose 2x20  Bird dog 2x10 Prone shoulder ext 2x10 Prone "W" x10 Prone "T" x10 Prone "Y" x5 with manual assist Manual therapy Skilled assessment and palpation for TPDN Trigger Point Dry-Needling  Treatment instructions: Expect mild to moderate muscle soreness. S/S of pneumothorax if dry needled over a lung field, and to seek immediate medical attention should they occur. Patient verbalized understanding of these instructions and education. Patient Consent Given: Yes Education handout provided: Yes Muscles treated: bilateral lumbar multifidi, QL Electrical stimulation performed: No Parameters: N/A Treatment response/outcome: Utilized skilled palpation to identify bony landmarks and trigger points.  Able to illicit twitch response and muscle elongation.  Soft tissue mobilization following to further promote tissue elongation.  STM & TPR bilat lumbar paraspinal and QL   DATE: 08/14/2022   Pt seen for aquatic therapy today.  Treatment took place in water 3.5-4.75 ft in depth at the [facility] pool. Temp of water was 91.  Pt entered/exited the pool via stairs using step to pattern with hand rail.  * in 4+ ft of water, without support:  walking forward/backward with cues for reciprocal arm swing and vertical trunk * b KTC stretch with hands on rails and feet in 1st/2nd ladder hole * L stretch, cues for technique; Added tail wagging x 3 * farmer carry with bilat/single yellow hand floats under water *TrA sets using solid then big yellow noodle wide stance then staggered moving from 4.30ft to 4.3 x10 ea *Noodle kick  downs with solid noodle hip neutral then externally rotated 10 slow/10 quick uw support yellow HB *core engagement: standing on noodle: translating over noodle balancing with heels touching then toes; balancing directly on noodle ue initially on wall then supported with HB then without. Completed in 4.2 ft (good challenge)   Pt requires the buoyancy and hydrostatic pressure of water for support, and to offload joints by unweighting joint load by at least 50 % in navel deep water and by at least 75-80% in chest to neck deep water.  Viscosity of the water is needed for resistance of strengthening. Water current perturbations provides challenge to standing balance requiring increased core activation.    DATE: 08/11/2022  Nustep level 5 x6 min with PT present to discuss status Standing lower trap lift off 2x10 Seated cervical retraction isometric with ball behind head 2x10 Seated cervical SNAGs with towel x10 Standing hamstring stretch at stairs 2x20 sec bilat Standing scapular retractions 2x10 Seated thoracic extension 2x 30 sec hold (stopped due to pain) Quadruped cat and cows 2 reps Cervical Traction:  Intermittent x13 min with 15# max and 5# min. Trigger Point Dry-Needling  Treatment instructions: Expect mild  to moderate muscle soreness. S/S of pneumothorax if dry needled over a lung field, and to seek immediate medical attention should they occur. Patient verbalized understanding of these instructions and education. Patient Consent Given: Yes Education handout provided: Yes Muscles treated: bilateral lumbar multifidi Electrical stimulation performed: No Parameters: N/A Treatment response/outcome: Utilized skilled palpation to identify bony landmarks and trigger points.  Able to illicit twitch response and muscle elongation.  Soft tissue mobilization following to further promote tissue elongation.    DATE: 08/04/2022  * intro to aquatic therapy principles * in 4+ ft of water, without  support:  walking forward/backward with cues for reciprocal arm swing and vertical trunk * side stepping with arm addct/abdct, repeated with yellow hand floats (Rt LB discomfort with R side stepping with float) * farmer carry with bilat/single yellow hand floats under water * UE on yellow float with SLS, leg swings into hip flex/ext and hip abdct/add * seated balance on noodle in deeper water with single hand raise * straddling noodle and cycling with breast stroke arms * b KTC stretch with hands on rails and feet in 1st/2nd ladder hole * L stretch, cues for technique * hip hinge with hip bumps to wall with forward arm reach x 8 * single leg forward reach into superman arms  x 8 each side    PATIENT EDUCATION:  Education details: Issued HEP Person educated: Patient Education method: Explanation, Demonstration, and Handouts Education comprehension: verbalized understanding  HOME EXERCISE PROGRAM: Access Code: [email or web address] URL: [email or web address] Date: 07/14/2022 Prepared by: Clydie Braun Menke  Exercises - Seated Scapular Retraction  - 1 x daily - 7 x weekly - 2 sets - 10 reps - Seated Cervical Retraction  - 1 x daily - 7 x weekly - 2 sets - 10 reps - Pelvic Circles on Swiss Ball  - 1 x daily - 7 x weekly - 2 sets - 10 reps - Swiss Ball March  - 1 x daily - 7 x weekly - 2 sets - 10 reps - Swiss Ball Knee Extension  - 1 x daily - 7 x weekly - 2 sets - 10 reps   Aquatics Access Code: [email or web address] URL: [email or web address] Date: 08/21/2022 Prepared by: Geni Bers *This aquatic home exercise program from MedBridge utilizes pictures from land based exercises, but has been adapted prior to lamination and issuance.    Exercises - Standing 'L' Stretch at [employer]  - 1 x daily - 7 x weekly - 3 sets - 10 reps - Hand Buoy Carry  - 1 x daily - 7 x weekly - 3 sets - 10 reps - Side lunge with hand buoys  - 1 x daily - 7 x weekly - 3 sets - 10 reps - Sit to Stand   - 1 x daily - 7 x weekly - 3 sets - 10 reps - Standing Hip Hinge  - 1 x daily - 7 x weekly - 3 sets - 10 reps - Noodle Press  - 1 x daily - 7 x weekly - 3 sets - 10 reps - Seated Straddle on Flotation Forward Breast Stroke Arms and Bicycle Legs  - 1 x daily - 7 x weekly - 3 sets - 10 reps - Noodle Stomp  - 1 x daily - 7 x weekly - 3 sets - 10 reps - Sitting Balance on Pool Noodle  - 1 x daily - 7 x weekly - 3 sets - 10 reps - Standing Balance on Noodle at [employer]  -  1 x daily - 7 x weekly - 3 sets - 10 reps  ASSESSMENT:  CLINICAL IMPRESSION: Initiated HEP. Pt being instructed on various core strengthening exercises, extra focus on posterior core, adding to HEP with intention of varying exercises to allow for options to maintain compliance/interest. He is considering gaining access to a pool. He reports excellent toleration to aquatic intervention.  No pain reported in LB or cervical spine today, just stiffness.  He execute exercises well given VC and demonstration.  Instruction also on modifying exercises to increase/decrease challenge as to toleration. Next visit last aquatic session.  Will issue and instruct on final laminated HEP.    GOALS: Goals reviewed with patient? Yes  SHORT TERM GOALS: Target date: 08/02/2022  Patient will be independent with initial HEP. Baseline: Goal status: MET  2.  Patient will report at least a 30% improvement in symptoms. Baseline:  Goal status: IN PROGRESS   LONG TERM GOALS: Target date: 09/08/2022  Patient will be independent with advanced HEP. Baseline:  Goal status: INITIAL  2.  Patient will increase FOTO to at least 62% to demonstrate improvements in functional mobility. Baseline: 56% Goal status: INITIAL  3.  Patient will report ability to return to occasional jogging and/or playing golf without increased pain. Baseline:  Goal status: INITIAL  4.  Patient will report ability to wash his car with pain no greater than 3/10. Baseline:   Goal status: INITIAL  5.  Patient will increase single leg stance time to at least 30 seconds to increase his stability/decrease risk of falling. Baseline: Right- 7.87 sec, Left- 16.94 sec Goal status: INITIAL   PLAN:  PT FREQUENCY: 2x/week  PT DURATION: 8 weeks  PLANNED INTERVENTIONS: Therapeutic exercises, Therapeutic activity, Neuromuscular re-education, Balance training, Gait training, Patient/Family education, Self Care, Joint mobilization, Joint manipulation, Stair training, Aquatic Therapy, Dry Needling, Electrical stimulation, Spinal manipulation, Spinal mobilization, Cryotherapy, Moist heat, Taping, Vasopneumatic device, Traction, Ultrasound, Ionotophoresis 4mg /ml Dexamethasone, Manual therapy, and Re-evaluation.  PLAN FOR NEXT SESSION: Assess and progress HEP as indicated, Follow up with traction and dry needling/manual therapy, core stability, balance, aquatic PT   Elisabetta Mishra (Frankie) Jacquelynne Guedes MPT 08/21/22, 1:08 PM

## 2022-08-25 ENCOUNTER — Encounter: Payer: Self-pay | Admitting: Rehabilitative and Restorative Service Providers"

## 2022-08-25 ENCOUNTER — Ambulatory Visit: Payer: 59 | Admitting: Rehabilitative and Restorative Service Providers"

## 2022-08-25 DIAGNOSIS — M6281 Muscle weakness (generalized): Secondary | ICD-10-CM

## 2022-08-25 DIAGNOSIS — M542 Cervicalgia: Secondary | ICD-10-CM

## 2022-08-25 DIAGNOSIS — R2689 Other abnormalities of gait and mobility: Secondary | ICD-10-CM

## 2022-08-25 DIAGNOSIS — M5459 Other low back pain: Secondary | ICD-10-CM | POA: Diagnosis not present

## 2022-08-25 DIAGNOSIS — R252 Cramp and spasm: Secondary | ICD-10-CM

## 2022-08-25 NOTE — Therapy (Signed)
OUTPATIENT PHYSICAL THERAPY TREATMENT NOTE   Patient Name: Timothy Leblanc MRN: [id number] DOB:[birth year], 62 y.o., male Today's Date: 08/25/2022  END OF SESSION:  PT End of Session - 08/25/22 0802     Visit Number 8    Date for PT Re-Evaluation 09/08/22    Authorization Type [employer]    PT Start Time 0800    PT Stop Time 0840    PT Time Calculation (min) 40 min    Activity Tolerance Patient tolerated treatment well    Behavior During Therapy [facility] for tasks assessed/performed              Past Medical History:  Diagnosis Date   Diverticulitis 2016   Past Surgical History:  Procedure Laterality Date   COLONOSCOPY  2015   HYDROCELE EXCISION Right 09/08/2019   Procedure: HYDROCELE REPAIR;  Surgeon: Heloise Purpura, MD;  Location: [facility];  Service: Urology;  Laterality: Right;   IR 3D INDEPENDENT WKST  07/01/2021   IR ANGIO INTRA EXTRACRAN SEL COM CAROTID INNOMINATE UNI L MOD SED  07/01/2021   IR ANGIO INTRA EXTRACRAN SEL INTERNAL CAROTID UNI R MOD SED  07/01/2021   IR ANGIO VERTEBRAL SEL SUBCLAVIAN INNOMINATE UNI L MOD SED  07/01/2021   IR ANGIO VERTEBRAL SEL VERTEBRAL UNI R MOD SED  07/01/2021   IR RADIOLOGIST EVAL & MGMT  10/04/2021   IR US GUIDE VASC ACCESS RIGHT  07/01/2021   VASECTOMY  1996   Patient Active Problem List   Diagnosis Date Noted   Degenerative disc disease, lumbar 06/06/2022   Degenerative cervical disc 06/06/2022   Interstitial pulmonary disease (HCC) 09/07/2021   Stroke (HCC) 06/30/2021   Stroke (cerebrum) (HCC) 06/30/2021   Acute ischemic stroke (HCC) 06/30/2021    PCP: Ozella Rocks, MD  REFERRING PROVIDER: Judi Saa, DO  REFERRING DIAG: M51.36 (ICD-10-CM) - Degenerative disc disease, lumbar  Rationale for Evaluation and Treatment: Rehabilitation  THERAPY DIAG:  Other low back pain  Cervicalgia  Cramp and spasm  Muscle weakness (generalized)  Balance problem  ONSET DATE: at least 5 years, but has gotten worse in the past 2  years  SUBJECTIVE:                                                                                                                                                                                           SUBJECTIVE STATEMENT: Pt reports that he is feeling approximately 60-70% better since starting PT.  Pt reported that he was able to wash his car with less pain than the previous time that he washed.   PERTINENT HISTORY:  OA, Degenerative Disc Disease,  CVA in 2023  PAIN:  Are you having pain? Yes: NPRS scale: cervical 0/10 and back 1/10 Pain location: Primarily low back, occasionally cervical Pain description: varies between sharp to dull ache Aggravating factors: washing car, yard work Relieving factors: medication, rest  PRECAUTIONS: None  WEIGHT BEARING RESTRICTIONS: No  FALLS:  Has patient fallen in last 6 months? Yes. Number of falls 1 fall when working on his deck and walking across beams  LIVING ENVIRONMENT: Lives with: lives with their family and lives with their spouse Lives in: House/apartment Stairs: Yes: Internal: 12 steps; on left going up and External: 3 steps; bilateral but cannot reach both Has following equipment at home: None  OCCUPATION: Administrator, arts (works at a computer primarily)  PLOF: Independent and Leisure: playing golf, tennis, running.  However, has not been able to do secondary to pain.  PATIENT GOALS: Would like to be able to return to more active lifestyle of running and other desired sports.  NEXT MD VISIT:   OBJECTIVE:   DIAGNOSTIC FINDINGS:  Lumbar Radiograph on 06/06/2022: IMPRESSION: Degenerative changes. No acute osseous abnormalities.  Cervical Radiograph on 06/06/2022: IMPRESSION: Degenerative changes. No acute osseous abnormalities.  PATIENT SURVEYS:  Eval:  FOTO 56% (projected 62% by visit 10)     SENSATION: Reports occasionally has numbness and tingling in left hand  MUSCLE LENGTH: Hamstrings: slight  tightness, but WFL  POSTURE: rounded shoulders and forward head  PALPATION: Pt with some muscle tightness noted in lumbar paraspinals.  Pt with reported SI joint pain.  LUMBAR ROM:   WFL with increased pain.  Reports that his neck "locks up" sometimes when he looks down.  LOWER EXTREMITY ROM:     WFL  UPPER/LOWER EXTREMITY MMT:    07/14/2022: UE is grossly WFL BLE strength is grossly at least 5-/5, except hip IR/ER are 4/5 bilat  LUMBAR SPECIAL TESTS:  Eval:  Slump test: Positive on left  FUNCTIONAL TESTS:  Eval: 5 times sit to stand: 9.07 sec Single Leg Stance:  Right- 7.87 sec, Left- 16.94 sec   GAIT: Distance walked: >500 ft Assistive device utilized: None Level of assistance: Complete Independence Comments: Patient does not report pain with walking, only jogging/running  TODAY'S TREATMENT:                                                                                                                               DATE: 08/25/2022  Recumbent bike L4 x 6 min for warm up Seated pball flexion 3x10 sec, L & R lateral flexion 3x10 sec Seated figure 4 stretch 2 x 30 sec Quadruped pelvic tilts 1 x 8  Leg press seat #8 115 lbs 2 x 10 reps Wall squats 3 x 15 sec  Standing bosu taps in mini squat position 3x 25 taps  Prone Ys with 1 x 10 with 2 sec hold  Prone Ts 1 x 10 with 2 sec hold Prone Is 1 x 10 with  2 sec hold  Bear holds 3 x 15 sec hold  Childs pose 2 x 20 sec  Seated figure 4 stretch 2 x 30 sec Seated hamstring stretch 2 x 30 sec  Gastroc rocker for 1 min  Standing hamstring stretch on stairs 2 x 20 sec  Eccentric calf raises 1 x 8   DATE: 08/21/2022   Pt seen for aquatic therapy today.  Treatment took place in water 3.5-4.75 ft in depth at the [facility] pool. Temp of water was 91.  Pt entered/exited the pool via stairs using step to pattern with hand rail.  * in 4+ ft of water, without support:  walking forward/backward with cues for reciprocal  arm swing and vertical trunk * b KTC stretch with hands on rails and feet in 1st/2nd ladder hole * L stretch, cues for technique; Added tail wagging x 3 * farmer carry with bilat/single yellow hand floats under water *TrA sets using solid then big yellow noodle wide stance then staggered moving from 4.67ft to 4.3 x10 ea;  *Noodle kick downs with solid noodle hip neutral then externally rotated 10 slow/10 quick uw support yellow HB *core engagement: seated on yellow noodle ue lifts unilaterally then bilaterally; ->pushing noodle down with knees to chest. *adductor set using BB 5 *STS from 3rd step x 5 with add set.  Pt requires the buoyancy and hydrostatic pressure of water for support, and to offload joints by unweighting joint load by at least 50 % in navel deep water and by at least 75-80% in chest to neck deep water.  Viscosity of the water is needed for resistance of strengthening. Water current perturbations provides challenge to standing balance requiring increased core activation.  DATE: 08/18/22 Recumbent bike L4 x 5 min for warm up Seated pball flexion 3x10 sec, L & R lateral flexion 3x10 sec Seated figure 4 stretch 2 x 30 sec Child's pose 2x20  Bird dog 2x10 Prone shoulder ext 2x10 Prone "W" x10 Prone "T" x10 Prone "Y" x5 with manual assist Manual therapy Skilled assessment and palpation for TPDN Trigger Point Dry-Needling  Treatment instructions: Expect mild to moderate muscle soreness. S/S of pneumothorax if dry needled over a lung field, and to seek immediate medical attention should they occur. Patient verbalized understanding of these instructions and education. Patient Consent Given: Yes Education handout provided: Yes Muscles treated: bilateral lumbar multifidi, QL Electrical stimulation performed: No Parameters: N/A Treatment response/outcome: Utilized skilled palpation to identify bony landmarks and trigger points.  Able to illicit twitch response and muscle elongation.   Soft tissue mobilization following to further promote tissue elongation.  STM & TPR bilat lumbar paraspinal and QL    PATIENT EDUCATION:  Education details: Issued HEP Person educated: Patient Education method: Explanation, Demonstration, and Handouts Education comprehension: verbalized understanding  HOME EXERCISE PROGRAM: Access Code: [email or web address] URL: [email or web address] Date: 07/14/2022 Prepared by: Reather Laurence  Exercises - Seated Scapular Retraction  - 1 x daily - 7 x weekly - 2 sets - 10 reps - Seated Cervical Retraction  - 1 x daily - 7 x weekly - 2 sets - 10 reps - Pelvic Circles on Swiss Ball  - 1 x daily - 7 x weekly - 2 sets - 10 reps - Swiss Ball March  - 1 x daily - 7 x weekly - 2 sets - 10 reps - Swiss Ball Knee Extension  - 1 x daily - 7 x weekly - 2 sets - 10 reps   Aquatics Access Code: [email or web address]  URL: [email or web address] Date: 08/21/2022 Prepared by: Geni Bers *This aquatic home exercise program from MedBridge utilizes pictures from land based exercises, but has been adapted prior to lamination and issuance.    Exercises - Standing 'L' Stretch at [employer]  - 1 x daily - 7 x weekly - 3 sets - 10 reps - Hand Buoy Carry  - 1 x daily - 7 x weekly - 3 sets - 10 reps - Side lunge with hand buoys  - 1 x daily - 7 x weekly - 3 sets - 10 reps - Sit to Stand  - 1 x daily - 7 x weekly - 3 sets - 10 reps - Standing Hip Hinge  - 1 x daily - 7 x weekly - 3 sets - 10 reps - Noodle Press  - 1 x daily - 7 x weekly - 3 sets - 10 reps - Seated Straddle on Flotation Forward Breast Stroke Arms and Bicycle Legs  - 1 x daily - 7 x weekly - 3 sets - 10 reps - Noodle Stomp  - 1 x daily - 7 x weekly - 3 sets - 10 reps - Sitting Balance on Pool Noodle  - 1 x daily - 7 x weekly - 3 sets - 10 reps - Standing Balance on Noodle at [employer]  - 1 x daily - 7 x weekly - 3 sets - 10 reps  ASSESSMENT:  CLINICAL IMPRESSION: Patient presented to clinic  with 0/10 neck pain and 1/10 low back pain at the beginning of the session and 0/10 pain for both areas at the end of the session. Patient tolerated most interventions well with no reproduction of concordant pain just moderate muscle burn limited progressions in reps. Patient reported that the stretching focused on the hamstrings, gastroc, and hip external rotators were very helpful in reducing pain and sensation of "tightness". Patient was educated on the contribution neural tension along the posterior chain can refer pain into the low back. Patient continues to demonstrate the need for therapeutic interventions to address muscular endurance, strength, and tissue extensibility contributing to limitations in low back and neck.      GOALS: Goals reviewed with patient? Yes  SHORT TERM GOALS: Target date: 08/02/2022  Patient will be independent with initial HEP. Baseline: Goal status: MET  2.  Patient will report at least a 30% improvement in symptoms. Baseline:  Goal status: MET   LONG TERM GOALS: Target date: 09/08/2022  Patient will be independent with advanced HEP. Baseline:  Goal status: IN PROGRESS  2.  Patient will increase FOTO to at least 62% to demonstrate improvements in functional mobility. Baseline: 56% Goal status: INITIAL  3.  Patient will report ability to return to occasional jogging and/or playing golf without increased pain. Baseline:  Goal status: INITIAL  4.  Patient will report ability to wash his car with pain no greater than 3/10. Baseline:  Goal status: IN PROGRESS  5.  Patient will increase single leg stance time to at least 30 seconds to increase his stability/decrease risk of falling. Baseline: Right- 7.87 sec, Left- 16.94 sec Goal status: INITIAL   PLAN:  PT FREQUENCY: 2x/week  PT DURATION: 8 weeks  PLANNED INTERVENTIONS: Therapeutic exercises, Therapeutic activity, Neuromuscular re-education, Balance training, Gait training, Patient/Family  education, Self Care, Joint mobilization, Joint manipulation, Stair training, Aquatic Therapy, Dry Needling, Electrical stimulation, Spinal manipulation, Spinal mobilization, Cryotherapy, Moist heat, Taping, Vasopneumatic device, Traction, Ultrasound, Ionotophoresis 4mg /ml Dexamethasone, Manual therapy, and Re-evaluation.  PLAN FOR NEXT  SESSION: Assess and progress HEP as indicated, Follow up with traction and dry needling/manual therapy, core stability, balance, aquatic PT   Meghan Crowfoot, SPT Reather Laurence, PT, DPT 08/25/22, 9:04 AM  [facility] [address], [address] [place], [place] [address] Phone # [phone] Fax [phone]

## 2022-08-28 ENCOUNTER — Encounter (HOSPITAL_BASED_OUTPATIENT_CLINIC_OR_DEPARTMENT_OTHER): Payer: Self-pay | Admitting: Physical Therapy

## 2022-08-28 ENCOUNTER — Ambulatory Visit (HOSPITAL_BASED_OUTPATIENT_CLINIC_OR_DEPARTMENT_OTHER): Payer: 59 | Admitting: Physical Therapy

## 2022-08-28 ENCOUNTER — Encounter: Payer: Self-pay | Admitting: Family Medicine

## 2022-08-28 DIAGNOSIS — M542 Cervicalgia: Secondary | ICD-10-CM

## 2022-08-28 DIAGNOSIS — M5459 Other low back pain: Secondary | ICD-10-CM | POA: Diagnosis not present

## 2022-08-28 DIAGNOSIS — R252 Cramp and spasm: Secondary | ICD-10-CM

## 2022-08-28 NOTE — Therapy (Signed)
OUTPATIENT PHYSICAL THERAPY TREATMENT NOTE   Patient Name: Timothy Leblanc MRN: [id number] DOB:[birth year], 62 y.o., male Today's Date: 08/28/2022  END OF SESSION:  PT End of Session - 08/28/22 0815     Visit Number 9    Date for PT Re-Evaluation 09/08/22    Authorization Type [employer]    PT Start Time 0815    PT Stop Time 0900    PT Time Calculation (min) 45 min    Activity Tolerance Patient tolerated treatment well    Behavior During Therapy [facility] for tasks assessed/performed              Past Medical History:  Diagnosis Date   Diverticulitis 2016   Past Surgical History:  Procedure Laterality Date   COLONOSCOPY  2015   HYDROCELE EXCISION Right 09/08/2019   Procedure: HYDROCELE REPAIR;  Surgeon: Heloise Purpura, MD;  Location: [facility];  Service: Urology;  Laterality: Right;   IR 3D INDEPENDENT WKST  07/01/2021   IR ANGIO INTRA EXTRACRAN SEL COM CAROTID INNOMINATE UNI L MOD SED  07/01/2021   IR ANGIO INTRA EXTRACRAN SEL INTERNAL CAROTID UNI R MOD SED  07/01/2021   IR ANGIO VERTEBRAL SEL SUBCLAVIAN INNOMINATE UNI L MOD SED  07/01/2021   IR ANGIO VERTEBRAL SEL VERTEBRAL UNI R MOD SED  07/01/2021   IR RADIOLOGIST EVAL & MGMT  10/04/2021   IR US GUIDE VASC ACCESS RIGHT  07/01/2021   VASECTOMY  1996   Patient Active Problem List   Diagnosis Date Noted   Degenerative disc disease, lumbar 06/06/2022   Degenerative cervical disc 06/06/2022   Interstitial pulmonary disease (HCC) 09/07/2021   Stroke (HCC) 06/30/2021   Stroke (cerebrum) (HCC) 06/30/2021   Acute ischemic stroke (HCC) 06/30/2021    PCP: Ozella Rocks, MD  REFERRING PROVIDER: Judi Saa, DO  REFERRING DIAG: M51.36 (ICD-10-CM) - Degenerative disc disease, lumbar  Rationale for Evaluation and Treatment: Rehabilitation  THERAPY DIAG:  Other low back pain  Cervicalgia  Cramp and spasm  ONSET DATE: at least 5 years, but has gotten worse in the past 2 years  SUBJECTIVE:                                                                                                                                                                                            SUBJECTIVE STATEMENT: Pt reports increase in pain after last land based session. States he had to start back on his oral meds due to discomfort.  Feels the Cat/Cow is what triggered.  Brought wife today so she could observe and then assist with Pts' HEP. They also have tour later  today for possible membership here at [facility].   PERTINENT HISTORY:  OA, Degenerative Disc Disease, CVA in 2023  PAIN:  Are you having pain? Yes: NPRS scale: cervical 0/10 and back 1/10 Pain location: Primarily low back, occasionally cervical Pain description: varies between sharp to dull ache Aggravating factors: washing car, yard work Relieving factors: medication, rest  PRECAUTIONS: None  WEIGHT BEARING RESTRICTIONS: No  FALLS:  Has patient fallen in last 6 months? Yes. Number of falls 1 fall when working on his deck and walking across beams  LIVING ENVIRONMENT: Lives with: lives with their family and lives with their spouse Lives in: House/apartment Stairs: Yes: Internal: 12 steps; on left going up and External: 3 steps; bilateral but cannot reach both Has following equipment at home: None  OCCUPATION: Administrator, arts (works at a computer primarily)  PLOF: Independent and Leisure: playing golf, tennis, running.  However, has not been able to do secondary to pain.  PATIENT GOALS: Would like to be able to return to more active lifestyle of running and other desired sports.  NEXT MD VISIT:   OBJECTIVE:   DIAGNOSTIC FINDINGS:  Lumbar Radiograph on 06/06/2022: IMPRESSION: Degenerative changes. No acute osseous abnormalities.  Cervical Radiograph on 06/06/2022: IMPRESSION: Degenerative changes. No acute osseous abnormalities.  PATIENT SURVEYS:  Eval:  FOTO 56% (projected 62% by visit 10)     SENSATION: Reports occasionally has  numbness and tingling in left hand  MUSCLE LENGTH: Hamstrings: slight tightness, but WFL  POSTURE: rounded shoulders and forward head  PALPATION: Pt with some muscle tightness noted in lumbar paraspinals.  Pt with reported SI joint pain.  LUMBAR ROM:   WFL with increased pain.  Reports that his neck "locks up" sometimes when he looks down.  LOWER EXTREMITY ROM:     WFL  UPPER/LOWER EXTREMITY MMT:    07/14/2022: UE is grossly WFL BLE strength is grossly at least 5-/5, except hip IR/ER are 4/5 bilat  LUMBAR SPECIAL TESTS:  Eval:  Slump test: Positive on left  FUNCTIONAL TESTS:  Eval: 5 times sit to stand: 9.07 sec Single Leg Stance:  Right- 7.87 sec, Left- 16.94 sec   GAIT: Distance walked: >500 ft Assistive device utilized: None Level of assistance: Complete Independence Comments: Patient does not report pain with walking, only jogging/running  TODAY'S TREATMENT:                                                                                                                              08/28/22  Pt seen for aquatic therapy today.  Treatment took place in water 3.5-4.75 ft in depth at the [facility] pool. Temp of water was 91.  Pt entered/exited the pool via stairs using step to pattern with hand rail.  * in 4+ ft of water, without support:  walking forward/backward with cues for reciprocal arm swing and vertical trunk * b KTC stretch with hands on rails and feet in 1st/2nd ladder hole *  L stretch, cues for technique; Added tail wagging x 3 - Sit to Stand   - Standing Hip Hinge   - Noodle Press   - Noodle Stomp      Pt requires the buoyancy and hydrostatic pressure of water for support, and to offload joints by unweighting joint load by at least 50 % in navel deep water and by at least 75-80% in chest to neck deep water.  Viscosity of the water is needed for resistance of strengthening. Water current perturbations provides challenge to standing balance  requiring increased core activation.    DATE: 08/25/2022  Recumbent bike L4 x 6 min for warm up Seated pball flexion 3x10 sec, L & R lateral flexion 3x10 sec Seated figure 4 stretch 2 x 30 sec Quadruped pelvic tilts 1 x 8  Leg press seat #8 115 lbs 2 x 10 reps Wall squats 3 x 15 sec  Standing bosu taps in mini squat position 3x 25 taps  Prone Ys with 1 x 10 with 2 sec hold  Prone Ts 1 x 10 with 2 sec hold Prone Is 1 x 10 with 2 sec hold  Bear holds 3 x 15 sec hold  Childs pose 2 x 20 sec  Seated figure 4 stretch 2 x 30 sec Seated hamstring stretch 2 x 30 sec  Gastroc rocker for 1 min  Standing hamstring stretch on stairs 2 x 20 sec  Eccentric calf raises 1 x 8   DATE: 08/21/2022   Pt seen for aquatic therapy today.  Treatment took place in water 3.5-4.75 ft in depth at the [facility] pool. Temp of water was 91.  Pt entered/exited the pool via stairs using step to pattern with hand rail.  * in 4+ ft of water, without support:  walking forward/backward with cues for reciprocal arm swing and vertical trunk * b KTC stretch with hands on rails and feet in 1st/2nd ladder hole * L stretch, cues for technique; Added tail wagging x 3 * farmer carry with bilat/single yellow hand floats under water *TrA sets using solid then big yellow noodle wide stance then staggered moving from 4.35ft to 4.3 x10 ea;  *Noodle kick downs with solid noodle hip neutral then externally rotated 10 slow/10 quick uw support yellow HB *core engagement: seated on yellow noodle ue lifts unilaterally then bilaterally; ->pushing noodle down with knees to chest. *adductor set using BB 5 *STS from 3rd step x 5 with add set.  Pt requires the buoyancy and hydrostatic pressure of water for support, and to offload joints by unweighting joint load by at least 50 % in navel deep water and by at least 75-80% in chest to neck deep water.  Viscosity of the water is needed for resistance of strengthening. Water current  perturbations provides challenge to standing balance requiring increased core activation.  DATE: 08/18/22 Recumbent bike L4 x 5 min for warm up Seated pball flexion 3x10 sec, L & R lateral flexion 3x10 sec Seated figure 4 stretch 2 x 30 sec Child's pose 2x20  Bird dog 2x10 Prone shoulder ext 2x10 Prone "W" x10 Prone "T" x10 Prone "Y" x5 with manual assist Manual therapy Skilled assessment and palpation for TPDN Trigger Point Dry-Needling  Treatment instructions: Expect mild to moderate muscle soreness. S/S of pneumothorax if dry needled over a lung field, and to seek immediate medical attention should they occur. Patient verbalized understanding of these instructions and education. Patient Consent Given: Yes Education handout provided: Yes Muscles treated: bilateral  lumbar multifidi, QL Electrical stimulation performed: No Parameters: N/A Treatment response/outcome: Utilized skilled palpation to identify bony landmarks and trigger points.  Able to illicit twitch response and muscle elongation.  Soft tissue mobilization following to further promote tissue elongation.  STM & TPR bilat lumbar paraspinal and QL    PATIENT EDUCATION:  Education details: Issued HEP Person educated: Patient Education method: Explanation, Demonstration, and Handouts Education comprehension: verbalized understanding  HOME EXERCISE PROGRAM: Access Code: [email or web address] URL: [email or web address] Date: 07/14/2022 Prepared by: Clydie Braun Menke  Exercises - Seated Scapular Retraction  - 1 x daily - 7 x weekly - 2 sets - 10 reps - Seated Cervical Retraction  - 1 x daily - 7 x weekly - 2 sets - 10 reps - Pelvic Circles on Swiss Ball  - 1 x daily - 7 x weekly - 2 sets - 10 reps - Swiss Ball March  - 1 x daily - 7 x weekly - 2 sets - 10 reps - Swiss Ball Knee Extension  - 1 x daily - 7 x weekly - 2 sets - 10 reps   Aquatics Access Code: [email or web address] URL: [email or web address] Date:  08/21/2022 Prepared by: Geni Bers *This aquatic home exercise program from MedBridge utilizes pictures from land based exercises, but has been adapted prior to lamination and issuance.    Exercises - Standing 'L' Stretch at [employer]  - 1 x daily - 7 x weekly - 3 sets - 10 reps - Hand Buoy Carry  - 1 x daily - 7 x weekly - 3 sets - 10 reps - Side lunge with hand buoys  - 1 x daily - 7 x weekly - 3 sets - 10 reps - Sit to Stand  - 1 x daily - 7 x weekly - 3 sets - 10 reps - Standing Hip Hinge  - 1 x daily - 7 x weekly - 3 sets - 10 reps - Noodle Press  - 1 x daily - 7 x weekly - 3 sets - 10 reps - Seated Straddle on Flotation Forward Breast Stroke Arms and Bicycle Legs  - 1 x daily - 7 x weekly - 3 sets - 10 reps - Noodle Stomp  - 1 x daily - 7 x weekly - 3 sets - 10 reps - Sitting Balance on Pool Noodle  - 1 x daily - 7 x weekly - 3 sets - 10 reps - Standing Balance on Noodle at [employer]  - 1 x daily - 7 x weekly - 3 sets - 10 reps  ASSESSMENT:  CLINICAL IMPRESSION Poor response to last session with significant increase in pain.  He presents today with guarded posture and limited in movement, feeling tight.  Wife present.  He improves as session progresses with stretching and core engagement. Cues for core stabilization throughout. Aquatic HEP created, laminated and issued.  He will benefit from added aquatic intervention for 1-2 more sessions to continue instruction and ensure indep on HEP. He is hesitant to return to land based intervention reporting he always feels better after aquatics and not always land. He does VU of importance of knowing land based exercises in event no pool access.  He VU.  He plans on pool access soon. Goals ongoing.     GOALS: Goals reviewed with patient? Yes  SHORT TERM GOALS: Target date: 08/02/2022  Patient will be independent with initial HEP. Baseline: Goal status: MET  2.  Patient will report at least a 30% improvement in symptoms. Baseline:  Goal status: MET   LONG TERM GOALS: Target date: 09/08/2022  Patient will be independent with advanced HEP. Baseline:  Goal status: IN PROGRESS  2.  Patient will increase FOTO to at least 62% to demonstrate improvements in functional mobility. Baseline: 56% Goal status: INITIAL  3.  Patient will report ability to return to occasional jogging and/or playing golf without increased pain. Baseline:  Goal status: INITIAL  4.  Patient will report ability to wash his car with pain no greater than 3/10. Baseline:  Goal status: IN PROGRESS  5.  Patient will increase single leg stance time to at least 30 seconds to increase his stability/decrease risk of falling. Baseline: Right- 7.87 sec, Left- 16.94 sec Goal status: INITIAL   PLAN:  PT FREQUENCY: 2x/week  PT DURATION: 8 weeks  PLANNED INTERVENTIONS: Therapeutic exercises, Therapeutic activity, Neuromuscular re-education, Balance training, Gait training, Patient/Family education, Self Care, Joint mobilization, Joint manipulation, Stair training, Aquatic Therapy, Dry Needling, Electrical stimulation, Spinal manipulation, Spinal mobilization, Cryotherapy, Moist heat, Taping, Vasopneumatic device, Traction, Ultrasound, Ionotophoresis 4mg /ml Dexamethasone, Manual therapy, and Re-evaluation.  PLAN FOR NEXT SESSION: Assess and progress HEP as indicated, Follow up with traction and dry needling/manual therapy, core stability, balance, aquatic PT   Rushie Chestnut) Kaheem Halleck MPT 08/28/22, 12:52 PM  [facility] [address], [address] [place], [place] [address] Phone # [phone] Fax [phone]

## 2022-08-30 ENCOUNTER — Encounter: Payer: Self-pay | Admitting: Family Medicine

## 2022-08-31 ENCOUNTER — Encounter: Payer: Self-pay | Admitting: Rehabilitative and Restorative Service Providers"

## 2022-08-31 ENCOUNTER — Encounter (HOSPITAL_BASED_OUTPATIENT_CLINIC_OR_DEPARTMENT_OTHER): Payer: Self-pay | Admitting: Physical Therapy

## 2022-08-31 ENCOUNTER — Ambulatory Visit (HOSPITAL_BASED_OUTPATIENT_CLINIC_OR_DEPARTMENT_OTHER): Payer: 59 | Admitting: Physical Therapy

## 2022-08-31 DIAGNOSIS — M5459 Other low back pain: Secondary | ICD-10-CM

## 2022-08-31 DIAGNOSIS — R252 Cramp and spasm: Secondary | ICD-10-CM

## 2022-08-31 DIAGNOSIS — M6281 Muscle weakness (generalized): Secondary | ICD-10-CM

## 2022-08-31 NOTE — Therapy (Signed)
OUTPATIENT PHYSICAL THERAPY TREATMENT NOTE   Patient Name: Timothy Leblanc MRN: [id number] DOB:1960/02/17, 62 y.o., male Today's Date: 08/31/2022  END OF SESSION:  PT End of Session - 08/31/22 1214     Visit Number 10    Date for PT Re-Evaluation 09/08/22    Authorization Type [employer]    PT Start Time 1205    PT Stop Time 1245    PT Time Calculation (min) 40 min    Activity Tolerance Patient tolerated treatment well    Behavior During Therapy [facility] for tasks assessed/performed               Past Medical History:  Diagnosis Date   Diverticulitis 2016   Past Surgical History:  Procedure Laterality Date   COLONOSCOPY  2015   HYDROCELE EXCISION Right 09/08/2019   Procedure: HYDROCELE REPAIR;  Surgeon: Heloise Purpura, MD;  Location: [facility];  Service: Urology;  Laterality: Right;   IR 3D INDEPENDENT WKST  07/01/2021   IR ANGIO INTRA EXTRACRAN SEL COM CAROTID INNOMINATE UNI L MOD SED  07/01/2021   IR ANGIO INTRA EXTRACRAN SEL INTERNAL CAROTID UNI R MOD SED  07/01/2021   IR ANGIO VERTEBRAL SEL SUBCLAVIAN INNOMINATE UNI L MOD SED  07/01/2021   IR ANGIO VERTEBRAL SEL VERTEBRAL UNI R MOD SED  07/01/2021   IR RADIOLOGIST EVAL & MGMT  10/04/2021   IR US GUIDE VASC ACCESS RIGHT  07/01/2021   VASECTOMY  1996   Patient Active Problem List   Diagnosis Date Noted   Degenerative disc disease, lumbar 06/06/2022   Degenerative cervical disc 06/06/2022   Interstitial pulmonary disease (HCC) 09/07/2021   Stroke (HCC) 06/30/2021   Stroke (cerebrum) (HCC) 06/30/2021   Acute ischemic stroke (HCC) 06/30/2021    PCP: Ozella Rocks, MD  REFERRING PROVIDER: Judi Saa, DO  REFERRING DIAG: M51.36 (ICD-10-CM) - Degenerative disc disease, lumbar  Rationale for Evaluation and Treatment: Rehabilitation  THERAPY DIAG:  Other low back pain  Cramp and spasm  Muscle weakness (generalized)  ONSET DATE: at least 5 years, but has gotten worse in the past 2 years  SUBJECTIVE:                                                                                                                                                                                            SUBJECTIVE STATEMENT: Pt reports reduction in pain since last session.  It having some difficulty with intestinal issues   PERTINENT HISTORY:  OA, Degenerative Disc Disease, CVA in 2023  PAIN:  Are you having pain? Yes: NPRS scale: cervical 0/10 and back 1/10 Pain location: Primarily  low back, occasionally cervical Pain description: varies between sharp to dull ache Aggravating factors: washing car, yard work Relieving factors: medication, rest  PRECAUTIONS: None  WEIGHT BEARING RESTRICTIONS: No  FALLS:  Has patient fallen in last 6 months? Yes. Number of falls 1 fall when working on his deck and walking across beams  LIVING ENVIRONMENT: Lives with: lives with their family and lives with their spouse Lives in: House/apartment Stairs: Yes: Internal: 12 steps; on left going up and External: 3 steps; bilateral but cannot reach both Has following equipment at home: None  OCCUPATION: Administrator, arts (works at a computer primarily)  PLOF: Independent and Leisure: playing golf, tennis, running.  However, has not been able to do secondary to pain.  PATIENT GOALS: Would like to be able to return to more active lifestyle of running and other desired sports.  NEXT MD VISIT:   OBJECTIVE:   DIAGNOSTIC FINDINGS:  Lumbar Radiograph on 06/06/2022: IMPRESSION: Degenerative changes. No acute osseous abnormalities.  Cervical Radiograph on 06/06/2022: IMPRESSION: Degenerative changes. No acute osseous abnormalities.  PATIENT SURVEYS:  Eval:  FOTO 56% (projected 62% by visit 10)     SENSATION: Reports occasionally has numbness and tingling in left hand  MUSCLE LENGTH: Hamstrings: slight tightness, but WFL  POSTURE: rounded shoulders and forward head  PALPATION: Pt with some muscle tightness noted in  lumbar paraspinals.  Pt with reported SI joint pain.  LUMBAR ROM:   WFL with increased pain.  Reports that his neck "locks up" sometimes when he looks down.  LOWER EXTREMITY ROM:     WFL  UPPER/LOWER EXTREMITY MMT:    07/14/2022: UE is grossly WFL BLE strength is grossly at least 5-/5, except hip IR/ER are 4/5 bilat  LUMBAR SPECIAL TESTS:  Eval:  Slump test: Positive on left  FUNCTIONAL TESTS:  Eval: 5 times sit to stand: 9.07 sec Single Leg Stance:  Right- 7.87 sec, Left- 16.94 sec   GAIT: Distance walked: >500 ft Assistive device utilized: None Level of assistance: Complete Independence Comments: Patient does not report pain with walking, only jogging/running  TODAY'S TREATMENT:                                                                                                                              08/31/22 Pt seen for aquatic therapy today.  Treatment took place in water 3.5-4.75 ft in depth at the [facility] pool. Temp of water was 91.  Pt entered/exited the pool via stairs using step to pattern with hand rail.  * in 4+ ft of water, without support:  walking forward/backward with cues for reciprocal arm swing and vertical trunk -HB carry - L stretch, cues for technique; Added tail wagging x 3 - Standing Hip Hinge   - Noodle Press   - Noodle Stomp   -side lunge with ue add/abd   Pt requires the buoyancy and hydrostatic pressure of water for support, and to offload joints by unweighting  joint load by at least 50 % in navel deep water and by at least 75-80% in chest to neck deep water.  Viscosity of the water is needed for resistance of strengthening. Water current perturbations provides challenge to standing balance requiring increased core activation.  08/28/22  Pt seen for aquatic therapy today.  Treatment took place in water 3.5-4.75 ft in depth at the [facility] pool. Temp of water was 91.  Pt entered/exited the pool via stairs using step to  pattern with hand rail.  * in 4+ ft of water, without support:  walking forward/backward with cues for reciprocal arm swing and vertical trunk * b KTC stretch with hands on rails and feet in 1st/2nd ladder hole * L stretch, cues for technique; Added tail wagging x 3 - Sit to Stand   - Standing Hip Hinge   - Noodle Press   - Noodle Stomp      Pt requires the buoyancy and hydrostatic pressure of water for support, and to offload joints by unweighting joint load by at least 50 % in navel deep water and by at least 75-80% in chest to neck deep water.  Viscosity of the water is needed for resistance of strengthening. Water current perturbations provides challenge to standing balance requiring increased core activation.    DATE: 08/25/2022  Recumbent bike L4 x 6 min for warm up Seated pball flexion 3x10 sec, L & R lateral flexion 3x10 sec Seated figure 4 stretch 2 x 30 sec Quadruped pelvic tilts 1 x 8  Leg press seat #8 115 lbs 2 x 10 reps Wall squats 3 x 15 sec  Standing bosu taps in mini squat position 3x 25 taps  Prone Ys with 1 x 10 with 2 sec hold  Prone Ts 1 x 10 with 2 sec hold Prone Is 1 x 10 with 2 sec hold  Bear holds 3 x 15 sec hold  Childs pose 2 x 20 sec  Seated figure 4 stretch 2 x 30 sec Seated hamstring stretch 2 x 30 sec  Gastroc rocker for 1 min  Standing hamstring stretch on stairs 2 x 20 sec  Eccentric calf raises 1 x 8   DATE: 08/21/2022   Pt seen for aquatic therapy today.  Treatment took place in water 3.5-4.75 ft in depth at the [facility] pool. Temp of water was 91.  Pt entered/exited the pool via stairs using step to pattern with hand rail.  * in 4+ ft of water, without support:  walking forward/backward with cues for reciprocal arm swing and vertical trunk * b KTC stretch with hands on rails and feet in 1st/2nd ladder hole * L stretch, cues for technique; Added tail wagging x 3 * farmer carry with bilat/single yellow hand floats under  water *TrA sets using solid then big yellow noodle wide stance then staggered moving from 4.22ft to 4.3 x10 ea;  *Noodle kick downs with solid noodle hip neutral then externally rotated 10 slow/10 quick uw support yellow HB *core engagement: seated on yellow noodle ue lifts unilaterally then bilaterally; ->pushing noodle down with knees to chest. *adductor set using BB 5 *STS from 3rd step x 5 with add set.  Pt requires the buoyancy and hydrostatic pressure of water for support, and to offload joints by unweighting joint load by at least 50 % in navel deep water and by at least 75-80% in chest to neck deep water.  Viscosity of the water is needed for resistance of strengthening. Water  current perturbations provides challenge to standing balance requiring increased core activation.  DATE: 08/18/22 Recumbent bike L4 x 5 min for warm up Seated pball flexion 3x10 sec, L & R lateral flexion 3x10 sec Seated figure 4 stretch 2 x 30 sec Child's pose 2x20  Bird dog 2x10 Prone shoulder ext 2x10 Prone "W" x10 Prone "T" x10 Prone "Y" x5 with manual assist Manual therapy Skilled assessment and palpation for TPDN Trigger Point Dry-Needling  Treatment instructions: Expect mild to moderate muscle soreness. S/S of pneumothorax if dry needled over a lung field, and to seek immediate medical attention should they occur. Patient verbalized understanding of these instructions and education. Patient Consent Given: Yes Education handout provided: Yes Muscles treated: bilateral lumbar multifidi, QL Electrical stimulation performed: No Parameters: N/A Treatment response/outcome: Utilized skilled palpation to identify bony landmarks and trigger points.  Able to illicit twitch response and muscle elongation.  Soft tissue mobilization following to further promote tissue elongation.  STM & TPR bilat lumbar paraspinal and QL    PATIENT EDUCATION:  Education details: Issued HEP Person educated: Patient Education  method: Explanation, Demonstration, and Handouts Education comprehension: verbalized understanding  HOME EXERCISE PROGRAM: Access Code: [email or web address] URL: [email or web address] Date: 07/14/2022 Prepared by: Clydie Braun Menke  Exercises - Seated Scapular Retraction  - 1 x daily - 7 x weekly - 2 sets - 10 reps - Seated Cervical Retraction  - 1 x daily - 7 x weekly - 2 sets - 10 reps - Pelvic Circles on Swiss Ball  - 1 x daily - 7 x weekly - 2 sets - 10 reps - Swiss Ball March  - 1 x daily - 7 x weekly - 2 sets - 10 reps - Swiss Ball Knee Extension  - 1 x daily - 7 x weekly - 2 sets - 10 reps   Aquatics Access Code: [email or web address] URL: [email or web address] Date: 08/21/2022 Prepared by: Geni Bers *This aquatic home exercise program from MedBridge utilizes pictures from land based exercises, but has been adapted prior to lamination and issuance.    Exercises - Standing 'L' Stretch at [employer]  - 1 x daily - 7 x weekly - 3 sets - 10 reps - Hand Buoy Carry  - 1 x daily - 7 x weekly - 3 sets - 10 reps - Side lunge with hand buoys  - 1 x daily - 7 x weekly - 3 sets - 10 reps - Sit to Stand  - 1 x daily - 7 x weekly - 3 sets - 10 reps - Standing Hip Hinge  - 1 x daily - 7 x weekly - 3 sets - 10 reps - Noodle Press  - 1 x daily - 7 x weekly - 3 sets - 10 reps - Seated Straddle on Flotation Forward Breast Stroke Arms and Bicycle Legs  - 1 x daily - 7 x weekly - 3 sets - 10 reps - Noodle Stomp  - 1 x daily - 7 x weekly - 3 sets - 10 reps - Sitting Balance on Pool Noodle  - 1 x daily - 7 x weekly - 3 sets - 10 reps - Standing Balance on Noodle at [employer]  - 1 x daily - 7 x weekly - 3 sets - 10 reps  ASSESSMENT:  CLINICAL IMPRESSION Pt has cancelled his last land based appt with his MD blessing.  He presents today with wife/cg in pool with pt to instruct on aquatic HEP.  Pt with laminated copy. Instructions, verbal and demonstration,  given to complete noted exercises.  He requires minor adjustments in posture for improved toleration. Wife instructed as well to assist pt with completion.  They are about to gain membership here at [facility] to have access to pool. Plan to DC next session after final instruction on program.    GOALS: Goals reviewed with patient? Yes  SHORT TERM GOALS: Target date: 08/02/2022  Patient will be independent with initial HEP. Baseline: Goal status: MET  2.  Patient will report at least a 30% improvement in symptoms. Baseline:  Goal status: MET   LONG TERM GOALS: Target date: 09/08/2022  Patient will be independent with advanced HEP. Baseline:  Goal status: IN PROGRESS  2.  Patient will increase FOTO to at least 62% to demonstrate improvements in functional mobility. Baseline: 56% Goal status: INITIAL  3.  Patient will report ability to return to occasional jogging and/or playing golf without increased pain. Baseline:  Goal status: INITIAL  4.  Patient will report ability to wash his car with pain no greater than 3/10. Baseline:  Goal status: IN PROGRESS  5.  Patient will increase single leg stance time to at least 30 seconds to increase his stability/decrease risk of falling. Baseline: Right- 7.87 sec, Left- 16.94 sec Goal status: INITIAL   PLAN:  PT FREQUENCY: 2x/week  PT DURATION: 8 weeks  PLANNED INTERVENTIONS: Therapeutic exercises, Therapeutic activity, Neuromuscular re-education, Balance training, Gait training, Patient/Family education, Self Care, Joint mobilization, Joint manipulation, Stair training, Aquatic Therapy, Dry Needling, Electrical stimulation, Spinal manipulation, Spinal mobilization, Cryotherapy, Moist heat, Taping, Vasopneumatic device, Traction, Ultrasound, Ionotophoresis 4mg /ml Dexamethasone, Manual therapy, and Re-evaluation.  PLAN FOR NEXT SESSION: Assess and progress HEP as indicated, Follow up with traction and dry needling/manual therapy, core stability, balance, aquatic  PT   Rushie Chestnut) Aspynn Clover MPT 08/31/22, 1:19 PM  [facility] [address], [address] [place], [place] [address] Phone # [phone] Fax [phone]

## 2022-09-01 ENCOUNTER — Ambulatory Visit: Payer: 59 | Admitting: Rehabilitative and Restorative Service Providers"

## 2022-09-04 ENCOUNTER — Ambulatory Visit (HOSPITAL_BASED_OUTPATIENT_CLINIC_OR_DEPARTMENT_OTHER): Payer: 59 | Admitting: Physical Therapy

## 2022-09-05 ENCOUNTER — Encounter (HOSPITAL_BASED_OUTPATIENT_CLINIC_OR_DEPARTMENT_OTHER): Payer: Self-pay | Admitting: Physical Therapy

## 2022-09-05 ENCOUNTER — Ambulatory Visit (HOSPITAL_BASED_OUTPATIENT_CLINIC_OR_DEPARTMENT_OTHER): Payer: 59 | Admitting: Physical Therapy

## 2022-09-05 DIAGNOSIS — R252 Cramp and spasm: Secondary | ICD-10-CM

## 2022-09-05 DIAGNOSIS — M5459 Other low back pain: Secondary | ICD-10-CM | POA: Diagnosis not present

## 2022-09-05 DIAGNOSIS — M6281 Muscle weakness (generalized): Secondary | ICD-10-CM

## 2022-09-05 NOTE — Therapy (Signed)
OUTPATIENT PHYSICAL THERAPY TREATMENT NOTE   Patient Name: Timothy Leblanc MRN: [id number] DOB:03-Dec-1960, 62 y.o., male Today's Date: 09/05/2022  END OF SESSION:  PT End of Session - 09/05/22 1109     Visit Number 11    Date for PT Re-Evaluation 09/08/22    Authorization Type U[employer]    PT Start Time 1109    PT Stop Time 1150    PT Time Calculation (min) 41 min               Past Medical History:  Diagnosis Date   Diverticulitis 2016   Past Surgical History:  Procedure Laterality Date   COLONOSCOPY  2015   HYDROCELE EXCISION Right 09/08/2019   Procedure: HYDROCELE REPAIR;  Surgeon: Heloise Purpura, MD;  Location: [facility];  Service: Urology;  Laterality: Right;   IR 3D INDEPENDENT WKST  07/01/2021   IR ANGIO INTRA EXTRACRAN SEL COM CAROTID INNOMINATE UNI L MOD SED  07/01/2021   IR ANGIO INTRA EXTRACRAN SEL INTERNAL CAROTID UNI R MOD SED  07/01/2021   IR ANGIO VERTEBRAL SEL SUBCLAVIAN INNOMINATE UNI L MOD SED  07/01/2021   IR ANGIO VERTEBRAL SEL VERTEBRAL UNI R MOD SED  07/01/2021   IR RADIOLOGIST EVAL & MGMT  10/04/2021   IR US GUIDE VASC ACCESS RIGHT  07/01/2021   VASECTOMY  1996   Patient Active Problem List   Diagnosis Date Noted   Degenerative disc disease, lumbar 06/06/2022   Degenerative cervical disc 06/06/2022   Interstitial pulmonary disease (HCC) 09/07/2021   Stroke (HCC) 06/30/2021   Stroke (cerebrum) (HCC) 06/30/2021   Acute ischemic stroke (HCC) 06/30/2021    PCP: Ozella Rocks, MD  REFERRING PROVIDER: Judi Saa, DO  REFERRING DIAG: M51.36 (ICD-10-CM) - Degenerative disc disease, lumbar  Rationale for Evaluation and Treatment: Rehabilitation  THERAPY DIAG:  Other low back pain  Cramp and spasm  Muscle weakness (generalized)  ONSET DATE: at least 5 years, but has gotten worse in the past 2 years  SUBJECTIVE:                                                                                                                                                                                            SUBJECTIVE STATEMENT: Pt states, "this is the best I have felt in 3 wks".   Pt reports that he has joined [employer] and came to pool this weekend.  Pt reports he is ready to d/c at end of this visit.   PERTINENT HISTORY:  OA, Degenerative Disc Disease, CVA in 2023  PAIN:  Are you having pain? Yes: NPRS scale: cervical 0/10 and  back 0/10 Pain location: Pain description:  Aggravating factors: washing car, yard work Relieving factors: medication, rest  PRECAUTIONS: None  WEIGHT BEARING RESTRICTIONS: No  FALLS:  Has patient fallen in last 6 months? Yes. Number of falls 1 fall when working on his deck and walking across beams  LIVING ENVIRONMENT: Lives with: lives with their family and lives with their spouse Lives in: House/apartment Stairs: Yes: Internal: 12 steps; on left going up and External: 3 steps; bilateral but cannot reach both Has following equipment at home: None  OCCUPATION: Administrator, arts (works at a computer primarily)  PLOF: Independent and Leisure: playing golf, tennis, running.  However, has not been able to do secondary to pain.  PATIENT GOALS: Would like to be able to return to more active lifestyle of running and other desired sports.  NEXT MD VISIT:   OBJECTIVE:   DIAGNOSTIC FINDINGS:  Lumbar Radiograph on 06/06/2022: IMPRESSION: Degenerative changes. No acute osseous abnormalities.  Cervical Radiograph on 06/06/2022: IMPRESSION: Degenerative changes. No acute osseous abnormalities.  PATIENT SURVEYS:  Eval:  FOTO 56% (projected 62% by visit 10)   09/05/22: 79%   SENSATION: Reports occasionally has numbness and tingling in left hand  MUSCLE LENGTH: Hamstrings: slight tightness, but WFL  POSTURE: rounded shoulders and forward head  PALPATION: Pt with some muscle tightness noted in lumbar paraspinals.  Pt with reported SI joint pain.  LUMBAR ROM:   WFL with increased pain.   Reports that his neck "locks up" sometimes when he looks down.  LOWER EXTREMITY ROM:     WFL  UPPER/LOWER EXTREMITY MMT:    07/14/2022: UE is grossly WFL BLE strength is grossly at least 5-/5, except hip IR/ER are 4/5 bilat  LUMBAR SPECIAL TESTS:  Eval:  Slump test: Positive on left  FUNCTIONAL TESTS:  Eval: 5 times sit to stand: 9.07 sec Single Leg Stance:  Right- 7.87 sec, Left- 16.94 sec   09/05/22:  Rt -30 s; Lt: 30s  GAIT: Distance walked: >500 ft Assistive device utilized: None Level of assistance: Complete Independence Comments: Patient does not report pain with walking, only jogging/running  TODAY'S TREATMENT:                                                                                                                              09/05/22  SLS test; FOTO  Pt seen for aquatic therapy today.  Treatment took place in water 3.5-4.75 ft in depth at the [facility] pool. Temp of water was 91.  Pt entered/exited the pool via stairs using step to pattern with hand rail.  * in 4+ ft of water, without support:  walking forward/backward with cues for reciprocal arm swing and vertical trunk * hip hinge with yellow noodle x 10; single leg forward leans x 10 each * return to walking forward/ backward  * standard stance with trial of green (light) and pink (med) resistance bells in reciprocal arm swing and arm abdct/ addct  *  TrA set with yellow -> solid noodle pull down with eccentric, slow return of noodle to surface * Warrior 1 with noodle lift to ceiling x 5 each LE forward * staggered stance with kick board row x 10 each LE forward * wall push up/ off x 10 * STS on 3rd -> 4th step from bottom with cues for hip hinge, chin tucked, forward arm reach and controll descent x 10 * straddling yellow noodle and cycling * noodle under arms behind back with trial of 2 knee to chest crunches (trunk vertical, not supine)   08/31/22 Pt seen for aquatic therapy today.   Treatment took place in water 3.5-4.75 ft in depth at the [facility] pool. Temp of water was 91.  Pt entered/exited the pool via stairs using step to pattern with hand rail.  * in 4+ ft of water, without support:  walking forward/backward with cues for reciprocal arm swing and vertical trunk -HB carry - L stretch, cues for technique; Added tail wagging x 3 - Standing Hip Hinge   - Noodle Press   - Noodle Stomp   -side lunge with ue add/abd   Pt requires the buoyancy and hydrostatic pressure of water for support, and to offload joints by unweighting joint load by at least 50 % in navel deep water and by at least 75-80% in chest to neck deep water.  Viscosity of the water is needed for resistance of strengthening. Water current perturbations provides challenge to standing balance requiring increased core activation.  08/28/22  Pt seen for aquatic therapy today.  Treatment took place in water 3.5-4.75 ft in depth at the [facility] pool. Temp of water was 91.  Pt entered/exited the pool via stairs using step to pattern with hand rail.  * in 4+ ft of water, without support:  walking forward/backward with cues for reciprocal arm swing and vertical trunk * b KTC stretch with hands on rails and feet in 1st/2nd ladder hole * L stretch, cues for technique; Added tail wagging x 3 - Sit to Stand   - Standing Hip Hinge   - Noodle Press   - Noodle Stomp      Pt requires the buoyancy and hydrostatic pressure of water for support, and to offload joints by unweighting joint load by at least 50 % in navel deep water and by at least 75-80% in chest to neck deep water.  Viscosity of the water is needed for resistance of strengthening. Water current perturbations provides challenge to standing balance requiring increased core activation.    DATE: 08/25/2022  Recumbent bike L4 x 6 min for warm up Seated pball flexion 3x10 sec, L & R lateral flexion 3x10 sec Seated figure 4 stretch 2 x  30 sec Quadruped pelvic tilts 1 x 8  Leg press seat #8 115 lbs 2 x 10 reps Wall squats 3 x 15 sec  Standing bosu taps in mini squat position 3x 25 taps  Prone Ys with 1 x 10 with 2 sec hold  Prone Ts 1 x 10 with 2 sec hold Prone Is 1 x 10 with 2 sec hold  Bear holds 3 x 15 sec hold  Childs pose 2 x 20 sec  Seated figure 4 stretch 2 x 30 sec Seated hamstring stretch 2 x 30 sec  Gastroc rocker for 1 min  Standing hamstring stretch on stairs 2 x 20 sec  Eccentric calf raises 1 x 8   DATE: 08/21/2022   Pt seen for aquatic  therapy today.  Treatment took place in water 3.5-4.75 ft in depth at the [facility] pool. Temp of water was 91.  Pt entered/exited the pool via stairs using step to pattern with hand rail.  * in 4+ ft of water, without support:  walking forward/backward with cues for reciprocal arm swing and vertical trunk * b KTC stretch with hands on rails and feet in 1st/2nd ladder hole * L stretch, cues for technique; Added tail wagging x 3 * farmer carry with bilat/single yellow hand floats under water *TrA sets using solid then big yellow noodle wide stance then staggered moving from 4.100ft to 4.3 x10 ea;  *Noodle kick downs with solid noodle hip neutral then externally rotated 10 slow/10 quick uw support yellow HB *core engagement: seated on yellow noodle ue lifts unilaterally then bilaterally; ->pushing noodle down with knees to chest. *adductor set using BB 5 *STS from 3rd step x 5 with add set.  Pt requires the buoyancy and hydrostatic pressure of water for support, and to offload joints by unweighting joint load by at least 50 % in navel deep water and by at least 75-80% in chest to neck deep water.  Viscosity of the water is needed for resistance of strengthening. Water current perturbations provides challenge to standing balance requiring increased core activation.  DATE: 08/18/22 Recumbent bike L4 x 5 min for warm up Seated pball flexion 3x10 sec, L & R lateral  flexion 3x10 sec Seated figure 4 stretch 2 x 30 sec Child's pose 2x20  Bird dog 2x10 Prone shoulder ext 2x10 Prone "W" x10 Prone "T" x10 Prone "Y" x5 with manual assist Manual therapy Skilled assessment and palpation for TPDN Trigger Point Dry-Needling  Treatment instructions: Expect mild to moderate muscle soreness. S/S of pneumothorax if dry needled over a lung field, and to seek immediate medical attention should they occur. Patient verbalized understanding of these instructions and education. Patient Consent Given: Yes Education handout provided: Yes Muscles treated: bilateral lumbar multifidi, QL Electrical stimulation performed: No Parameters: N/A Treatment response/outcome: Utilized skilled palpation to identify bony landmarks and trigger points.  Able to illicit twitch response and muscle elongation.  Soft tissue mobilization following to further promote tissue elongation.  STM & TPR bilat lumbar paraspinal and QL    PATIENT EDUCATION:  Education details: updated aquatic HEP  Person educated: Patient Education method: Explanation, Demonstration, and Handouts Education comprehension: verbalized understanding  HOME EXERCISE PROGRAM: Access Code: [email or web address] URL: [email or web address] Date: 07/14/2022 Prepared by: Clydie Braun Menke  Exercises - Seated Scapular Retraction  - 1 x daily - 7 x weekly - 2 sets - 10 reps - Seated Cervical Retraction  - 1 x daily - 7 x weekly - 2 sets - 10 reps - Pelvic Circles on Swiss Ball  - 1 x daily - 7 x weekly - 2 sets - 10 reps - Swiss Ball March  - 1 x daily - 7 x weekly - 2 sets - 10 reps - Swiss Ball Knee Extension  - 1 x daily - 7 x weekly - 2 sets - 10 reps   Aquatics Access Code: [email or web address] URL: [email or web address] Date: 08/21/2022 Prepared by: Geni Bers *This aquatic home exercise program from MedBridge utilizes pictures from land based exercises, but has been adapted prior to lamination and  issuance.    ASSESSMENT:  CLINICAL IMPRESSION Pt tolerated all exercises in water well, without production of pain, just occasional tightness that was reduced with returning to walking in 4+ ft of water.  Updated  aquatic HEP.  Pt has met remaining goals, except LTG#3, which he defers due to these activities producing pain; he is not interested in attempting. Pt verbalized interest in d/c today.   Pt to continue HEP independently and reach out if any questions or concerns.      GOALS: Goals reviewed with patient? Yes  SHORT TERM GOALS: Target date: 08/02/2022  Patient will be independent with initial HEP. Baseline: Goal status: MET  2.  Patient will report at least a 30% improvement in symptoms. Baseline:  Goal status: MET   LONG TERM GOALS: Target date: 09/08/2022  Patient will be independent with advanced HEP. Baseline:  Goal status:MET - 09/05/22  2.  Patient will increase FOTO to at least 62% to demonstrate improvements in functional mobility. Baseline: 56% at eval;  79% at d/c Goal status: MET - 09/05/22  3.  Patient will report ability to return to occasional jogging and/or playing golf without increased pain. Baseline: pt avoiding these activities as they trigger increased pain Goal status:DEFERRED -09/05/22  4.  Patient will report ability to wash his car with pain no greater than 3/10. Baseline:  Goal status:MET - 09/05/22  5.  Patient will increase single leg stance time to at least 30 seconds to increase his stability/decrease risk of falling. Baseline: Right- 7.87 sec, Left- 16.94 sec at eval;  30 sec each - 09/05/22 (on land) Goal status:MET    PLAN:  PT FREQUENCY: 2x/week  PT DURATION: 8 weeks  PLANNED INTERVENTIONS: Therapeutic exercises, Therapeutic activity, Neuromuscular re-education, Balance training, Gait training, Patient/Family education, Self Care, Joint mobilization, Joint manipulation, Stair training, Aquatic Therapy, Dry Needling, Electrical  stimulation, Spinal manipulation, Spinal mobilization, Cryotherapy, Moist heat, Taping, Vasopneumatic device, Traction, Ultrasound, Ionotophoresis 4mg /ml Dexamethasone, Manual therapy, and Re-evaluation.  Mayer Camel, PTA 09/05/22 12:48 PM [facility] [address] [place], [place], [address] Phone: [phone]   Fax:  [phone]

## 2022-09-08 ENCOUNTER — Encounter: Payer: 59 | Admitting: Rehabilitative and Restorative Service Providers"

## 2022-10-05 ENCOUNTER — Ambulatory Visit: Payer: 59 | Admitting: Neurology

## 2022-10-05 ENCOUNTER — Encounter: Payer: Self-pay | Admitting: Neurology

## 2022-10-05 VITALS — BP 125/72 | HR 58 | Ht 69.0 in | Wt 207.0 lb

## 2022-10-05 DIAGNOSIS — G5603 Carpal tunnel syndrome, bilateral upper limbs: Secondary | ICD-10-CM

## 2022-10-05 DIAGNOSIS — G3184 Mild cognitive impairment, so stated: Secondary | ICD-10-CM | POA: Diagnosis not present

## 2022-10-05 DIAGNOSIS — R202 Paresthesia of skin: Secondary | ICD-10-CM | POA: Diagnosis not present

## 2022-10-05 DIAGNOSIS — R200 Anesthesia of skin: Secondary | ICD-10-CM

## 2022-10-05 MED ORDER — GABAPENTIN 100 MG PO CAPS: 200.0000 mg | ORAL_CAPSULE | Freq: Two times a day (BID) | ORAL | 0 refills | Status: DC

## 2022-10-05 NOTE — Patient Instructions (Signed)
I had a long discussion with the patient and his wife regarding his new complaints of bilateral hand paresthesias likely from carpal tunnel syndrome.  I recommend he limit activities involving rapid operative wrist flexion movements and use wrist extension splint as much as possible.  Continue gabapentin 200 mg twice daily as it seems to be helping.  Check EMG nerve conduction study.  Continue aspirin for stroke prevention and maintain aggressive risk factor modification with strict control of hypertension with blood pressure goal below 140/90, lipids with LDL cholesterol goal below 70 mg percent and diabetes with hemoglobin A1c goal below 6.5%.  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  I also encouraged him to increase participation in cognitively challenging activities like solving crossword puzzles, playing bridge and sudoku and we discussed memory compensation strategies for his mild cognitive impairment.  Return for follow-up in 4 months or call earlier if necessary.  Carpal Tunnel Syndrome  Carpal tunnel syndrome is a condition that causes pain, numbness, and weakness in your hand and fingers. The carpal tunnel is a narrow area located on the palm side of your wrist. Repeated wrist motion or certain diseases may cause swelling within the tunnel. This swelling pinches the main nerve in the wrist. The main nerve in the wrist is called the median nerve. What are the causes? This condition may be caused by: Repeated and forceful wrist and hand motions. Wrist injuries. Arthritis. A cyst or tumor in the carpal tunnel. Fluid buildup during pregnancy. Use of tools that vibrate. Sometimes the cause of this condition is not known. What increases the risk? The following factors may make you more likely to develop this condition: Having a job that requires you to repeatedly or forcefully move your wrist or hand or requires you to use  tools that vibrate. This may include jobs that involve using computers, working on an [employer], or working with power tools such as Radiographer, therapeutic. Being a woman. Having certain conditions, such as: Diabetes. Obesity. An underactive thyroid (hypothyroidism). Kidney failure. Rheumatoid arthritis. What are the signs or symptoms? Symptoms of this condition include: A tingling feeling in your fingers, especially in your thumb, index, and middle fingers. Tingling or numbness in your hand. An aching feeling in your entire arm, especially when your wrist and elbow are bent for a long time. Wrist pain that goes up your arm to your shoulder. Pain that goes down into your palm or fingers. A weak feeling in your hands. You may have trouble grabbing and holding items. Your symptoms may feel worse during the night. How is this diagnosed? This condition is diagnosed with a medical history and physical exam. You may also have tests, including: Electromyogram (EMG). This test measures electrical signals sent by your nerves into the muscles. Nerve conduction study. This test measures how well electrical signals pass through your nerves. Imaging tests, such as X-rays, ultrasound, and MRI. These tests check for possible causes of your condition. How is this treated? This condition may be treated with: Lifestyle changes. It is important to stop or change the activity that caused your condition. Doing exercise and activities to strengthen and stretch your muscles and tendons (physical therapy). Making lifestyle changes to help with your condition and learning how to do your daily activities safely (occupational therapy). Medicines for pain and inflammation. This may include medicine that is injected into your wrist. A wrist splint or brace. Surgery. Follow these instructions at home: If  you have a splint or brace: Wear the splint or brace as told by your health care provider. Remove it only as  told by your health care provider. Loosen the splint or brace if your fingers tingle, become numb, or turn cold and blue. Keep the splint or brace clean. If the splint or brace is not waterproof: Do not let it get wet. Cover it with a watertight covering when you take a bath or shower. Managing pain, stiffness, and swelling If directed, put ice on the painful area. To do this: If you have a removeable splint or brace, remove it as told by your health care provider. Put ice in a plastic bag. Place a towel between your skin and the bag or between the splint or brace and the bag. Leave the ice on for 20 minutes, 2-3 times a day. Do not fall asleep with the cold pack on your skin. Remove the ice if your skin turns bright red. This is very important. If you cannot feel pain, heat, or cold, you have a greater risk of damage to the area. Move your fingers often to reduce stiffness and swelling. General instructions Take over-the-counter and prescription medicines only as told by your health care provider. Rest your wrist and hand from any activity that may be causing your pain. If your condition is work related, talk with your employer about changes that can be made, such as getting a wrist pad to use while typing. Do any exercises as told by your health care provider, physical therapist, or occupational therapist. Keep all follow-up visits. This is important. Contact a health care provider if: You have new symptoms. Your pain is not controlled with medicines. Your symptoms get worse. Get help right away if: You have severe numbness or tingling in your wrist or hand. Summary Carpal tunnel syndrome is a condition that causes pain, numbness, and weakness in your hand and fingers. It is usually caused by repeated wrist motions. Lifestyle changes and medicines are used to treat carpal tunnel syndrome. Surgery may be recommended. Follow your health care provider's instructions about wearing a  splint, resting from activity, keeping follow-up visits, and calling for help. This information is not intended to replace advice given to you by your health care provider. Make sure you discuss any questions you have with your health care provider. Document Revised: 06/12/2019 Document Reviewed: 06/12/2019 Elsevier Patient Education  2024 [employer].
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
## 2022-11-30 ENCOUNTER — Encounter: Payer: 59 | Admitting: Neurology

## 2022-12-05 ENCOUNTER — Encounter: Payer: Self-pay | Admitting: Family Medicine

## 2022-12-12 NOTE — Progress Notes (Unsigned)
[facility] [address] [place] [address] Phone: [phone] Subjective:   INadine Counts, am serving as a scribe for Dr. Antoine Primas.  I'm seeing this patient by the request  of:  Timothy Rocks, MD  CC: Low back pain  BMW:[id number]  07/17/2022 Still having radicular symptoms especially down the left side of the arm at the moment.  Still difficult to decide if this is more post stroke related, cervical related or potentially some underlying carpal tunnel with patient doing a lot of repetitive activity.  At this point I would like patient to have a nerve conduction test done.  Continue the gabapentin and increase slowly.  We did discuss with patient at great length over the x-rays with him as well as his spouse today.  Total time with patient including looking at images 31 minutes.  Follow-up again in 2 months      Update 12/13/2022 Timothy Leblanc is a 62 y.o. male coming in with complaint of cervical spine pain. Patient states here for the low pain. Getting more stiffness in mid back, little below shoulder blades. Not able to bend over without pain. He isn't able to lay flat. Takes a while to relax. Doesn't know if any medications or home therapies are helping at this point. Increased gabapentin to 3 in the AM and 3 at PM. R knee different sensation. Feels like coarse sand with touch. Periodic radiating pain from low back to midback. Does use a lacrosse ball and some dry needling did help. Feels like the muscles are getting more and more inflamed and take longer to relax. Not comfortable period.      Past Medical History:  Diagnosis Date   Diverticulitis 2016   Past Surgical History:  Procedure Laterality Date   COLONOSCOPY  2015   HYDROCELE EXCISION Right 09/08/2019   Procedure: HYDROCELE REPAIR;  Surgeon: Heloise Purpura, MD;  Location: [facility];  Service: Urology;  Laterality: Right;   IR 3D INDEPENDENT WKST  07/01/2021   IR ANGIO INTRA  EXTRACRAN SEL COM CAROTID INNOMINATE UNI L MOD SED  07/01/2021   IR ANGIO INTRA EXTRACRAN SEL INTERNAL CAROTID UNI R MOD SED  07/01/2021   IR ANGIO VERTEBRAL SEL SUBCLAVIAN INNOMINATE UNI L MOD SED  07/01/2021   IR ANGIO VERTEBRAL SEL VERTEBRAL UNI R MOD SED  07/01/2021   IR RADIOLOGIST EVAL & MGMT  10/04/2021   IR US GUIDE VASC ACCESS RIGHT  07/01/2021   VASECTOMY  1996   Social History   Socioeconomic History   Marital status: Married    Spouse name: Not on file   Number of children: Not on file   Years of education: Not on file   Highest education level: Not on file  Occupational History   Not on file  Tobacco Use   Smoking status: Former    Current packs/day: 0.00    Average packs/day: 1 pack/day for 15.0 years (15.0 ttl pk-yrs)    Types: Cigarettes    Start date: 08/31/1981    Quit date: 08/31/1996    Years since quitting: 26.3   Smokeless tobacco: Never  Vaping Use   Vaping status: Never Used  Substance and Sexual Activity   Alcohol use: Yes    Alcohol/week: 2.0 standard drinks of alcohol    Types: 2 Standard drinks or equivalent per week   Drug use: Yes    Frequency: 1.0 times per week    Types: Marijuana  Comment: once every 2 weeks   Sexual activity: Not on file  Other Topics Concern   Not on file  Social History Narrative   Not on file   Social Determinants of Health   Financial Resource Strain: Not on file  Food Insecurity: Not on file  Transportation Needs: Not on file  Physical Activity: Not on file  Stress: Not on file  Social Connections: Not on file   No Known Allergies No family history on file.   Current Outpatient Medications (Cardiovascular):    rosuvastatin (CRESTOR) 40 MG tablet, TAKE 1 TABLET BY MOUTH EVERY DAY  Current Outpatient Medications (Respiratory):    cetirizine (ZYRTEC) 10 MG tablet, Take 10 mg by mouth daily as needed for allergies.  Current Outpatient Medications (Analgesics):    traMADol (ULTRAM) 50 MG tablet, Take 1 tablet  (50 mg total) by mouth every 8 (eight) hours as needed for up to 5 days.   acetaminophen (TYLENOL) 500 MG tablet, Take 500 mg by mouth every 6 (six) hours as needed.   ASPIRIN 81 PO, Take by mouth.   Current Outpatient Medications (Other):    diazepam (VALIUM) 5 MG tablet, One tab by mouth, 2 hours before procedure.   gabapentin (NEURONTIN) 100 MG capsule, Take 2 capsules (200 mg total) by mouth 2 (two) times daily.   tamsulosin (FLOMAX) 0.4 MG CAPS capsule, Take 0.4 mg by mouth daily.   Reviewed prior external information including notes and imaging from  primary care provider As well as notes that were available from care everywhere and other healthcare systems.  Past medical history, social, surgical and family history all reviewed in electronic medical record.  No pertanent information unless stated regarding to the chief complaint.   Review of Systems:  No headache, visual changes, nausea, vomiting, diarrhea, constipation, dizziness, abdominal pain, skin rash, fevers, chills, night sweats, weight loss, swollen lymph nodes, body aches, joint swelling, chest pain, shortness of breath, mood changes. POSITIVE muscle aches  Objective  Blood pressure 120/82, pulse 69, height 5\' 9"  (1.753 m), weight 210 lb (95.3 kg), SpO2 98%.   General: No apparent distress alert and oriented x3 mood and affect normal, dressed appropriately.  HEENT: Pupils equal, extraocular movements intact  Respiratory: Patient's speak in full sentences and does not appear short of breath  Cardiovascular: No lower extremity edema, non tender, no erythema  Lower back has significant loss of lordosis.  Significant tightness noted of the upper lumbar area for distribution.  Patient can even get to full neutral position secondary to increasing discomfort with extension.  Mild numbness noted also in the L4 and L5 distribution on the right greater than left.    Impression and Recommendations:    The above documentation has  been reviewed and is accurate and complete Judi Saa, DO

## 2022-12-13 ENCOUNTER — Ambulatory Visit: Payer: 59 | Admitting: Family Medicine

## 2022-12-13 VITALS — BP 120/82 | HR 69 | Ht 69.0 in | Wt 210.0 lb

## 2022-12-13 DIAGNOSIS — M51362 Other intervertebral disc degeneration, lumbar region with discogenic back pain and lower extremity pain: Secondary | ICD-10-CM | POA: Diagnosis not present

## 2022-12-13 DIAGNOSIS — M545 Low back pain, unspecified: Secondary | ICD-10-CM | POA: Diagnosis not present

## 2022-12-13 MED ORDER — DIAZEPAM 5 MG PO TABS: ORAL_TABLET | ORAL | 0 refills | Status: DC

## 2022-12-13 MED ORDER — TRAMADOL HCL 50 MG PO TABS: 50.0000 mg | ORAL_TABLET | Freq: Three times a day (TID) | ORAL | 0 refills | Status: AC | PRN

## 2022-12-13 NOTE — Patient Instructions (Signed)
Imaging [phone] Call Today  When we receive your results we will contact you.  

## 2022-12-13 NOTE — Assessment & Plan Note (Signed)
Acute worsening pain with neuropathy like symptoms.  Concerned that patient could have potentially had some type of injury to the back or acute fracture.  Did not show on x-ray but do feel that advanced imaging with an MRI is needed.  Valium given secondary to patient having some difficulty with claustrophobia.  Patient also having difficulty with laying in full extension at the moment.  With patient having this acute injuries we will see if patient does have any imaging that is consistent and changes any medical management.  Patient will follow-up after the imaging to discuss further.

## 2022-12-19 ENCOUNTER — Other Ambulatory Visit: Payer: Self-pay

## 2022-12-19 ENCOUNTER — Ambulatory Visit: Payer: 59 | Admitting: Family Medicine

## 2022-12-19 DIAGNOSIS — M51362 Other intervertebral disc degeneration, lumbar region with discogenic back pain and lower extremity pain: Secondary | ICD-10-CM

## 2022-12-21 ENCOUNTER — Encounter: Payer: Self-pay | Admitting: Family Medicine

## 2022-12-26 ENCOUNTER — Encounter: Payer: Self-pay | Admitting: Family Medicine

## 2022-12-26 MED ORDER — TRAMADOL HCL 50 MG PO TABS: 50.0000 mg | ORAL_TABLET | Freq: Four times a day (QID) | ORAL | 0 refills | Status: AC | PRN

## 2022-12-28 ENCOUNTER — Encounter: Payer: Self-pay | Admitting: Neurology

## 2023-01-06 ENCOUNTER — Other Ambulatory Visit: Payer: Self-pay | Admitting: Neurology

## 2023-01-23 ENCOUNTER — Telehealth: Payer: Self-pay | Admitting: Neurology

## 2023-01-23 NOTE — Telephone Encounter (Signed)
At 8:44 wife left vm asking NCV/EMG be cx, phone rep left vm for pt informing this has been done per their request.

## 2023-01-29 ENCOUNTER — Encounter: Payer: 59 | Admitting: Neurology

## 2023-03-22 ENCOUNTER — Other Ambulatory Visit: Payer: Self-pay | Admitting: Neurology

## 2023-04-16 ENCOUNTER — Emergency Department (HOSPITAL_BASED_OUTPATIENT_CLINIC_OR_DEPARTMENT_OTHER)

## 2023-04-16 ENCOUNTER — Inpatient Hospital Stay (HOSPITAL_BASED_OUTPATIENT_CLINIC_OR_DEPARTMENT_OTHER)
Admission: EM | Admit: 2023-04-16 | Discharge: 2023-04-18 | DRG: 071 | Disposition: A | Discharge status: Home / Self Care | Attending: Neurology | Admitting: Neurology

## 2023-04-16 ENCOUNTER — Encounter (HOSPITAL_BASED_OUTPATIENT_CLINIC_OR_DEPARTMENT_OTHER): Payer: Self-pay

## 2023-04-16 ENCOUNTER — Other Ambulatory Visit: Payer: Self-pay

## 2023-04-16 DIAGNOSIS — R001 Bradycardia, unspecified: Secondary | ICD-10-CM | POA: Diagnosis present

## 2023-04-16 DIAGNOSIS — Z7982 Long term (current) use of aspirin: Secondary | ICD-10-CM | POA: Diagnosis not present

## 2023-04-16 DIAGNOSIS — N400 Enlarged prostate without lower urinary tract symptoms: Secondary | ICD-10-CM | POA: Diagnosis present

## 2023-04-16 DIAGNOSIS — Z87891 Personal history of nicotine dependence: Secondary | ICD-10-CM

## 2023-04-16 DIAGNOSIS — Z8673 Personal history of transient ischemic attack (TIA), and cerebral infarction without residual deficits: Secondary | ICD-10-CM | POA: Diagnosis not present

## 2023-04-16 DIAGNOSIS — I63512 Cerebral infarction due to unspecified occlusion or stenosis of left middle cerebral artery: Secondary | ICD-10-CM | POA: Diagnosis not present

## 2023-04-16 DIAGNOSIS — R29810 Facial weakness: Secondary | ICD-10-CM | POA: Diagnosis present

## 2023-04-16 DIAGNOSIS — R4701 Aphasia: Secondary | ICD-10-CM | POA: Diagnosis present

## 2023-04-16 DIAGNOSIS — R55 Syncope and collapse: Secondary | ICD-10-CM | POA: Diagnosis not present

## 2023-04-16 DIAGNOSIS — Z6830 Body mass index (BMI) 30.0-30.9, adult: Secondary | ICD-10-CM

## 2023-04-16 DIAGNOSIS — R410 Disorientation, unspecified: Secondary | ICD-10-CM | POA: Diagnosis present

## 2023-04-16 DIAGNOSIS — E785 Hyperlipidemia, unspecified: Secondary | ICD-10-CM | POA: Diagnosis present

## 2023-04-16 DIAGNOSIS — G459 Transient cerebral ischemic attack, unspecified: Secondary | ICD-10-CM | POA: Diagnosis not present

## 2023-04-16 DIAGNOSIS — R569 Unspecified convulsions: Secondary | ICD-10-CM | POA: Diagnosis not present

## 2023-04-16 DIAGNOSIS — R29701 NIHSS score 1: Secondary | ICD-10-CM | POA: Diagnosis present

## 2023-04-16 DIAGNOSIS — G4733 Obstructive sleep apnea (adult) (pediatric): Secondary | ICD-10-CM | POA: Diagnosis present

## 2023-04-16 DIAGNOSIS — I10 Essential (primary) hypertension: Secondary | ICD-10-CM | POA: Diagnosis present

## 2023-04-16 DIAGNOSIS — G454 Transient global amnesia: Principal | ICD-10-CM | POA: Diagnosis present

## 2023-04-16 DIAGNOSIS — R2990 Unspecified symptoms and signs involving the nervous system: Secondary | ICD-10-CM | POA: Diagnosis not present

## 2023-04-16 DIAGNOSIS — E669 Obesity, unspecified: Secondary | ICD-10-CM | POA: Diagnosis present

## 2023-04-16 DIAGNOSIS — Z79899 Other long term (current) drug therapy: Secondary | ICD-10-CM

## 2023-04-16 DIAGNOSIS — R4182 Altered mental status, unspecified: Secondary | ICD-10-CM | POA: Diagnosis not present

## 2023-04-16 DIAGNOSIS — I639 Cerebral infarction, unspecified: Secondary | ICD-10-CM | POA: Diagnosis present

## 2023-04-16 LAB — COMPREHENSIVE METABOLIC PANEL
ALT: 24 U/L (ref 0–44)
AST: 17 U/L (ref 15–41)
Albumin: 5 g/dL (ref 3.5–5.0)
Alkaline Phosphatase: 84 U/L (ref 38–126)
Anion gap: 9 (ref 5–15)
BUN: 9 mg/dL (ref 8–23)
CO2: 25 mmol/L (ref 22–32)
Calcium: 10 mg/dL (ref 8.9–10.3)
Chloride: 103 mmol/L (ref 98–111)
Creatinine, Ser: 0.92 mg/dL (ref 0.61–1.24)
GFR, Estimated: >60 mL/min (ref >60)
Glucose, Bld: 93 mg/dL (ref 70–99)
Potassium: 3.8 mmol/L (ref 3.5–5.1)
Sodium: 137 mmol/L (ref 135–145)
Total Bilirubin: 0.9 mg/dL (ref 0.0–1.2)
Total Protein: 8 g/dL (ref 6.5–8.1)

## 2023-04-16 LAB — URINALYSIS, ROUTINE W REFLEX MICROSCOPIC
Bacteria, UA: NONE SEEN
Bilirubin Urine: NEGATIVE
Glucose, UA: NEGATIVE mg/dL
Ketones, ur: NEGATIVE mg/dL
Leukocytes,Ua: NEGATIVE
Nitrite: NEGATIVE
Protein, ur: NEGATIVE mg/dL
RBC / HPF: >50 RBC/hpf (ref 0–5)
Specific Gravity, Urine: >1.046 — ABNORMAL HIGH (ref 1.005–1.030)
pH: 6 (ref 5.0–8.0)

## 2023-04-16 LAB — CBC
HCT: 49.2 % (ref 39.0–52.0)
Hemoglobin: 16.8 g/dL (ref 13.0–17.0)
MCH: 29.2 pg (ref 26.0–34.0)
MCHC: 34.1 g/dL (ref 30.0–36.0)
MCV: 85.6 fL (ref 80.0–100.0)
Platelets: 180 10*3/uL (ref 150–400)
RBC: 5.75 MIL/uL (ref 4.22–5.81)
RDW: 13.4 % (ref 11.5–15.5)
WBC: 5.1 10*3/uL (ref 4.0–10.5)
nRBC: 0 % (ref 0.0–0.2)

## 2023-04-16 LAB — RAPID URINE DRUG SCREEN, HOSP PERFORMED
Amphetamines: NOT DETECTED
Barbiturates: NOT DETECTED
Benzodiazepines: NOT DETECTED
Cocaine: NOT DETECTED
Opiates: NOT DETECTED
Tetrahydrocannabinol: NOT DETECTED

## 2023-04-16 LAB — APTT: aPTT: 27 s (ref 24–36)

## 2023-04-16 LAB — DIFFERENTIAL
Abs Immature Granulocytes: 0.02 10*3/uL (ref 0.00–0.07)
Basophils Absolute: 0 10*3/uL (ref 0.0–0.1)
Basophils Relative: 1 %
Eosinophils Absolute: 0 10*3/uL (ref 0.0–0.5)
Eosinophils Relative: 1 %
Immature Granulocytes: 0 %
Lymphocytes Relative: 26 %
Lymphs Abs: 1.3 10*3/uL (ref 0.7–4.0)
Monocytes Absolute: 0.4 10*3/uL (ref 0.1–1.0)
Monocytes Relative: 8 %
Neutro Abs: 3.3 10*3/uL (ref 1.7–7.7)
Neutrophils Relative %: 64 %

## 2023-04-16 LAB — PROTIME-INR
INR: 1 (ref 0.8–1.2)
Prothrombin Time: 13 s (ref 11.4–15.2)

## 2023-04-16 LAB — ETHANOL: Alcohol, Ethyl (B): <10 mg/dL (ref <10)

## 2023-04-16 LAB — MRSA NEXT GEN BY PCR, NASAL: MRSA by PCR Next Gen: NOT DETECTED

## 2023-04-16 LAB — CBG MONITORING, ED
Glucose-Capillary: 88 mg/dL (ref 70–99)
Glucose-Capillary: 89 mg/dL (ref 70–99)

## 2023-04-16 LAB — GLUCOSE, CAPILLARY: Glucose-Capillary: 98 mg/dL (ref 70–99)

## 2023-04-16 MED ORDER — SODIUM CHLORIDE 0.9 % IV SOLN: INTRAVENOUS | Status: DC

## 2023-04-16 MED ORDER — SENNOSIDES-DOCUSATE SODIUM 8.6-50 MG PO TABS: 1.0000 | ORAL_TABLET | Freq: Every evening | ORAL | Status: DC | PRN

## 2023-04-16 MED ORDER — ACETAMINOPHEN 325 MG PO TABS
650.0000 mg | ORAL_TABLET | ORAL | Status: DC | PRN
  Administered 2023-04-16 – 2023-04-17 (×2): 650 mg via ORAL
  Filled 2023-04-16 (×2): qty 2

## 2023-04-16 MED ORDER — ACETAMINOPHEN 160 MG/5ML PO SOLN: 650.0000 mg | ORAL | Status: DC | PRN

## 2023-04-16 MED ORDER — ORAL CARE MOUTH RINSE: 15.0000 mL | OROMUCOSAL | Status: DC | PRN

## 2023-04-16 MED ORDER — TENECTEPLASE FOR STROKE
0.2500 mg/kg | PACK | Freq: Once | INTRAVENOUS | Status: AC
  Administered 2023-04-16: 23 mg via INTRAVENOUS

## 2023-04-16 MED ORDER — STROKE: EARLY STAGES OF RECOVERY BOOK
Freq: Once | Status: AC
  Filled 2023-04-16: qty 1

## 2023-04-16 MED ORDER — PANTOPRAZOLE SODIUM 40 MG IV SOLR
40.0000 mg | Freq: Every day | INTRAVENOUS | Status: DC
  Administered 2023-04-16: 40 mg via INTRAVENOUS
  Filled 2023-04-16: qty 10

## 2023-04-16 MED ORDER — IOHEXOL 350 MG/ML SOLN
100.0000 mL | Freq: Once | INTRAVENOUS | Status: AC | PRN
  Administered 2023-04-16: 75 mL via INTRAVENOUS

## 2023-04-16 MED ORDER — ACETAMINOPHEN 650 MG RE SUPP: 650.0000 mg | RECTAL | Status: DC | PRN

## 2023-04-16 MED ORDER — TENECTEPLASE FOR STROKE
PACK | INTRAVENOUS | Status: AC
  Filled 2023-04-16: qty 10

## 2023-04-16 MED ORDER — CHLORHEXIDINE GLUCONATE CLOTH 2 % EX PADS
6.0000 | MEDICATED_PAD | Freq: Every day | CUTANEOUS | Status: DC
  Administered 2023-04-16 – 2023-04-17 (×2): 6 via TOPICAL

## 2023-04-16 MED ORDER — CLEVIDIPINE BUTYRATE 0.5 MG/ML IV EMUL: 0.0000 mg/h | INTRAVENOUS | Status: DC

## 2023-04-16 NOTE — H&P (Addendum)
 NEUROLOGY H&P NOTE   Date of service: April 16, 2023 Patient Name: Timothy Leblanc MRN:  [id number] DOB:  03/29/1960 Chief Complaint: Acute onset of confusion  History of Present Illness  Timothy Leblanc is a 63 y.o. 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 Family reported last known well at 4:30 PM after which he started acting somewhat strange as his speech was off, he was not able to follow conversations, was confused as to where he was and could not tell the current month etc. He was brought in for emergent evaluation at [facility] where a code stroke was activated. He was seen emergently on camera by Teleneurology. NIHSS was 1 for inability to correctly answer an orientation question. Teleneurology suspected possible thromboembolism from the left ICA causing embolic strokes in the left MCA territory. Risk/benefits of and alternatives to IV TNKase were discussed with wife at bedside who agreed to proceed after considering the above.   CTA of head and neck revealed unchanged fusiform dilatation of approximately 6 mm at the distal left ICA just proximal to the skull base. No intraluminal thrombus or other filling defect. No intracranial large vessel occlusion or high-grade stenosis.  Wife gives additional history when patient is evaluated in the ICU. She states that at about 4:15 PM, his son saw him on a teleconference at home behaving normally. The son then took a shower and then saw his father standing near a staircase stating "I feel like I'm tripping". His father was speaking as though  confused. When his wife came home he still seemed confused, so she took him to the [facility] ED. There, he kept repeatedly asking the same questions about where he was and why he was there. After being reoriented, several minutes later he would ask the same questions, having apparently forgotten what he was just told. The patient states he can remember events from the morning as well as the conversation he had been having during the teleconference, but after that he has no memory, including that of his son finding him confused, the drive to the ED and the events at the [facility] ED, including the scans that were obtained. His first memory after that is the ambulance ride to [facility]. He is no longer asking repeated questions and wife feels that he is improving.      ROS  Complains of some neck and back pain. Other ROS as per HPI.  Past History   Past Medical History:  Diagnosis Date   Diverticulitis 2016   Past Surgical History:  Procedure Laterality Date   COLONOSCOPY  2015   HYDROCELE EXCISION Right 09/08/2019   Procedure: HYDROCELE REPAIR;  Surgeon: Heloise Purpura, MD;  Location: [facility];  Service: Urology;  Laterality: Right;   IR 3D INDEPENDENT WKST  07/01/2021   IR ANGIO INTRA EXTRACRAN SEL COM CAROTID INNOMINATE UNI L MOD SED  07/01/2021   IR ANGIO INTRA EXTRACRAN SEL INTERNAL CAROTID UNI R MOD SED  07/01/2021   IR ANGIO VERTEBRAL SEL SUBCLAVIAN INNOMINATE UNI L MOD SED  07/01/2021   IR ANGIO VERTEBRAL SEL VERTEBRAL UNI R MOD SED  07/01/2021  IR RADIOLOGIST EVAL & MGMT  10/04/2021   IR US GUIDE VASC ACCESS RIGHT  07/01/2021   VASECTOMY  1996   History reviewed. No pertinent family history. Social History   Socioeconomic History   Marital status: Married    Spouse name: Not on file   Number of children: Not on file   Years of education: Not on file   Highest education level: Not on file  Occupational History   Not on file  Tobacco Use   Smoking status: Former    Current packs/day: 0.00     Average packs/day: 1 pack/day for 15.0 years (15.0 ttl pk-yrs)    Types: Cigarettes    Start date: 08/31/1981    Quit date: 08/31/1996    Years since quitting: 26.6   Smokeless tobacco: Never  Vaping Use   Vaping status: Never Used  Substance and Sexual Activity   Alcohol use: Yes    Alcohol/week: 2.0 standard drinks of alcohol    Types: 2 Standard drinks or equivalent per week   Drug use: Yes    Frequency: 1.0 times per week    Types: Marijuana    Comment: once every 2 weeks   Sexual activity: Not on file  Other Topics Concern   Not on file  Social History Narrative   Not on file   Social Drivers of Health   Financial Resource Strain: Not on file  Food Insecurity: Not on file  Transportation Needs: Not on file  Physical Activity: Not on file  Stress: Not on file  Social Connections: Not on file   No Known Allergies  Medications   Current Facility-Administered Medications:    Chlorhexidine Gluconate Cloth 2 % PADS 6 each, 6 each, Topical, Daily, Caryl Pina, MD, 6 each at 04/16/23 1945   Oral care mouth rinse, 15 mL, Mouth Rinse, PRN, Caryl Pina, MD   tenecteplase (TNKASE) 50 MG injection for Stroke, , , ,    Vitals   Vitals:   04/16/23 1800 04/16/23 1821 04/16/23 1832 04/16/23 1849  BP: (!) 148/86 (!) 150/79 (!) 138/100 (!) 142/90  Pulse: 77 74 73 75  Resp: 19 16 17 18   Temp: 98.4 F (36.9 C) 98.5 F (36.9 C) 98.6 F (37 C) 98.5 F (36.9 C)  SpO2: 96% 97%  97%  Weight:         Body mass index is 30.15 kg/m.  Physical Exam   Physical Exam  HEENT-  [place]/AT    Lungs- Respirations unlabored  Neurological Examination Mental Status: Alert, and oriented. Thought content appropriate. Has amnesia for the events today after about 4 PM and before 7 PM. Memory is improving. Speech fluent without evidence of aphasia.   Cranial Nerves: III,IV, VI: No ptosis. EOMI.  VII: Mild left facial droop (chronic) VIII: Hearing intact to voice IX,X: No hoarseness XI:  Symmetric  XII: No lingual dysarthria Motor: No deficits noted.     Labs   CBC:  Recent Labs  Lab 04/16/23 1709  WBC 5.1  NEUTROABS 3.3  HGB 16.8  HCT 49.2  MCV 85.6  PLT 180   Basic Metabolic Panel:  Lab Results  Component Value Date   NA 137 04/16/2023   K 3.8 04/16/2023   CO2 25 04/16/2023   GLUCOSE 93 04/16/2023   BUN 9 04/16/2023   CREATININE 0.92 04/16/2023   CALCIUM 10.0 04/16/2023   GFRNONAA >60 04/16/2023   Lipid Panel:  Lab Results  Component Value Date   LDLCALC 61 06/06/2022  HgbA1c:  Lab Results  Component Value Date   HGBA1C 5.1 07/01/2021   Urine Drug Screen:     Component Value Date/Time   LABOPIA NONE DETECTED 04/16/2023 1837   COCAINSCRNUR NONE DETECTED 04/16/2023 1837   LABBENZ NONE DETECTED 04/16/2023 1837   AMPHETMU NONE DETECTED 04/16/2023 1837   THCU NONE DETECTED 04/16/2023 1837   LABBARB NONE DETECTED 04/16/2023 1837    Alcohol Level     Component Value Date/Time   ETH <10 04/16/2023 1709   INR  Lab Results  Component Value Date   INR 1.0 04/16/2023   APTT  Lab Results  Component Value Date   APTT 27 04/16/2023   CT head:  1. No acute intracranial abnormality. 2. ASPECTS is 10.   CTA of head and neck: 1. 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 No intraluminal thrombus or other filling defect. 2. No intracranial large vessel occlusion or high-grade stenosis.   Assessment  63 year old male with prior stroke due to left ICA dissection and pseudoaneurysm formation with no residual deficits, who presented to [facility] for evaluation of sudden onset confusion. On examination he had mild aphasia for complex commands and inability to answer orientation questions correctly. Teleneurology suspected possible thromboembolism from the left ICA causing embolic strokes in the left MCA territory. He was administered IV TNK at [facility] and transferred to [facility] for post-TNK  monitoring and further stroke management.  - Exam after arrival to [facility] reveals no deficits except for chronic left facial droop - Impression:  - Most likely etiology for his presentation, after further history obtained from wife, is transient global amnesia (TGA). - Stroke is also possible but lower on the DDx. Will need to evaluate for possible left hemispheric ischemic stroke status post IV TNKase (evaluate for stroke mimicking TGA). - Subclinical seizures can also present with TGA-like symptoms.   Recommendations  - Admitting to [facility] under neurology service-Dr. Otelia Limes accepting. - Post TNK vitals and neurochecks - No antiplatelets or anticoagulants - SCDs for DVT prophylaxis - 24-hour scan to rule out hemorrhagic transformation - Stat CT head in case of any neurological worsening - 2D echo - A1c - Lipid panel - Gentle hydration - EEG (ordered) - MRI brain (ordered) - Holding his statin due to neck and back pain, which patient and family both feel are due to his statin.  - CK level (ordered)   ______________________________________________________________________   Dessa Phi, Jovonna Nickell, MD [facility]

## 2023-04-16 NOTE — ED Notes (Signed)
 1659 activation for pt in triage arriving with lkwt apx 30 min ago with dizziness. 40 Page to neuro 33 Dr Wilford Corner joins cart 1709 Pt to Ct 1714 Pt returns from Ct 1722 bp 141/92 bs 88 1730 Time out performed 1732 Tnk in 1734 Dr Wilford Corner off cart Pt awaiting ct 1739 Called ED to inquire about time of CTA departure.   1745 Pt to ct

## 2023-04-16 NOTE — Progress Notes (Signed)
 eLink Physician-Brief Progress Note Patient Name: Timothy Leblanc DOB: 24-Dec-1960 MRN: [id number]   Date of Service  04/16/2023  HPI/Events of Note  62/M admitted with acute stroke, s/p TNK.  Aphasia and confusion was reported to be presenting symptom, and appears to have resolved.  Pt admitted to the ICU for close monitoring post TNK.   eICU Interventions  - Neurochecks per protocol. Plan for STAT CT if with acute clinical deterioration - BP control - Target SBP <125mmhg - Pt passed swallow eval in ED.  - Will follow BMP, serial glucose. Address abnormalities as warranted.  - This is pt's second stroke - with known L carotid dissection with pseudoaneurysm. Will defer additional imaging/workup to primary service.         Ambrosio Reuter M DELA CRUZ 04/16/2023, 9:32 PM  2:39 AM Pt became bradycardic to the 30s, felt nauseated, presyncopal.  He was given 1mg  atropine with return of his HR to his baseline HR 50s-60s.  Event occurred as soon as he had AM labs drawn. May have been vagal in nature.  Will give NS bolus now.  Will follow results of labs as well.

## 2023-04-16 NOTE — ED Triage Notes (Signed)
 Pt w family, approx ago pt "became very confused, didn't even remember his previous stroke.   Hx carotid artery CVA in May 2023

## 2023-04-16 NOTE — ED Provider Notes (Signed)
 [place] EMERGENCY DEPARTMENT AT [facility] Provider Note   CSN: [id number] Arrival date & time: 04/16/23  1655     History  CC: Stroke-like symptoms   Timothy Leblanc is a 63 y.o. male.  HPI This is a 63yo male with a history of prior MCA stroke due to left ICA dissection in 2023 who comes in brought in by family with concern for acute stroke after his son found him acutely confused and ?aphasic around 1630 this afternoon.  Last known well time was 20 to 30 minutes prior to that.  He has been in his usual state of health otherwise.  His son asked him what he was doing and he could not answer the question.  He did not recall why he was standing at the top of the stairs.  He is aware that he is confused. He cannot remember his previous stroke and does not recall the discussion with his son at the home.  He denies any weakness or sensory changes.  No visual field changes. He denies any recent etOH or drug use. Was on a call for work around the time of symptom onset.      Home Medications Prior to Admission medications   Medication Sig Start Date End Date Taking? Authorizing Provider  acetaminophen (TYLENOL) 500 MG tablet Take 500 mg by mouth every 6 (six) hours as needed.    [provider]  ASPIRIN 81 PO Take by mouth. 09/14/21   [provider]  cetirizine (ZYRTEC) 10 MG tablet Take 10 mg by mouth daily as needed for allergies.    [provider]  diazepam (VALIUM) 5 MG tablet One tab by mouth, 2 hours before procedure. 12/13/22   Judi Saa, DO  gabapentin (NEURONTIN) 100 MG capsule TAKE 2 CAPSULES BY MOUTH 2 TIMES DAILY. 03/23/23   Micki Riley, MD  rosuvastatin (CRESTOR) 40 MG tablet TAKE 1 TABLET BY MOUTH EVERY DAY 07/13/22   Micki Riley, MD  tamsulosin (FLOMAX) 0.4 MG CAPS capsule Take 0.4 mg by mouth daily. 07/02/19   [provider]      Allergies    Patient has no known allergies.    Review of Systems   Review of  Systems  Neurological:  Positive for speech difficulty. Negative for dizziness, weakness and numbness.  Psychiatric/Behavioral:  Positive for confusion.     Physical Exam Updated Vital Signs BP (!) 142/90   Pulse 75   Temp 98.5 F (36.9 C)   Resp 18   Wt 92.6 kg   SpO2 97%   BMI 30.15 kg/m  Physical Exam Vitals reviewed.  Constitutional:      General: He is not in acute distress.    Comments: He is alert but confused.  Cannot tell me what day it is.  Also does not know the month.  Eyes:     General: No visual field deficit. Cardiovascular:     Rate and Rhythm: Normal rate and regular rhythm.     Heart sounds: No murmur heard. Pulmonary:     Effort: Pulmonary effort is normal. No respiratory distress.     Breath sounds: No wheezing or rales.  Neurological:     Mental Status: He is confused.     Cranial Nerves: Cranial nerves 2-12 are intact. No dysarthria.     Sensory: Sensation is intact.     Motor: Motor function is intact. No weakness, tremor or abnormal muscle tone.     Coordination: Finger-Nose-Finger Test  normal.     Comments: Alert and answers all questions, but acknowledges confusion. Amnesia regarding events of the past 30 minutes.      ED Results / Procedures / Treatments   Labs (all labs ordered are listed, but only abnormal results are displayed) Labs Reviewed  URINALYSIS, ROUTINE W REFLEX MICROSCOPIC - Abnormal; Notable for the following components:      Result Value   Color, Urine COLORLESS (*)    Specific Gravity, Urine >1.046 (*)    Hgb urine dipstick LARGE (*)    All other components within normal limits  ETHANOL  PROTIME-INR  APTT  CBC  DIFFERENTIAL  COMPREHENSIVE METABOLIC PANEL  RAPID URINE DRUG SCREEN, HOSP PERFORMED  CBG MONITORING, ED  CBG MONITORING, ED    EKG None  Radiology CT ANGIO HEAD NECK W WO CM (CODE STROKE) Result Date: 04/16/2023 CLINICAL DATA:  Stroke follow-up EXAM: CT ANGIOGRAPHY HEAD AND NECK WITH AND WITHOUT  CONTRAST TECHNIQUE: Multidetector CT imaging of the head and neck was performed using the standard protocol during bolus administration of intravenous contrast. Multiplanar CT image reconstructions and MIPs were obtained to evaluate the vascular anatomy. Carotid stenosis measurements (when applicable) are obtained utilizing NASCET criteria, using the distal internal carotid diameter as the denominator. RADIATION DOSE REDUCTION: This exam was performed according to the departmental dose-optimization program which includes automated exposure control, adjustment of the mA and/or kV according to patient size and/or use of iterative reconstruction technique. CONTRAST:  75mL OMNIPAQUE IOHEXOL 350 MG/ML SOLN COMPARISON:  None Available. FINDINGS: CTA NECK FINDINGS Skeleton: No acute abnormality or high grade bony spinal canal stenosis. Other neck: Normal pharynx, larynx and major salivary glands. No cervical lymphadenopathy. Unremarkable thyroid gland. Upper chest: No pneumothorax or pleural effusion. No nodules or masses. Aortic arch: There is calcific atherosclerosis of the aortic arch. Conventional 3 vessel aortic branching pattern. RIGHT carotid system: Normal without aneurysm, dissection or stenosis. 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 No intraluminal thrombus or other filling defect. No stenosis. Vertebral arteries: Left dominant configuration. There is no dissection, occlusion or flow-limiting stenosis to the skull base (V1-V3 segments). CTA HEAD FINDINGS POSTERIOR CIRCULATION: Vertebral arteries are normal. No proximal occlusion of the anterior or inferior cerebellar arteries. Basilar artery is normal. Superior cerebellar arteries are normal. Posterior cerebral arteries are normal. ANTERIOR CIRCULATION: Intracranial internal carotid arteries are normal. Anterior cerebral arteries are normal. Middle cerebral arteries are  normal. Venous sinuses: As permitted by contrast timing, patent. Anatomic variants: None Review of the MIP images confirms the above findings. IMPRESSION: 1. 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 No intraluminal thrombus or other filling defect. 2. No intracranial large vessel occlusion or high-grade stenosis. Electronically Signed   By: Deatra Robinson M.D.   On: 04/16/2023 18:13   CT HEAD CODE STROKE WO CONTRAST Result Date: 04/16/2023 CLINICAL DATA:  Code stroke.  Confusion EXAM: CT HEAD WITHOUT CONTRAST TECHNIQUE: Contiguous axial images were obtained from the base of the skull through the vertex without intravenous contrast. RADIATION DOSE REDUCTION: This exam was performed according to the departmental dose-optimization program which includes automated exposure control, adjustment of the mA and/or kV according to patient size and/or use of iterative reconstruction technique. COMPARISON:  05/05/2022 FINDINGS: Brain: There is no mass, hemorrhage or extra-axial collection. The size and configuration of the ventricles and extra-axial CSF spaces are  normal. The brain parenchyma is normal, without evidence of acute or chronic infarction. Vascular: No abnormal hyperdensity of the major intracranial arteries or dural venous sinuses. No intracranial atherosclerosis. Skull: The visualized skull base, calvarium and extracranial soft tissues are normal. Sinuses/Orbits: No fluid levels or advanced mucosal thickening of the visualized paranasal sinuses. No mastoid or middle ear effusion. The orbits are normal. ASPECTS [facility] Stroke Program Early CT Score) - Ganglionic level infarction (caudate, lentiform nuclei, internal capsule, insula, M1-M3 cortex): 7 - Supraganglionic infarction (M4-M6 cortex): 3 Total score (0-10 with 10 being normal): 10 IMPRESSION: 1. No acute intracranial abnormality. 2. ASPECTS is 10. Electronically Signed   By: Deatra Robinson  M.D.   On: 04/16/2023 17:19    Procedures Procedures  Patient maintained on cardiac monitors for the duration of ED stay.   Medications Ordered in ED Medications  tenecteplase (TNKASE) 50 MG injection for Stroke (has no administration in time range)  tenecteplase (TNKASE) injection for Stroke 23 mg (23 mg Intravenous Given 04/16/23 1732)  iohexol (OMNIPAQUE) 350 MG/ML injection 100 mL (75 mLs Intravenous Contrast Given 04/16/23 1748)    ED Course/ Medical Decision Making/ A&P    Medical Decision Making Code stroke activated by triage. Patient being evaluated by teleneurology at the time of my assessment.  Concern for acute stroke with acute onset confusion and possible aphasia at the time of initial presentation. Other possible causes for his symptoms include metabolic encephalopathy or acute intoxication, though benign lab workup argues against this.  Noncontrast CT without acute intracranial abnormality. TNK ordered by Dr. Wilford Corner via teleneurology and administered at 1732.  Patient will be admitted to Neuro ICU at [facility] per Dr. Bess Harvest recommendation for post-TNK care.  Dr. Wilford Corner will sign patient out to Dr. Otelia Limes as the accepting physician.   1909 - patient reassessed. [facility] at bedside preparing to transport to ICU at [facility]. Improved cognition s/p TNK (now oriented to month and day of the week) but remains a bit confused by his own report. Remains with amnesia to the preceding events. He remains hemodynamically stable and overall well-appearing since administration of TNK.    Amount and/or Complexity of Data Reviewed Independent Historian: spouse    Details: Patient accompanied by spouse and son who assist in giving history. They tell me that this presentation is different form his prior MCA stroke in 2023.  Labs: ordered.    Details: CBC, CMP, PT-INR, aPTT all WNL. etOH negative. UDS pending.  Radiology: ordered.    Details: CT Head WO contrast negative for acute  intracranial abnormality  Discussion of management or test interpretation with external provider(s): Care coordination with Dr. Wilford Corner, neurology   Risk Prescription drug management. Decision regarding hospitalization.  Final Clinical Impression(s) / ED Diagnoses Final diagnoses:  Stroke-like symptoms    Rx / DC Orders ED Discharge Orders     None      J Dorothyann Gibbs, MD PGY-3 [facility] 04/16/23 7:11 PM     Alicia Amel, MD 04/16/23 1911    Franne Forts, DO 04/17/23 1904

## 2023-04-16 NOTE — ED Notes (Signed)
 1754 Pt bac to room

## 2023-04-16 NOTE — ED Notes (Signed)
 Transported patient to CT for code stroke.

## 2023-04-16 NOTE — Plan of Care (Signed)
 Process improvement note-delays in the process for TNK administration.  Significant delay in TNK administration-the RN was under the impression that the pharmacist would be mixing TNKase but there was no pharmacist which was not relayed to [place] and caused a significant delay.Order for TNK was placed at 1718 hrs.  TNK was administered at 1732 hrs.  Stroke quality team to make note and address.  -- Milon Dikes, MD Neurologist [facility]

## 2023-04-16 NOTE — ED Notes (Signed)
 Telestroke at bedside, Neuro assessing

## 2023-04-16 NOTE — ED Notes (Signed)
[place] with  cl called for transport 

## 2023-04-16 NOTE — Consult Note (Signed)
 [facility] Telemedicine Consult   Requesting Provider: Dr Wallace Cullens Consult Participants: Dr. Marthe Patch, Telespecialist RN Angi   Bedside RN Merry Proud Location of the provider: [facility], [place]  Location of the patient: [place] [facility]  This consult was provided via telemedicine with 2-way video and audio communication. The patient/family was informed that care would be provided in this way and agreed to receive care in this manner.   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 Family reports last known well at 4:30 PM after which she started acting somewhat strange as her speech was off and was not able to follow conversations and was confused as to where he has and could not really tell the current month etc. Brought in for emergent evaluation at [facility] where a code stroke was activated and he was seen emergently on camera.    Past Medical History:  Diagnosis Date   Diverticulitis 2016     Current Facility-Administered Medications:    tenecteplase (TNKASE) injection for Stroke 23 mg, 0.25 mg/kg, Intravenous, Once, Milon Dikes, MD  Current Outpatient Medications:    acetaminophen (TYLENOL) 500 MG tablet, Take 500 mg by mouth every 6 (six) hours as needed., Disp: , Rfl:    ASPIRIN 81 PO, Take by mouth., Disp: , Rfl:    cetirizine (ZYRTEC) 10 MG tablet, Take 10 mg by mouth daily as needed for allergies., Disp: , Rfl:    diazepam (VALIUM) 5 MG tablet, One tab by mouth, 2 hours before procedure., Disp: 2 tablet, Rfl: 0   gabapentin (NEURONTIN) 100 MG capsule,  TAKE 2 CAPSULES BY MOUTH 2 TIMES DAILY., Disp: 360 capsule, Rfl: 0   rosuvastatin (CRESTOR) 40 MG tablet, TAKE 1 TABLET BY MOUTH EVERY DAY, Disp: 90 tablet, Rfl: 3   tamsulosin (FLOMAX) 0.4 MG CAPS capsule, Take 0.4 mg by mouth daily., Disp: , Rfl:     LKW: 4:30 PM IV thrombolysis given?:  Yes IR Thrombectomy? No, no ELVO Modified Rankin Scale: 0-Completely asymptomatic and back to baseline post- stroke Time of teleneurologist evaluation: 1701 hrs.  Exam: Vitals:   04/16/23 1659  BP: (!) 146/96  Pulse: 70  Resp: 16  Temp: 97.9 F (36.6 C)  SpO2: 98%    General: Awake alert in no distress Neurological exam He is awake alert oriented to self and the fact that he is in some sort of a medical facility but could not tell me where it is, he could tell me his age 63 years but could not tell me the month.  He was able to follow most simple commands but was having some trouble with multistep commands following. No dysarthria. Cranial nerves II to XII intact Motor examination with no drift Sensation intact light touch Coordination examination shows no dysmetria   NIHSS 1A: Level of Consciousness - 0 1B: Ask Month and Age - 1 1C: 'Blink Eyes' & 'Squeeze Hands' - 0 2: Test Horizontal Extraocular Movements - 0 3: Test Visual Fields - 0 4: Test Facial Palsy - 0 5A: Test Left Arm Motor Drift - 0 5B: Test Right Arm Motor Drift - 0 6A: Test Left Leg Motor Drift -  0 6B: Test Right Leg Motor Drift - 0 7: Test Limb Ataxia - 0 8: Test Sensation - 0 9: Test Language/Aphasia- 0 10: Test Dysarthria - 0 11: Test Extinction/Inattention - 0 NIHSS score: 1   Imaging Reviewed: CT head-aspects 10.  No bleed.  Labs reviewed in epic and pertinent values follow: CBC    Component Value Date/Time   WBC 4.5 06/06/2022 1453   RBC 5.23 06/06/2022 1453   HGB 15.3 06/06/2022 1453   HCT 44.6 06/06/2022 1453   PLT 164.0 06/06/2022 1453   MCV 85.1 06/06/2022 1453   MCH 29.5 06/30/2021 1205    MCHC 34.2 06/06/2022 1453   RDW 13.8 06/06/2022 1453   LYMPHSABS 1.3 06/06/2022 1453   MONOABS 0.5 06/06/2022 1453   EOSABS 0.1 06/06/2022 1453   BASOSABS 0.1 06/06/2022 1453   CMP     Component Value Date/Time   NA 140 06/06/2022 1453   K 3.9 06/06/2022 1453   CL 105 06/06/2022 1453   CO2 27 06/06/2022 1453   GLUCOSE 96 06/06/2022 1453   BUN 11 06/06/2022 1453   CREATININE 0.88 06/06/2022 1453   CALCIUM 9.9 06/06/2022 1453   CALCIUM 10.0 06/06/2022 1453   PROT 7.2 06/06/2022 1453   ALBUMIN 4.7 06/06/2022 1453   AST 27 06/06/2022 1453   ALT 34 06/06/2022 1453   ALKPHOS 81 06/06/2022 1453   BILITOT 0.6 06/06/2022 1453   GFRNONAA >60 06/30/2021 1205     Assessment: 63 year old man with prior stroke due to left ICA dissection and pseudoaneurysm formation with no residual deficits, presenting for evaluation of sudden onset of confusion and on examination has mild aphasia for complex commands and inability to answer orientation questions correctly.  Suspect possible thromboembolism from the left ICA causing embolic strokes in the left MCA territory. Risk benefits alternatives of IV TNKase discussed with wife at bedside who agreed to proceed after considering the above. Vessel imaging being performed after TNK administration.  Impression: Evaluate for left hemispheric ischemic stroke status post IV TNKase  Recommendations: Admit to [facility] under neurology service-Dr. Otelia Limes accepting. Post TNK vitals and neurochecks No antiplatelets or anticoagulants SCD for DVT prophylaxis Will follow vascular imaging at this center 24-hour scan to rule out bleed while at [facility] Stat CT head in case of any neurological worsening 2D echo A1c Lipid panel Gentle hydration Correction of toxic metabolic derangements per the ED if seen on labs-I do not have BMP available at the time of this dictation.  CBC looks unremarkable. Plan was communicated at bedside with the  resident MD and via phone call with Dr. Wallace Cullens.  Risks benefits and alternatives of IV TNKase discussed CT head personally reviewed prior to TNK administration-no evidence of bleed   Discussed with EDP   ADDENDUM No LVO on CTA head and neck  -- Milon Dikes, MD Neurologist [facility] Pager: [phone]  CRITICAL CARE ATTESTATION Performed by: Milon Dikes, MD Total critical care time: 39 minutes Critical care time was exclusive of separately billable procedures and treating other patients and/or supervising APPs/Residents/Students Critical care was necessary to treat or prevent imminent or life-threatening deterioration. This patient is critically ill and at significant risk for neurological worsening and/or death and care requires constant monitoring. Critical care was time spent personally by me on the following activities: development of treatment plan with patient and/or surrogate as well as nursing, discussions with consultants, evaluation of patient's response to treatment, examination of patient, obtaining history from patient or surrogate,  ordering and performing treatments and interventions, ordering and review of laboratory studies, ordering and review of radiographic studies, pulse oximetry, re-evaluation of patient's condition, participation in multidisciplinary rounds and medical decision making of high complexity in the care of this patient.

## 2023-04-16 NOTE — ED Notes (Signed)
 1809 Off cart. Timothy Leblanc advises he has reached out to edp with cta results.

## 2023-04-16 NOTE — ED Notes (Signed)
 CT delay d/t Neuro requesting 2 mins

## 2023-04-16 NOTE — ED Notes (Signed)
 Code stroke CT head completed and patient returned to exam room per neurologist instruction.

## 2023-04-17 ENCOUNTER — Inpatient Hospital Stay (HOSPITAL_COMMUNITY)

## 2023-04-17 DIAGNOSIS — I10 Essential (primary) hypertension: Secondary | ICD-10-CM

## 2023-04-17 DIAGNOSIS — G454 Transient global amnesia: Secondary | ICD-10-CM

## 2023-04-17 DIAGNOSIS — G459 Transient cerebral ischemic attack, unspecified: Secondary | ICD-10-CM

## 2023-04-17 DIAGNOSIS — R001 Bradycardia, unspecified: Secondary | ICD-10-CM

## 2023-04-17 DIAGNOSIS — G4733 Obstructive sleep apnea (adult) (pediatric): Secondary | ICD-10-CM | POA: Diagnosis not present

## 2023-04-17 DIAGNOSIS — R55 Syncope and collapse: Secondary | ICD-10-CM | POA: Diagnosis not present

## 2023-04-17 DIAGNOSIS — R4182 Altered mental status, unspecified: Secondary | ICD-10-CM | POA: Diagnosis not present

## 2023-04-17 DIAGNOSIS — R569 Unspecified convulsions: Secondary | ICD-10-CM | POA: Diagnosis not present

## 2023-04-17 LAB — CBC
HCT: 43.3 % (ref 39.0–52.0)
Hemoglobin: 14.9 g/dL (ref 13.0–17.0)
MCH: 29.1 pg (ref 26.0–34.0)
MCHC: 34.4 g/dL (ref 30.0–36.0)
MCV: 84.6 fL (ref 80.0–100.0)
Platelets: 143 10*3/uL — ABNORMAL LOW (ref 150–400)
RBC: 5.12 MIL/uL (ref 4.22–5.81)
RDW: 13.2 % (ref 11.5–15.5)
WBC: 5.6 10*3/uL (ref 4.0–10.5)
nRBC: 0 % (ref 0.0–0.2)

## 2023-04-17 LAB — MAGNESIUM: Magnesium: 2 mg/dL (ref 1.7–2.4)

## 2023-04-17 LAB — LIPID PANEL
Cholesterol: 179 mg/dL (ref 0–200)
HDL: 50 mg/dL (ref >40)
LDL Cholesterol: 107 mg/dL — ABNORMAL HIGH (ref 0–99)
Total CHOL/HDL Ratio: 3.6 ratio
Triglycerides: 111 mg/dL (ref <150)
VLDL: 22 mg/dL (ref 0–40)

## 2023-04-17 LAB — ECHOCARDIOGRAM COMPLETE
AR max vel: 3.08 cm2
AV Area VTI: 3.21 cm2
AV Area mean vel: 2.68 cm2
AV Mean grad: 3 mmHg
AV Peak grad: 4.2 mmHg
Ao pk vel: 1.03 m/s
Area-P 1/2: 3.46 cm2
S' Lateral: 3.1 cm
Weight: 3266.34 [oz_av]

## 2023-04-17 LAB — BASIC METABOLIC PANEL
Anion gap: 9 (ref 5–15)
BUN: 8 mg/dL (ref 8–23)
CO2: 24 mmol/L (ref 22–32)
Calcium: 8.9 mg/dL (ref 8.9–10.3)
Chloride: 104 mmol/L (ref 98–111)
Creatinine, Ser: 1 mg/dL (ref 0.61–1.24)
GFR, Estimated: >60 mL/min (ref >60)
Glucose, Bld: 109 mg/dL — ABNORMAL HIGH (ref 70–99)
Potassium: 3.8 mmol/L (ref 3.5–5.1)
Sodium: 137 mmol/L (ref 135–145)

## 2023-04-17 LAB — GLUCOSE, CAPILLARY
Glucose-Capillary: 91 mg/dL (ref 70–99)
Glucose-Capillary: 117 mg/dL — ABNORMAL HIGH (ref 70–99)

## 2023-04-17 LAB — HIV ANTIBODY (ROUTINE TESTING W REFLEX): HIV Screen 4th Generation wRfx: NONREACTIVE

## 2023-04-17 LAB — HEMOGLOBIN A1C
Hgb A1c MFr Bld: 5.1 % (ref 4.8–5.6)
Mean Plasma Glucose: 99.67 mg/dL

## 2023-04-17 LAB — CK: Total CK: 41 U/L — ABNORMAL LOW (ref 49–397)

## 2023-04-17 MED ORDER — ATORVASTATIN CALCIUM 40 MG PO TABS: 40.0000 mg | ORAL_TABLET | Freq: Every day | ORAL | Status: DC

## 2023-04-17 MED ORDER — TAMSULOSIN HCL 0.4 MG PO CAPS
0.4000 mg | ORAL_CAPSULE | Freq: Every day | ORAL | Status: DC
  Administered 2023-04-17 – 2023-04-18 (×2): 0.4 mg via ORAL
  Filled 2023-04-17 (×2): qty 1

## 2023-04-17 MED ORDER — ASPIRIN 81 MG PO CHEW
81.0000 mg | CHEWABLE_TABLET | Freq: Every day | ORAL | Status: DC
  Administered 2023-04-17 – 2023-04-18 (×2): 81 mg via ORAL
  Filled 2023-04-17 (×3): qty 1

## 2023-04-17 MED ORDER — ENOXAPARIN SODIUM 40 MG/0.4ML IJ SOSY
40.0000 mg | PREFILLED_SYRINGE | Freq: Every day | INTRAMUSCULAR | Status: DC
  Administered 2023-04-17: 40 mg via SUBCUTANEOUS
  Filled 2023-04-17: qty 0.4

## 2023-04-17 MED ORDER — ATROPINE SULFATE 1 MG/10ML IJ SOSY: 1.0000 mg | PREFILLED_SYRINGE | Freq: Once | INTRAMUSCULAR | Status: AC

## 2023-04-17 MED ORDER — DEXTROSE 50 % IV SOLN
INTRAVENOUS | Status: AC
  Filled 2023-04-17: qty 50

## 2023-04-17 MED ORDER — ATROPINE SULFATE 1 MG/10ML IJ SOSY
PREFILLED_SYRINGE | INTRAMUSCULAR | Status: AC
  Administered 2023-04-17: 1 mg via INTRAVENOUS
  Filled 2023-04-17: qty 10

## 2023-04-17 MED ORDER — SODIUM CHLORIDE 0.9 % IV BOLUS
500.0000 mL | Freq: Once | INTRAVENOUS | Status: AC
  Administered 2023-04-17: 500 mL via INTRAVENOUS

## 2023-04-17 MED ORDER — DEXTROSE 50 % IV SOLN
12.5000 g | Freq: Once | INTRAVENOUS | Status: AC
  Administered 2023-04-17: 12.5 g via INTRAVENOUS

## 2023-04-17 MED ORDER — PANTOPRAZOLE SODIUM 40 MG PO TBEC: 40.0000 mg | DELAYED_RELEASE_TABLET | Freq: Every day | ORAL | Status: DC

## 2023-04-17 NOTE — Progress Notes (Signed)
 Attempted to call lab to add on CBC, BMP, and Magnesium to previous blood drawn following near syncopal episode. However, main lab states that they only obtained a lipid lab draw for this patient while there were previous labs to be drawn. Multiple attempts at calling phlebotomy when no answer. Changed CBC, BMP, Magnesium to STAT.   0347: Got in touch with phlebotomy and they states that it will take 30 minutes.

## 2023-04-17 NOTE — Procedures (Signed)
 Patient Name: ATHONY COPPA  MRN: [id number]  Epilepsy Attending: Charlsie Quest  Referring Physician/Provider: Caryl Pina, MD  Date: 04/17/2023  Duration: 22.53 mins  Patient history: 63yo M with ams. EEG to evaluate for seizure  Level of alertness: Awake  AEDs during EEG study: None  Technical aspects: This EEG study was done with scalp electrodes positioned according to the 10-20 International system of electrode placement. Electrical activity was reviewed with band pass filter of 1-70Hz , sensitivity of 7 uV/mm, display speed of 86mm/sec with a 60Hz  notched filter applied as appropriate. EEG data were recorded continuously and digitally stored.  Video monitoring was available and reviewed as appropriate.  Description: The posterior dominant rhythm consists of 9-10 Hz activity of moderate voltage (25-35 uV) seen predominantly in posterior head regions, symmetric and reactive to eye opening and eye closing. Hyperventilation and photic stimulation were not performed.     IMPRESSION: This study is within normal limits. No seizures or epileptiform discharges were seen throughout the recording.  A normal interictal EEG does not exclude the diagnosis of epilepsy.  [place] Annabelle Harman

## 2023-04-17 NOTE — Progress Notes (Signed)
 OT Cancellation Note  Patient Details Name: Timothy Leblanc MRN: [id number] DOB: [birth year]   Cancelled Treatment:    Reason Eval/Treat Not Completed: Active bedrest order  Lebron Quam, OTR/L [facility] [phone]   Dalphine Handing 04/17/2023, 7:27 AM

## 2023-04-17 NOTE — ED Provider Notes (Signed)
.  Critical Care  Performed by: Franne Forts, DO Authorized by: Franne Forts, DO   Critical care provider statement:    Critical care time (minutes):  75   Critical care was time spent personally by me on the following activities:  Development of treatment plan with patient or surrogate, discussions with consultants, evaluation of patient's response to treatment, examination of patient, ordering and review of laboratory studies, ordering and review of radiographic studies, ordering and performing treatments and interventions, pulse oximetry, re-evaluation of patient's condition and review of old charts     Franne Forts, DO 04/17/23 1904

## 2023-04-17 NOTE — Progress Notes (Signed)
 EEG complete - results pending.  GMD/BA

## 2023-04-17 NOTE — Progress Notes (Signed)
 STROKE TEAM PROGRESS NOTE   SUBJECTIVE (INTERVAL HISTORY) His wife is at the bedside.  Overall his condition is completely resolved. He could remember that he had teleconference at home and then in the ambulance transfer from [facility] to [facility].  His wife stated that in the ED in [facility], he kept asking same questions even given answer prior.  Overnight, patient had episode of bradycardia down to 30s while awake, feeling nauseous, pale, received atropine and heart rate recovered and he feels much better.  No more recurrence since.  He stated that this happened a couple times in the past and night in bed.   OBJECTIVE Temp:  [98.1 F (36.7 C)-98.7 F (37.1 C)] 98.1 F (36.7 C) (03/04 1546) Pulse Rate:  [35-105] 59 (03/04 1530) Cardiac Rhythm: Sinus bradycardia (03/04 0800) Resp:  [11-22] 17 (03/04 1530) BP: (85-166)/(48-142) 115/77 (03/04 1530) SpO2:  [92 %-98 %] 96 % (03/04 1530) Weight:  [92.6 kg] 92.6 kg (03/03 1716)  Recent Labs  Lab 04/16/23 1700 04/16/23 1720 04/16/23 1947 04/17/23 0239 04/17/23 0347  GLUCAP 89 88 98 91 117*   Recent Labs  Lab 04/16/23 1709 04/17/23 0437  NA 137 137  K 3.8 3.8  CL 103 104  CO2 25 24  GLUCOSE 93 109*  BUN 9 8  CREATININE 0.92 1.00  CALCIUM 10.0 8.9  MG  --  2.0   Recent Labs  Lab 04/16/23 1709  AST 17  ALT 24  ALKPHOS 84  BILITOT 0.9  PROT 8.0  ALBUMIN 5.0   Recent Labs  Lab 04/16/23 1709 04/17/23 0437  WBC 5.1 5.6  NEUTROABS 3.3  --   HGB 16.8 14.9  HCT 49.2 43.3  MCV 85.6 84.6  PLT 180 143*   Recent Labs  Lab 04/17/23 0427  CKTOTAL 41*   Recent Labs    04/16/23 1709  LABPROT 13.0  INR 1.0   Recent Labs    04/16/23 1837  COLORURINE COLORLESS*  LABSPEC >1.046*  PHURINE 6.0  GLUCOSEU NEGATIVE  HGBUR LARGE*  BILIRUBINUR NEGATIVE  KETONESUR NEGATIVE  PROTEINUR NEGATIVE  NITRITE NEGATIVE  LEUKOCYTESUR NEGATIVE       Component Value Date/Time   CHOL 179 04/17/2023 0228   CHOL 141 09/07/2021 1155   TRIG  111 04/17/2023 0228   HDL 50 04/17/2023 0228   HDL 67 09/07/2021 1155   CHOLHDL 3.6 04/17/2023 0228   VLDL 22 04/17/2023 0228   LDLCALC 107 (H) 04/17/2023 0228   LDLCALC 61 06/06/2022 1453   Lab Results  Component Value Date   HGBA1C 5.1 04/17/2023      Component Value Date/Time   LABOPIA NONE DETECTED 04/16/2023 1837   COCAINSCRNUR NONE DETECTED 04/16/2023 1837   LABBENZ NONE DETECTED 04/16/2023 1837   AMPHETMU NONE DETECTED 04/16/2023 1837   THCU NONE DETECTED 04/16/2023 1837   LABBARB NONE DETECTED 04/16/2023 1837    Recent Labs  Lab 04/16/23 1709  ETH <10    I have personally reviewed the radiological images below and agree with the radiology interpretations.  ECHOCARDIOGRAM COMPLETE Result Date: 04/17/2023    ECHOCARDIOGRAM REPORT   Patient Name:   DW[facility] HEMMELGARN Date of Exam: 04/17/2023 Medical Rec #:  [id number]           Height:       69.0 in Accession #:    [id number]          Weight:       204.1 lb Date of Birth:  1960/09/30  BSA:          2.084 m Patient Age:    62 years            BP:           11/76 mmHg Patient Gender: M                   HR:           57 bpm. Exam Location:  Inpatient Procedure: 2D Echo, Cardiac Doppler and Color Doppler (Both Spectral and Color            Flow Doppler were utilized during procedure). Indications:    Stroke I63.9  History:        Patient has prior history of Echocardiogram examinations, most                 recent 07/01/2021.  Sonographer:    Darlys Gales Referring Phys: [id number] ERIC LINDZEN IMPRESSIONS  1. Left ventricular ejection fraction, by estimation, is 60 to 65%. The left ventricle has normal function. Left ventricular endocardial border not optimally defined to evaluate regional wall motion. Left ventricular diastolic parameters were normal.  2. Right ventricular systolic function is normal. The right ventricular size is normal. Tricuspid regurgitation signal is inadequate for assessing PA pressure.  3. The mitral valve is  grossly normal. Trivial mitral valve regurgitation. No evidence of mitral stenosis.  4. The aortic valve is grossly normal. Aortic valve regurgitation is not visualized. No aortic stenosis is present. Comparison(s): No significant change from prior study. FINDINGS  Left Ventricle: Left ventricular ejection fraction, by estimation, is 60 to 65%. The left ventricle has normal function. Left ventricular endocardial border not optimally defined to evaluate regional wall motion. The left ventricular internal cavity size was normal in size. There is no left ventricular hypertrophy. Left ventricular diastolic parameters were normal. Right Ventricle: The right ventricular size is normal. No increase in right ventricular wall thickness. Right ventricular systolic function is normal. Tricuspid regurgitation signal is inadequate for assessing PA pressure. Left Atrium: Left atrial size was normal in size. Right Atrium: Right atrial size was normal in size. Pericardium: There is no evidence of pericardial effusion. Presence of epicardial fat layer. Mitral Valve: The mitral valve is grossly normal. Trivial mitral valve regurgitation. No evidence of mitral valve stenosis. Tricuspid Valve: The tricuspid valve is grossly normal. Tricuspid valve regurgitation is trivial. No evidence of tricuspid stenosis. Aortic Valve: The aortic valve is grossly normal. Aortic valve regurgitation is not visualized. No aortic stenosis is present. Aortic valve mean gradient measures 3.0 mmHg. Aortic valve peak gradient measures 4.2 mmHg. Aortic valve area, by VTI measures 3.21 cm. Pulmonic Valve: The pulmonic valve was grossly normal. Pulmonic valve regurgitation is trivial. No evidence of pulmonic stenosis. Aorta: The aortic root is normal in size and structure. Venous: The inferior vena cava was not well visualized. IAS/Shunts: The atrial septum is grossly normal.  LEFT VENTRICLE PLAX 2D LVIDd:         5.10 cm   Diastology LVIDs:         3.10 cm    LV e' medial:    8.70 cm/s LV PW:         0.80 cm   LV E/e' medial:  8.2 LV IVS:        0.80 cm   LV e' lateral:   7.94 cm/s LVOT diam:     2.00 cm   LV E/e' lateral: 9.0 LV SV:  61 LV SV Index:   29 LVOT Area:     3.14 cm  RIGHT VENTRICLE RV S prime:     15.90 cm/s TAPSE (M-mode): 1.9 cm LEFT ATRIUM             Index LA Vol (A2C):   27.4 ml 13.15 ml/m LA Vol (A4C):   33.5 ml 16.07 ml/m LA Biplane Vol: 32.3 ml 15.50 ml/m  AORTIC VALVE AV Area (Vmax):    3.08 cm AV Area (Vmean):   2.68 cm AV Area (VTI):     3.21 cm AV Vmax:           103.00 cm/s AV Vmean:          83.200 cm/s AV VTI:            0.190 m AV Peak Grad:      4.2 mmHg AV Mean Grad:      3.0 mmHg LVOT Vmax:         101.00 cm/s LVOT Vmean:        71.100 cm/s LVOT VTI:          0.194 m LVOT/AV VTI ratio: 1.02  AORTA Ao Root diam: 3.30 cm MITRAL VALVE MV Area (PHT): 3.46 cm    SHUNTS MV Decel Time: 219 msec    Systemic VTI:  0.19 m MV E velocity: 71.30 cm/s  Systemic Diam: 2.00 cm MV A velocity: 58.20 cm/s MV E/A ratio:  1.23 Lennie Odor MD Electronically signed by Lennie Odor MD Signature Date/Time: 04/17/2023/1:41:56 PM    Final    MR BRAIN WO CONTRAST Result Date: 04/17/2023 CLINICAL DATA:  63 year old male code stroke presentation yesterday. Neurologic deficit. EXAM: MRI HEAD WITHOUT CONTRAST TECHNIQUE: Multiplanar, multiecho pulse sequences of the brain and surrounding structures were obtained without intravenous contrast. COMPARISON:  CT head, CTA head and neck yesterday. Previous brain MRI 08/04/2022. FINDINGS: Brain: No restricted diffusion to suggest acute infarction. No midline shift, mass effect, evidence of mass lesion, ventriculomegaly, extra-axial collection or acute intracranial hemorrhage. Cervicomedullary junction and pituitary are within normal limits. Cerebral volume appears stable since last year, within normal limits for age. Patchy cortical (series 4, image 14), insula (image 12), and regional and white matter T2 and  FLAIR hyperintense encephalomalacia peers stable. Elsewhere gray and white matter signal appears normal for age. No chronic cerebral blood products identified on SWI. Deep gray nuclei, brainstem and cerebellum remain normal for age. Vascular: Major intracranial vascular flow voids are stable since last year. Skull and upper cervical spine: Stable, negative. Sinuses/Orbits: Stable, negative. Other: Mastoids remain Tabak. Grossly normal visible internal auditory structures. IMPRESSION: Stable patchy chronic Left MCA territory infarcts. No acute intracranial abnormality. Electronically Signed   By: Odessa Fleming M.D.   On: 04/17/2023 12:54   EEG adult Result Date: 04/17/2023 Charlsie Quest, MD     04/17/2023 12:26 PM Patient Name: ROHAAN DURNIL MRN: [id number] Epilepsy Attending: Charlsie Quest Referring Physician/Provider: Caryl Pina, MD Date: 04/17/2023 Duration: 22.53 mins Patient history: 63yo M with ams. EEG to evaluate for seizure Level of alertness: Awake AEDs during EEG study: None Technical aspects: This EEG study was done with scalp electrodes positioned according to the 10-20 International system of electrode placement. Electrical activity was reviewed with band pass filter of 1-70Hz , sensitivity of 7 uV/mm, display speed of 19mm/sec with a 60Hz  notched filter applied as appropriate. EEG data were recorded continuously and digitally stored.  Video monitoring was available and reviewed as appropriate. Description: The  posterior dominant rhythm consists of 9-10 Hz activity of moderate voltage (25-35 uV) seen predominantly in posterior head regions, symmetric and reactive to eye opening and eye closing. Hyperventilation and photic stimulation were not performed.   IMPRESSION: This study is within normal limits. No seizures or epileptiform discharges were seen throughout the recording. A normal interictal EEG does not exclude the diagnosis of epilepsy. Priyanka Annabelle Harman   CT ANGIO HEAD NECK W WO CM (CODE  STROKE) Result Date: 04/16/2023 CLINICAL DATA:  Stroke follow-up EXAM: CT ANGIOGRAPHY HEAD AND NECK WITH AND WITHOUT CONTRAST TECHNIQUE: Multidetector CT imaging of the head and neck was performed using the standard protocol during bolus administration of intravenous contrast. Multiplanar CT image reconstructions and MIPs were obtained to evaluate the vascular anatomy. Carotid stenosis measurements (when applicable) are obtained utilizing NASCET criteria, using the distal internal carotid diameter as the denominator. RADIATION DOSE REDUCTION: This exam was performed according to the departmental dose-optimization program which includes automated exposure control, adjustment of the mA and/or kV according to patient size and/or use of iterative reconstruction technique. CONTRAST:  75mL OMNIPAQUE IOHEXOL 350 MG/ML SOLN COMPARISON:  None Available. FINDINGS: CTA NECK FINDINGS Skeleton: No acute abnormality or high grade bony spinal canal stenosis. Other neck: Normal pharynx, larynx and major salivary glands. No cervical lymphadenopathy. Unremarkable thyroid gland. Upper chest: No pneumothorax or pleural effusion. No nodules or masses. Aortic arch: There is calcific atherosclerosis of the aortic arch. Conventional 3 vessel aortic branching pattern. RIGHT carotid system: Normal without aneurysm, dissection or stenosis. 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 No intraluminal thrombus or other filling defect. No stenosis. Vertebral arteries: Left dominant configuration. There is no dissection, occlusion or flow-limiting stenosis to the skull base (V1-V3 segments). CTA HEAD FINDINGS POSTERIOR CIRCULATION: Vertebral arteries are normal. No proximal occlusion of the anterior or inferior cerebellar arteries. Basilar artery is normal. Superior cerebellar arteries are normal. Posterior cerebral arteries are normal. ANTERIOR CIRCULATION:  Intracranial internal carotid arteries are normal. Anterior cerebral arteries are normal. Middle cerebral arteries are normal. Venous sinuses: As permitted by contrast timing, patent. Anatomic variants: None Review of the MIP images confirms the above findings. IMPRESSION: 1. 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 No intraluminal thrombus or other filling defect. 2. No intracranial large vessel occlusion or high-grade stenosis. Electronically Signed   By: Deatra Robinson M.D.   On: 04/16/2023 18:13   CT HEAD CODE STROKE WO CONTRAST Result Date: 04/16/2023 CLINICAL DATA:  Code stroke.  Confusion EXAM: CT HEAD WITHOUT CONTRAST TECHNIQUE: Contiguous axial images were obtained from the base of the skull through the vertex without intravenous contrast. RADIATION DOSE REDUCTION: This exam was performed according to the departmental dose-optimization program which includes automated exposure control, adjustment of the mA and/or kV according to patient size and/or use of iterative reconstruction technique. COMPARISON:  05/05/2022 FINDINGS: Brain: There is no mass, hemorrhage or extra-axial collection. The size and configuration of the ventricles and extra-axial CSF spaces are normal. The brain parenchyma is normal, without evidence of acute or chronic infarction. Vascular: No abnormal hyperdensity of the major intracranial arteries or dural venous sinuses. No intracranial atherosclerosis. Skull: The visualized skull base, calvarium and extracranial soft tissues are normal. Sinuses/Orbits: No fluid levels or advanced mucosal thickening of the visualized paranasal sinuses. No mastoid or middle ear effusion. The orbits are normal. ASPECTS [facility]  Stroke Program Early CT Score) - Ganglionic level infarction (caudate, lentiform nuclei, internal capsule, insula, M1-M3 cortex): 7 - Supraganglionic infarction (M4-M6 cortex): 3 Total score (0-10 with 10 being  normal): 10 IMPRESSION: 1. No acute intracranial abnormality. 2. ASPECTS is 10. Electronically Signed   By: Deatra Robinson M.D.   On: 04/16/2023 17:19     PHYSICAL EXAM  Temp:  [98.1 F (36.7 C)-98.7 F (37.1 C)] 98.1 F (36.7 C) (03/04 1546) Pulse Rate:  [35-105] 59 (03/04 1530) Resp:  [11-22] 17 (03/04 1530) BP: (85-166)/(48-142) 115/77 (03/04 1530) SpO2:  [92 %-98 %] 96 % (03/04 1530) Weight:  [92.6 kg] 92.6 kg (03/03 1716)  General - Well nourished, well developed, in no apparent distress.  Ophthalmologic - fundi not visualized due to noncooperation.  Cardiovascular - Regular rhythm and rate.  Mental Status -  Level of arousal and orientation to time, place, and person were intact. Language including expression, naming, repetition, comprehension was assessed and found intact. Attention span and concentration were normal. Fund of Knowledge was assessed and was intact.  Cranial Nerves II - XII - II - Visual field intact OU. III, IV, VI - Extraocular movements intact. V - Facial sensation intact bilaterally. VII - Facial movement intact bilaterally. VIII - Hearing & vestibular intact bilaterally. X - Palate elevates symmetrically. XI - Chin turning & shoulder shrug intact bilaterally. XII - Tongue protrusion intact.  Motor Strength - The patient's strength was normal in all extremities and pronator drift was absent.  Bulk was normal and fasciculations were absent.   Motor Tone - Muscle tone was assessed at the neck and appendages and was normal.  Reflexes - The patient's reflexes were symmetrical in all extremities and he had no pathological reflexes.  Sensory - Light touch, temperature/pinprick were assessed and were symmetrical.    Coordination - The patient had normal movements in the hands and feet with no ataxia or dysmetria.  Tremor was absent.  Gait and Station - deferred.   ASSESSMENT/PLAN Mr. JOSEP LUVIANO is a 63 y.o. 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 TNK given.    TGA, patient and family description more consistent with TGA Patient kept asking same question even given answered prior, lasted about 4 hours. CT no acute abnormality CT head and neck unchanged fusiform dilatation of approximately 6 mm at the distal left ICA just proximal to the skull base.  No thrombus or feeding defect. MRI no acute infarct.  Stable chronic left MCA infarcts 2D Echo EF 60 to 65% LDL 107 HgbA1c 5.1 UDS negative Lovenox for VTE prophylaxis aspirin 81 mg daily prior to admission, now on aspirin 81 mg daily.  Continue on discharge Ongoing aggressive stroke risk factor management Therapy recommendations: Pending Disposition: Pending  Bradycardia patient had episode of bradycardia down to 30s while awake, feeling nauseous, pale, received atropine and heart rate recovered and he feels much better.  No more recurrence since.  He stated that this happened a couple times in the past and night in bed. Currently heart rate 50s Cardiology consulted  History of stroke 06/2021 admitted for dizziness.  Found to have left MCA infarct.  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  Angiogram confirmed distal left ICA dissection, pseudoaneurysm, thrombus formation and severe 90% stenosis.  EF 60-65%, LDL 150, A1c 5.1.  Discharged on DAPT and Crestor 40. Follow with Dr.  Sethi at [facility], repeat CTA in 09/2021 and 01/02/2022 and 04/2022 showed resolved thrombosis within left ICA skull base level aneurysm/pseudoaneurysm, now stable 6 mm dilation.  BP management BP high on presentation Now stable Long term BP goal normotensive  Hyperlipidemia Home meds: Pravastatin 20 listed in medication list LDL 107, goal < 70 Patient stated that he is intolerant with even pravastatin 20 at home, has discussed with Dr. Pearlean Brownie and recommend to  follow-up with PCP to consider PCSK9 inhibitor.  Other Stroke Risk Factors Obesity, Body mass index is 30.15 kg/m.   Other Active Problems BPH on Flomax  Hospital day # 1  This patient is critically ill due to stat post TNK, bradycardia and at significant risk of neurological worsening, death form bleeding from TNK, cardiac rest. This patient's care requires constant monitoring of vital signs, hemodynamics, respiratory and cardiac monitoring, review of multiple databases, neurological assessment, discussion with family, other specialists and medical decision making of high complexity. I spent 50 minutes of neurocritical care time in the care of this patient. I had long discussion with patient and wife at bedside, updated pt current condition, treatment plan and potential prognosis, and answered all the questions.  They expressed understanding and appreciation.      Marvel Plan, MD PhD Stroke Neurology 04/17/2023 5:11 PM    To contact Stroke Continuity provider, please refer to [email or web address]. After hours, contact General Neurology

## 2023-04-17 NOTE — Consult Note (Addendum)
 Cardiology Consultation   Patient ID: CHUKWUEMEKA ARTOLA MRN: [id number]; DOB: October 27, 1960  Admit date: 04/16/2023 Date of Consult: 04/17/2023  PCP:  Ozella Rocks, MD   [facility] Providers Cardiologist:  None        Patient Profile:   Timothy Leblanc is a 63 y.o. 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 Stroke.  History of Present Illness:   Timothy Leblanc is a 63 year old male with the above medical history.  He does not appear to follow cardiology. 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  On examination he had mild aphasia for complex commands and inability to answer orientation questions correctly.  Teleneurology suspected possible thromboembolism from the left ICA causing embolic stroke into the left MCA territory.  Administered IV TNK at [facility] and transferred to [facility] for post TNKase monitoring and further stroke management.  Neurology was consulted upon arrival and exam reveals no deficits except for chronic left facial droop.  Neurology suspects most likely due to etiology due to transient global amnesia.  At this time stroke is low on the differential but they plan to continue to evaluate. BMP unremarkable except Glu 109. Normal renal function, Mg, LFT. CK mildly decreased to 41. LDL 107.   CTA head/neck: 04/16/23 1. 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 No  intraluminal thrombus or other filling defect. 2. No intracranial large vessel occlusion or high-grade stenosis.   MRI brain 04/17/23: Stable patchy chronic Left MCA territory infarcts. No acute intracranial abnormality  ECHO 04/17/23: LVEF 60-65% with normal LV function and diastolic parameters. Trivial MVR  Cardiology was consulted for bradycardia treated with atropine and nausea.  On my interview patient reported that last night he was asleep when he was woken up to get blood.  After blood draw, he reports feeling lightheaded and dizzy like he was going to pass out.  He denies any syncope, chest pain, and shortness of breath.  Denies any prior history of syncopal like symptoms.  He also reported history of OSA treated with CPAP, however he did not have his CPAP last night.  He also reports wearing a mazfit watch that has notified him in the past for low heart rates while he sleeping, as low as 31 bpm. He  confirm he does not have any cardiology history.    He is a previous smoker that quit in 1998.  Admits to EtOH 3-4 times a week consuming 1-2 beverages on those days.  He lives with his wife and works from home on his computer.   Past Medical History:  Diagnosis Date   Diverticulitis 2016    Past Surgical History:  Procedure Laterality Date   COLONOSCOPY  2015   HYDROCELE EXCISION Right 09/08/2019   Procedure: HYDROCELE REPAIR;  Surgeon: Heloise Purpura, MD;  Location: [facility];  Service: Urology;  Laterality: Right;   IR 3D INDEPENDENT WKST  07/01/2021  IR ANGIO INTRA EXTRACRAN SEL COM CAROTID INNOMINATE UNI L MOD SED  07/01/2021   IR ANGIO INTRA EXTRACRAN SEL INTERNAL CAROTID UNI R MOD SED  07/01/2021   IR ANGIO VERTEBRAL SEL SUBCLAVIAN INNOMINATE UNI L MOD SED  07/01/2021   IR ANGIO VERTEBRAL SEL VERTEBRAL UNI R MOD SED  07/01/2021   IR RADIOLOGIST EVAL & MGMT  10/04/2021   IR US GUIDE VASC ACCESS RIGHT  07/01/2021   VASECTOMY  1996       Inpatient Medications: Scheduled Meds:  aspirin   81 mg Oral Daily   Chlorhexidine Gluconate Cloth  6 each Topical Daily   enoxaparin (LOVENOX) injection  40 mg Subcutaneous QHS   tamsulosin  0.4 mg Oral QPC supper   Continuous Infusions:  sodium chloride 40 mL/hr at 04/17/23 1500   clevidipine     PRN Meds: acetaminophen **OR** acetaminophen (TYLENOL) oral liquid 160 mg/5 mL **OR** acetaminophen, mouth rinse, senna-docusate  Allergies:   No Known Allergies  Social History:   Social History   Socioeconomic History   Marital status: Married    Spouse name: Not on file   Number of children: Not on file   Years of education: Not on file   Highest education level: Not on file  Occupational History   Not on file  Tobacco Use   Smoking status: Former    Current packs/day: 0.00    Average packs/day: 1 pack/day for 15.0 years (15.0 ttl pk-yrs)    Types: Cigarettes    Start date: 08/31/1981    Quit date: 08/31/1996    Years since quitting: 26.6   Smokeless tobacco: Never  Vaping Use   Vaping status: Never Used  Substance and Sexual Activity   Alcohol use: Yes    Alcohol/week: 2.0 standard drinks of alcohol    Types: 2 Standard drinks or equivalent per week   Drug use: Yes    Frequency: 1.0 times per week    Types: Marijuana    Comment: once every 2 weeks   Sexual activity: Not on file  Other Topics Concern   Not on file  Social History Narrative   Not on file   Social Drivers of Health   Financial Resource Strain: Not on file  Food Insecurity: Not on file  Transportation Needs: Not on file  Physical Activity: Not on file  Stress: Not on file  Social Connections: Not on file  Intimate Partner Violence: Not on file    Family History:   History reviewed. No pertinent family history.   ROS:  Please see the history of present illness.  All other ROS reviewed and negative.     Physical Exam/Data:   Vitals:   04/17/23 1300 04/17/23 1400 04/17/23 1430 04/17/23 1500  BP: 108/75 113/66 108/74 113/82  Pulse: (!) 47  (!) 56 (!) 51 (!) 51  Resp: (!) 22 16 16 12   Temp:      TempSrc:      SpO2: 95% 94% 95% 93%  Weight:        Intake/Output Summary (Last 24 hours) at 04/17/2023 1538 Last data filed at 04/17/2023 1500 Gross per 24 hour  Intake 547.66 ml  Output 830 ml  Net -282.34 ml      04/16/2023    5:16 PM 12/13/2022   10:17 AM 10/05/2022   10:11 AM  Last 3 Weights  Weight (lbs) 204 lb 2.3 oz 210 lb 207 lb  Weight (kg) 92.6 kg 95.255 kg 93.895 kg  Body mass index is 30.15 kg/m.  General:  Sitting upright in bed with no acute distress HEENT: normal Neck: no JVD Vascular: No carotid bruits; Distal pulses 2+ bilaterally Cardiac:  normal S1, S2; RRR; no murmur  Lungs:  Hewins to auscultation bilaterally, no wheezing, rhonchi or rales  Abd: soft, nontender, no hepatomegaly  Ext: no edema Musculoskeletal:  No deformities, BUE and BLE strength normal and equal Skin: warm and dry  Neuro:  CNs 2-12 intact, no focal abnormalities noted Psych:  Normal affect   EKG:  The EKG was personally reviewed and demonstrates:  NSR, HR 72 with non specific ST changes Telemetry:  Telemetry was personally reviewed and demonstrates: Alternates between NSR and Sinus Bradycardia baseline artifact, no atrial or ventricular abnormalities   Relevant CV Studies: ECHO 04/17/23 IMPRESSIONS   1. Left ventricular ejection fraction, by estimation, is 60 to 65%. The  left ventricle has normal function. Left ventricular endocardial border  not optimally defined to evaluate regional wall motion. Left ventricular  diastolic parameters were normal.   2. Right ventricular systolic function is normal. The right ventricular  size is normal. Tricuspid regurgitation signal is inadequate for assessing  PA pressure.   3. The mitral valve is grossly normal. Trivial mitral valve  regurgitation. No evidence of mitral stenosis.   4. The aortic valve is grossly normal. Aortic valve regurgitation is not  visualized. No aortic stenosis  is present.   Comparison(s): No significant change from prior study.   Laboratory Data:  High Sensitivity Troponin:  No results for input(s): "TROPONINIHS" in the last 720 hours.   Chemistry Recent Labs  Lab 04/16/23 1709 04/17/23 0437  NA 137 137  K 3.8 3.8  CL 103 104  CO2 25 24  GLUCOSE 93 109*  BUN 9 8  CREATININE 0.92 1.00  CALCIUM 10.0 8.9  MG  --  2.0  GFRNONAA >60 >60  ANIONGAP 9 9    Recent Labs  Lab 04/16/23 1709  PROT 8.0  ALBUMIN 5.0  AST 17  ALT 24  ALKPHOS 84  BILITOT 0.9   Lipids  Recent Labs  Lab 04/17/23 0228  CHOL 179  TRIG 111  HDL 50  LDLCALC 107*  CHOLHDL 3.6    Hematology Recent Labs  Lab 04/16/23 1709 04/17/23 0437  WBC 5.1 5.6  RBC 5.75 5.12  HGB 16.8 14.9  HCT 49.2 43.3  MCV 85.6 84.6  MCH 29.2 29.1  MCHC 34.1 34.4  RDW 13.4 13.2  PLT 180 143*   Thyroid No results for input(s): "TSH", "FREET4" in the last 168 hours.  BNPNo results for input(s): "BNP", "PROBNP" in the last 168 hours.  DDimer No results for input(s): "DDIMER" in the last 168 hours.   Radiology/Studies:  ECHOCARDIOGRAM COMPLETE Result Date: 04/17/2023    ECHOCARDIOGRAM REPORT   Patient Name:   Timothy Leblanc Date of Exam: 04/17/2023 Medical Rec #:  [id number]           Height:       69.0 in Accession #:    [id number]          Weight:       204.1 lb Date of Birth:  24-Jul-1960           BSA:          2.084 m Patient Age:    62 years            BP:           11/76  mmHg Patient Gender: M                   HR:           57 bpm. Exam Location:  Inpatient Procedure: 2D Echo, Cardiac Doppler and Color Doppler (Both Spectral and Color            Flow Doppler were utilized during procedure). Indications:    Stroke I63.9  History:        Patient has prior history of Echocardiogram examinations, most                 recent 07/01/2021.  Sonographer:    Darlys Gales Referring Phys: [phone] ERIC LINDZEN IMPRESSIONS  1. Left ventricular ejection fraction, by estimation, is 60 to  65%. The left ventricle has normal function. Left ventricular endocardial border not optimally defined to evaluate regional wall motion. Left ventricular diastolic parameters were normal.  2. Right ventricular systolic function is normal. The right ventricular size is normal. Tricuspid regurgitation signal is inadequate for assessing PA pressure.  3. The mitral valve is grossly normal. Trivial mitral valve regurgitation. No evidence of mitral stenosis.  4. The aortic valve is grossly normal. Aortic valve regurgitation is not visualized. No aortic stenosis is present. Comparison(s): No significant change from prior study. FINDINGS  Left Ventricle: Left ventricular ejection fraction, by estimation, is 60 to 65%. The left ventricle has normal function. Left ventricular endocardial border not optimally defined to evaluate regional wall motion. The left ventricular internal cavity size was normal in size. There is no left ventricular hypertrophy. Left ventricular diastolic parameters were normal. Right Ventricle: The right ventricular size is normal. No increase in right ventricular wall thickness. Right ventricular systolic function is normal. Tricuspid regurgitation signal is inadequate for assessing PA pressure. Left Atrium: Left atrial size was normal in size. Right Atrium: Right atrial size was normal in size. Pericardium: There is no evidence of pericardial effusion. Presence of epicardial fat layer. Mitral Valve: The mitral valve is grossly normal. Trivial mitral valve regurgitation. No evidence of mitral valve stenosis. Tricuspid Valve: The tricuspid valve is grossly normal. Tricuspid valve regurgitation is trivial. No evidence of tricuspid stenosis. Aortic Valve: The aortic valve is grossly normal. Aortic valve regurgitation is not visualized. No aortic stenosis is present. Aortic valve mean gradient measures 3.0 mmHg. Aortic valve peak gradient measures 4.2 mmHg. Aortic valve area, by VTI measures 3.21 cm.  Pulmonic Valve: The pulmonic valve was grossly normal. Pulmonic valve regurgitation is trivial. No evidence of pulmonic stenosis. Aorta: The aortic root is normal in size and structure. Venous: The inferior vena cava was not well visualized. IAS/Shunts: The atrial septum is grossly normal.  LEFT VENTRICLE PLAX 2D LVIDd:         5.10 cm   Diastology LVIDs:         3.10 cm   LV e' medial:    8.70 cm/s LV PW:         0.80 cm   LV E/e' medial:  8.2 LV IVS:        0.80 cm   LV e' lateral:   7.94 cm/s LVOT diam:     2.00 cm   LV E/e' lateral: 9.0 LV SV:         61 LV SV Index:   29 LVOT Area:     3.14 cm  RIGHT VENTRICLE RV S prime:     15.90 cm/s TAPSE (M-mode): 1.9 cm LEFT ATRIUM  Index LA Vol (A2C):   27.4 ml 13.15 ml/m LA Vol (A4C):   33.5 ml 16.07 ml/m LA Biplane Vol: 32.3 ml 15.50 ml/m  AORTIC VALVE AV Area (Vmax):    3.08 cm AV Area (Vmean):   2.68 cm AV Area (VTI):     3.21 cm AV Vmax:           103.00 cm/s AV Vmean:          83.200 cm/s AV VTI:            0.190 m AV Peak Grad:      4.2 mmHg AV Mean Grad:      3.0 mmHg LVOT Vmax:         101.00 cm/s LVOT Vmean:        71.100 cm/s LVOT VTI:          0.194 m LVOT/AV VTI ratio: 1.02  AORTA Ao Root diam: 3.30 cm MITRAL VALVE MV Area (PHT): 3.46 cm    SHUNTS MV Decel Time: 219 msec    Systemic VTI:  0.19 m MV E velocity: 71.30 cm/s  Systemic Diam: 2.00 cm MV A velocity: 58.20 cm/s MV E/A ratio:  1.23 Lennie Odor MD Electronically signed by Lennie Odor MD Signature Date/Time: 04/17/2023/1:41:56 PM    Final    MR BRAIN WO CONTRAST Result Date: 04/17/2023 CLINICAL DATA:  64 year old male code stroke presentation yesterday. Neurologic deficit. EXAM: MRI HEAD WITHOUT CONTRAST TECHNIQUE: Multiplanar, multiecho pulse sequences of the brain and surrounding structures were obtained without intravenous contrast. COMPARISON:  CT head, CTA head and neck yesterday. Previous brain MRI 08/04/2022. FINDINGS: Brain: No restricted diffusion to suggest acute  infarction. No midline shift, mass effect, evidence of mass lesion, ventriculomegaly, extra-axial collection or acute intracranial hemorrhage. Cervicomedullary junction and pituitary are within normal limits. Cerebral volume appears stable since last year, within normal limits for age. Patchy cortical (series 4, image 14), insula (image 12), and regional and white matter T2 and FLAIR hyperintense encephalomalacia peers stable. Elsewhere gray and white matter signal appears normal for age. No chronic cerebral blood products identified on SWI. Deep gray nuclei, brainstem and cerebellum remain normal for age. Vascular: Major intracranial vascular flow voids are stable since last year. Skull and upper cervical spine: Stable, negative. Sinuses/Orbits: Stable, negative. Other: Mastoids remain Scerbo. Grossly normal visible internal auditory structures. IMPRESSION: Stable patchy chronic Left MCA territory infarcts. No acute intracranial abnormality. Electronically Signed   By: Odessa Fleming M.D.   On: 04/17/2023 12:54   EEG adult Result Date: 04/17/2023 Charlsie Quest, MD     04/17/2023 12:26 PM Patient Name: Timothy Leblanc MRN: [id number] Epilepsy Attending: Charlsie Quest Referring Physician/Provider: Caryl Pina, MD Date: 04/17/2023 Duration: 22.53 mins Patient history: 63yo M with ams. EEG to evaluate for seizure Level of alertness: Awake AEDs during EEG study: None Technical aspects: This EEG study was done with scalp electrodes positioned according to the 10-20 International system of electrode placement. Electrical activity was reviewed with band pass filter of 1-70Hz , sensitivity of 7 uV/mm, display speed of 54mm/sec with a 60Hz  notched filter applied as appropriate. EEG data were recorded continuously and digitally stored.  Video monitoring was available and reviewed as appropriate. Description: The posterior dominant rhythm consists of 9-10 Hz activity of moderate voltage (25-35 uV) seen predominantly in  posterior head regions, symmetric and reactive to eye opening and eye closing. Hyperventilation and photic stimulation were not performed.   IMPRESSION: This study is within normal limits.  No seizures or epileptiform discharges were seen throughout the recording. A normal interictal EEG does not exclude the diagnosis of epilepsy. Priyanka Annabelle Harman   CT ANGIO HEAD NECK W WO CM (CODE STROKE) Result Date: 04/16/2023 CLINICAL DATA:  Stroke follow-up EXAM: CT ANGIOGRAPHY HEAD AND NECK WITH AND WITHOUT CONTRAST TECHNIQUE: Multidetector CT imaging of the head and neck was performed using the standard protocol during bolus administration of intravenous contrast. Multiplanar CT image reconstructions and MIPs were obtained to evaluate the vascular anatomy. Carotid stenosis measurements (when applicable) are obtained utilizing NASCET criteria, using the distal internal carotid diameter as the denominator. RADIATION DOSE REDUCTION: This exam was performed according to the departmental dose-optimization program which includes automated exposure control, adjustment of the mA and/or kV according to patient size and/or use of iterative reconstruction technique. CONTRAST:  75mL OMNIPAQUE IOHEXOL 350 MG/ML SOLN COMPARISON:  None Available. FINDINGS: CTA NECK FINDINGS Skeleton: No acute abnormality or high grade bony spinal canal stenosis. Other neck: Normal pharynx, larynx and major salivary glands. No cervical lymphadenopathy. Unremarkable thyroid gland. Upper chest: No pneumothorax or pleural effusion. No nodules or masses. Aortic arch: There is calcific atherosclerosis of the aortic arch. Conventional 3 vessel aortic branching pattern. RIGHT carotid system: Normal without aneurysm, dissection or stenosis. 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 No intraluminal thrombus or other filling defect. No stenosis. Vertebral arteries: Left dominant  configuration. There is no dissection, occlusion or flow-limiting stenosis to the skull base (V1-V3 segments). CTA HEAD FINDINGS POSTERIOR CIRCULATION: Vertebral arteries are normal. No proximal occlusion of the anterior or inferior cerebellar arteries. Basilar artery is normal. Superior cerebellar arteries are normal. Posterior cerebral arteries are normal. ANTERIOR CIRCULATION: Intracranial internal carotid arteries are normal. Anterior cerebral arteries are normal. Middle cerebral arteries are normal. Venous sinuses: As permitted by contrast timing, patent. Anatomic variants: None Review of the MIP images confirms the above findings. IMPRESSION: 1. 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 No intraluminal thrombus or other filling defect. 2. No intracranial large vessel occlusion or high-grade stenosis. Electronically Signed   By: Deatra Robinson M.D.   On: 04/16/2023 18:13   CT HEAD CODE STROKE WO CONTRAST Result Date: 04/16/2023 CLINICAL DATA:  Code stroke.  Confusion EXAM: CT HEAD WITHOUT CONTRAST TECHNIQUE: Contiguous axial images were obtained from the base of the skull through the vertex without intravenous contrast. RADIATION DOSE REDUCTION: This exam was performed according to the departmental dose-optimization program which includes automated exposure control, adjustment of the mA and/or kV according to patient size and/or use of iterative reconstruction technique. COMPARISON:  05/05/2022 FINDINGS: Brain: There is no mass, hemorrhage or extra-axial collection. The size and configuration of the ventricles and extra-axial CSF spaces are normal. The brain parenchyma is normal, without evidence of acute or chronic infarction. Vascular: No abnormal hyperdensity of the major intracranial arteries or dural venous sinuses. No intracranial atherosclerosis. Skull: The visualized skull base, calvarium and extracranial soft tissues are normal.  Sinuses/Orbits: No fluid levels or advanced mucosal thickening of the visualized paranasal sinuses. No mastoid or middle ear effusion. The orbits are normal. ASPECTS [facility] Stroke Program Early CT Score) - Ganglionic level infarction (caudate, lentiform nuclei, internal capsule, insula, M1-M3 cortex): 7 - Supraganglionic infarction (M4-M6 cortex): 3 Total score (0-10 with 10 being normal): 10 IMPRESSION: 1. No acute intracranial abnormality. 2. ASPECTS is 10. Electronically Signed  By: Deatra Robinson M.D.   On: 04/16/2023 17:19     Assessment and Plan:   Bradycardia Near Syncopal Episode  - this am after getting blood draw, patient felt lightheaded and dizzy like he was going to pass out. denies any syncope, chest pain, and shortness of breath.  Denies any prior history of syncopal like symptoms.  Reports wearing a mazfit watch that has notified him in the past for low heart rates while he sleeping, as low as 31 bpm.  - Telemetry:Alternates between NSR and Sinus Bradycardia baseline artifact, no atrial or ventricular abnormalities  - Suspect 2/2 to vasovagal syncope, OSA or nocturia bradycardia.  - Low suspicion for AV block or sick sinus syndrome.  - Ordering TSH.  - Can consider outpatient monitoring  - Will continue to monitor during this admission  Code Stroke, Suspect TIA  Post TNK Hx stroke/TIA 06/30/2021 - Currently on ASA 81 mg - Managed by neurology - Avoid antiplatelets or anticoagulants - SCDs and Lovenox for DVT prophylaxis  HLD - Intolerable of Statins: Reported muscle aches while on lipitor and pravastatin w/ Coq10. - Will refer to outpatient lipid clinic   OSA with CPAP  - Did not wear CPAP last night since it was not at hospital - Now, he was CPAP and plans to continue tonight.   Former tobacco smoker - encouraged continuation of cessation    For questions or updates, please contact [facility] Please consult [email or web address] for contact info under     Signed, Basilio Cairo, PA-C  04/17/2023 3:38 PM   Patient seen and examined. Agree with assessment and plan.  Timothy Leblanc is a very pleasant 63 year old gentleman who works for [employer].  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  He was fully recovered and was doing well. Yesterday in the late afternoon he developed acute onset of confusion, had mild aphasia and without recollection.  [facility].  A code stroke was initiated and he received IV TNK and transferred to [facility].  Ultimately, he resolved symptomatology.  CTA of his head and neck was unchanged from previously and showed the approximate 6 mm dilatation at the distal left ICA just proximal to the skull base which had been present previously.  MRI of the brain did not show any acute intracranial abnormality with stable patchy chronic left MCA territory infarcts.  An echo Doppler study showed normal LV function and diastolic parameters.  There was trivial MR. ECG shows sinus rhythm at 72 with possible borderline LVH.  There were no significant ST-T changes or evidence for any heart block.  Apparently, the patient was sleeping last night when he was awakened this morning for a blood draw.  Shortly thereafter, heart rate decreased into the low 30s.  He was given atropine and dextrose with rapid recovery.  The patient has obstructive sleep apnea and was not wearing CPAP last evening in the hospital.  He also has a monitor watch which rarely has notified him of having slow heart rates while sleeping into the 30s.  He has been fairly consistent with his CPAP use.  I suspect his bradycardia was vasovagal mediated but it is possible he was sleeping without CPAP and potentially had some apnea leading to more pronounced bradycardia.  Presently, blood pressure and heart rate are  stable with pulse in the 70s.  There is no JVD.  Lungs are [place].  Rhythm is regular with no S3 or S4 gallop or significant murmur.  Abdomen nontender.  No edema.  At present recommend continue to monitor during admission.  Doubt high degree heart block.  Follow-up TSH.  Once discharged consider 2-week Zio patch monitor further assessment of potential nocturnal bradycardia arrhythmia.  Lennette Bihari, MD, [facility] 04/17/2023 5:55 PM

## 2023-04-17 NOTE — Progress Notes (Signed)
 PT Cancellation Note  Patient Details Name: Timothy Leblanc MRN: [id number] DOB: 1960-04-01   Cancelled Treatment:    Reason Eval/Treat Not Completed: Active bedrest order   Cristine Polio 04/17/2023, 6:38 AM Merryl Hacker, PT Acute Rehabilitation Services Office: [phone]

## 2023-04-17 NOTE — Plan of Care (Signed)

## 2023-04-17 NOTE — Plan of Care (Signed)

## 2023-04-18 DIAGNOSIS — G454 Transient global amnesia: Principal | ICD-10-CM

## 2023-04-18 DIAGNOSIS — R001 Bradycardia, unspecified: Secondary | ICD-10-CM | POA: Diagnosis not present

## 2023-04-18 LAB — TSH: TSH: 1.536 u[IU]/mL (ref 0.350–4.500)

## 2023-04-18 NOTE — Discharge Summary (Cosign Needed)
 Stroke Discharge Summary  Patient ID: Timothy Leblanc   MRN: [id number]      DOB: [birth year]  Date of Admission: 04/16/2023 Date of Discharge: 04/18/2023  Attending Physician:  Stroke, Md, MD Consultant(s):    None  Patient's PCP:  Ozella Rocks, MD  DISCHARGE PRIMARY DIAGNOSIS: Transient global amnesia  Patient Active Problem List   Diagnosis Date Noted   Degenerative disc disease, lumbar 06/06/2022   Degenerative cervical disc 06/06/2022   Interstitial pulmonary disease (HCC) 09/07/2021   Stroke (HCC) 06/30/2021   Stroke (cerebrum) (HCC) 06/30/2021   Acute ischemic stroke (HCC) 06/30/2021     Secondary Diagnoses: Hypertension Hyperlipidemia Bradycardia  Allergies as of 04/18/2023   No Known Allergies      Medication List     STOP taking these medications    diazepam 5 MG tablet Commonly known as: VALIUM   gabapentin 100 MG capsule Commonly known as: NEURONTIN   pravastatin 20 MG tablet Commonly known as: PRAVACHOL       TAKE these medications    acetaminophen 500 MG tablet Commonly known as: TYLENOL Take 500 mg by mouth every 6 (six) hours as needed.   ASPIRIN 81 PO Take by mouth.   cetirizine 10 MG tablet Commonly known as: ZYRTEC Take 10 mg by mouth daily as needed for allergies.   tamsulosin 0.4 MG Caps capsule Commonly known as: FLOMAX Take 0.4 mg by mouth daily.        LABORATORY STUDIES CBC    Component Value Date/Time   WBC 5.6 04/17/2023 0437   RBC 5.12 04/17/2023 0437   HGB 14.9 04/17/2023 0437   HCT 43.3 04/17/2023 0437   PLT 143 (L) 04/17/2023 0437   MCV 84.6 04/17/2023 0437   MCH 29.1 04/17/2023 0437   MCHC 34.4 04/17/2023 0437   RDW 13.2 04/17/2023 0437   LYMPHSABS 1.3 04/16/2023 1709   MONOABS 0.4 04/16/2023 1709   EOSABS 0.0 04/16/2023 1709   BASOSABS 0.0 04/16/2023 1709   CMP    Component Value Date/Time   NA 137 04/17/2023 0437   K 3.8 04/17/2023 0437   CL 104 04/17/2023 0437   CO2 24 04/17/2023  0437   GLUCOSE 109 (H) 04/17/2023 0437   BUN 8 04/17/2023 0437   CREATININE 1.00 04/17/2023 0437   CALCIUM 8.9 04/17/2023 0437   PROT 8.0 04/16/2023 1709   ALBUMIN 5.0 04/16/2023 1709   AST 17 04/16/2023 1709   ALT 24 04/16/2023 1709   ALKPHOS 84 04/16/2023 1709   BILITOT 0.9 04/16/2023 1709   GFRNONAA >60 04/17/2023 0437   COAGS Lab Results  Component Value Date   INR 1.0 04/16/2023   INR 1.0 06/30/2021   Lipid Panel    Component Value Date/Time   CHOL 179 04/17/2023 0228   CHOL 141 09/07/2021 1155   TRIG 111 04/17/2023 0228   HDL 50 04/17/2023 0228   HDL 67 09/07/2021 1155   CHOLHDL 3.6 04/17/2023 0228   VLDL 22 04/17/2023 0228   LDLCALC 107 (H) 04/17/2023 0228   LDLCALC 61 06/06/2022 1453   HgbA1C  Lab Results  Component Value Date   HGBA1C 5.1 04/17/2023   Urine Drug Screen negative Alcohol Level    Component Value Date/Time   [facility] <10 04/16/2023 1709     SIGNIFICANT DIAGNOSTIC STUDIES ECHOCARDIOGRAM COMPLETE Result Date: 04/17/2023    ECHOCARDIOGRAM REPORT   Patient Name:   Timothy Leblanc Date of Exam: 04/17/2023 Medical Rec #:  [id number]  Height:       69.0 in Accession #:    [id number]          Weight:       204.1 lb Date of Birth:  01/21/1961           BSA:          2.084 m Patient Age:    63 years            BP:           11/76 mmHg Patient Gender: M                   HR:           57 bpm. Exam Location:  Inpatient Procedure: 2D Echo, Cardiac Doppler and Color Doppler (Both Spectral and Color            Flow Doppler were utilized during procedure). Indications:    Stroke I63.9  History:        Patient has prior history of Echocardiogram examinations, most                 recent 07/01/2021.  Sonographer:    Darlys Gales Referring Phys: [phone] ERIC LINDZEN IMPRESSIONS  1. Left ventricular ejection fraction, by estimation, is 60 to 65%. The left ventricle has normal function. Left ventricular endocardial border not optimally defined to evaluate regional  wall motion. Left ventricular diastolic parameters were normal.  2. Right ventricular systolic function is normal. The right ventricular size is normal. Tricuspid regurgitation signal is inadequate for assessing PA pressure.  3. The mitral valve is grossly normal. Trivial mitral valve regurgitation. No evidence of mitral stenosis.  4. The aortic valve is grossly normal. Aortic valve regurgitation is not visualized. No aortic stenosis is present. Comparison(s): No significant change from prior study. FINDINGS  Left Ventricle: Left ventricular ejection fraction, by estimation, is 60 to 65%. The left ventricle has normal function. Left ventricular endocardial border not optimally defined to evaluate regional wall motion. The left ventricular internal cavity size was normal in size. There is no left ventricular hypertrophy. Left ventricular diastolic parameters were normal. Right Ventricle: The right ventricular size is normal. No increase in right ventricular wall thickness. Right ventricular systolic function is normal. Tricuspid regurgitation signal is inadequate for assessing PA pressure. Left Atrium: Left atrial size was normal in size. Right Atrium: Right atrial size was normal in size. Pericardium: There is no evidence of pericardial effusion. Presence of epicardial fat layer. Mitral Valve: The mitral valve is grossly normal. Trivial mitral valve regurgitation. No evidence of mitral valve stenosis. Tricuspid Valve: The tricuspid valve is grossly normal. Tricuspid valve regurgitation is trivial. No evidence of tricuspid stenosis. Aortic Valve: The aortic valve is grossly normal. Aortic valve regurgitation is not visualized. No aortic stenosis is present. Aortic valve mean gradient measures 3.0 mmHg. Aortic valve peak gradient measures 4.2 mmHg. Aortic valve area, by VTI measures 3.21 cm. Pulmonic Valve: The pulmonic valve was grossly normal. Pulmonic valve regurgitation is trivial. No evidence of pulmonic  stenosis. Aorta: The aortic root is normal in size and structure. Venous: The inferior vena cava was not well visualized. IAS/Shunts: The atrial septum is grossly normal.  LEFT VENTRICLE PLAX 2D LVIDd:         5.10 cm   Diastology LVIDs:         3.10 cm   LV e' medial:    8.70 cm/s LV PW:  0.80 cm   LV E/e' medial:  8.2 LV IVS:        0.80 cm   LV e' lateral:   7.94 cm/s LVOT diam:     2.00 cm   LV E/e' lateral: 9.0 LV SV:         61 LV SV Index:   29 LVOT Area:     3.14 cm  RIGHT VENTRICLE RV S prime:     15.90 cm/s TAPSE (M-mode): 1.9 cm LEFT ATRIUM             Index LA Vol (A2C):   27.4 ml 13.15 ml/m LA Vol (A4C):   33.5 ml 16.07 ml/m LA Biplane Vol: 32.3 ml 15.50 ml/m  AORTIC VALVE AV Area (Vmax):    3.08 cm AV Area (Vmean):   2.68 cm AV Area (VTI):     3.21 cm AV Vmax:           103.00 cm/s AV Vmean:          83.200 cm/s AV VTI:            0.190 m AV Peak Grad:      4.2 mmHg AV Mean Grad:      3.0 mmHg LVOT Vmax:         101.00 cm/s LVOT Vmean:        71.100 cm/s LVOT VTI:          0.194 m LVOT/AV VTI ratio: 1.02  AORTA Ao Root diam: 3.30 cm MITRAL VALVE MV Area (PHT): 3.46 cm    SHUNTS MV Decel Time: 219 msec    Systemic VTI:  0.19 m MV E velocity: 71.30 cm/s  Systemic Diam: 2.00 cm MV A velocity: 58.20 cm/s MV E/A ratio:  1.23 Lennie Odor MD Electronically signed by Lennie Odor MD Signature Date/Time: 04/17/2023/1:41:56 PM    Final    MR BRAIN WO CONTRAST Result Date: 04/17/2023 CLINICAL DATA:  63 year old male code stroke presentation yesterday. Neurologic deficit. EXAM: MRI HEAD WITHOUT CONTRAST TECHNIQUE: Multiplanar, multiecho pulse sequences of the brain and surrounding structures were obtained without intravenous contrast. COMPARISON:  CT head, CTA head and neck yesterday. Previous brain MRI 08/04/2022. FINDINGS: Brain: No restricted diffusion to suggest acute infarction. No midline shift, mass effect, evidence of mass lesion, ventriculomegaly, extra-axial collection or acute  intracranial hemorrhage. Cervicomedullary junction and pituitary are within normal limits. Cerebral volume appears stable since last year, within normal limits for age. Patchy cortical (series 4, image 14), insula (image 12), and regional and white matter T2 and FLAIR hyperintense encephalomalacia peers stable. Elsewhere gray and white matter signal appears normal for age. No chronic cerebral blood products identified on SWI. Deep gray nuclei, brainstem and cerebellum remain normal for age. Vascular: Major intracranial vascular flow voids are stable since last year. Skull and upper cervical spine: Stable, negative. Sinuses/Orbits: Stable, negative. Other: Mastoids remain Foulkes. Grossly normal visible internal auditory structures. IMPRESSION: Stable patchy chronic Left MCA territory infarcts. No acute intracranial abnormality. Electronically Signed   By: Odessa Fleming M.D.   On: 04/17/2023 12:54   EEG adult Result Date: 04/17/2023 Charlsie Quest, MD     04/17/2023 12:26 PM Patient Name: JAMAL PAVON MRN: [id number] Epilepsy Attending: Charlsie Quest Referring Physician/Provider: Caryl Pina, MD Date: 04/17/2023 Duration: 22.53 mins Patient history: 63yo M with ams. EEG to evaluate for seizure Level of alertness: Awake AEDs during EEG study: None Technical aspects: This EEG study was done with scalp electrodes positioned according  to the 10-20 International system of electrode placement. Electrical activity was reviewed with band pass filter of 1-70Hz , sensitivity of 7 uV/mm, display speed of 62mm/sec with a 60Hz  notched filter applied as appropriate. EEG data were recorded continuously and digitally stored.  Video monitoring was available and reviewed as appropriate. Description: The posterior dominant rhythm consists of 9-10 Hz activity of moderate voltage (25-35 uV) seen predominantly in posterior head regions, symmetric and reactive to eye opening and eye closing. Hyperventilation and photic stimulation were  not performed.   IMPRESSION: This study is within normal limits. No seizures or epileptiform discharges were seen throughout the recording. A normal interictal EEG does not exclude the diagnosis of epilepsy. Priyanka Annabelle Harman   CT ANGIO HEAD NECK W WO CM (CODE STROKE) Result Date: 04/16/2023 CLINICAL DATA:  Stroke follow-up EXAM: CT ANGIOGRAPHY HEAD AND NECK WITH AND WITHOUT CONTRAST TECHNIQUE: Multidetector CT imaging of the head and neck was performed using the standard protocol during bolus administration of intravenous contrast. Multiplanar CT image reconstructions and MIPs were obtained to evaluate the vascular anatomy. Carotid stenosis measurements (when applicable) are obtained utilizing NASCET criteria, using the distal internal carotid diameter as the denominator. RADIATION DOSE REDUCTION: This exam was performed according to the departmental dose-optimization program which includes automated exposure control, adjustment of the mA and/or kV according to patient size and/or use of iterative reconstruction technique. CONTRAST:  75mL OMNIPAQUE IOHEXOL 350 MG/ML SOLN COMPARISON:  None Available. FINDINGS: CTA NECK FINDINGS Skeleton: No acute abnormality or high grade bony spinal canal stenosis. Other neck: Normal pharynx, larynx and major salivary glands. No cervical lymphadenopathy. Unremarkable thyroid gland. Upper chest: No pneumothorax or pleural effusion. No nodules or masses. Aortic arch: There is calcific atherosclerosis of the aortic arch. Conventional 3 vessel aortic branching pattern. RIGHT carotid system: Normal without aneurysm, dissection or stenosis. 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 No intraluminal thrombus or other filling defect. No stenosis. Vertebral arteries: Left dominant configuration. There is no dissection, occlusion or flow-limiting stenosis to the skull base (V1-V3 segments). CTA HEAD  FINDINGS POSTERIOR CIRCULATION: Vertebral arteries are normal. No proximal occlusion of the anterior or inferior cerebellar arteries. Basilar artery is normal. Superior cerebellar arteries are normal. Posterior cerebral arteries are normal. ANTERIOR CIRCULATION: Intracranial internal carotid arteries are normal. Anterior cerebral arteries are normal. Middle cerebral arteries are normal. Venous sinuses: As permitted by contrast timing, patent. Anatomic variants: None Review of the MIP images confirms the above findings. IMPRESSION: 1. 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 No intraluminal thrombus or other filling defect. 2. No intracranial large vessel occlusion or high-grade stenosis. Electronically Signed   By: Deatra Robinson M.D.   On: 04/16/2023 18:13   CT HEAD CODE STROKE WO CONTRAST Result Date: 04/16/2023 CLINICAL DATA:  Code stroke.  Confusion EXAM: CT HEAD WITHOUT CONTRAST TECHNIQUE: Contiguous axial images were obtained from the base of the skull through the vertex without intravenous contrast. RADIATION DOSE REDUCTION: This exam was performed according to the departmental dose-optimization program which includes automated exposure control, adjustment of the mA and/or kV according to patient size and/or use of iterative reconstruction technique. COMPARISON:  05/05/2022 FINDINGS: Brain: There is no mass, hemorrhage or extra-axial collection. The size and configuration of the ventricles and extra-axial CSF spaces are normal. The brain parenchyma is normal, without evidence of acute or chronic infarction.  Vascular: No abnormal hyperdensity of the major intracranial arteries or dural venous sinuses. No intracranial atherosclerosis. Skull: The visualized skull base, calvarium and extracranial soft tissues are normal. Sinuses/Orbits: No fluid levels or advanced mucosal thickening of the visualized paranasal sinuses. No mastoid or middle ear  effusion. The orbits are normal. ASPECTS [facility] Stroke Program Early CT Score) - Ganglionic level infarction (caudate, lentiform nuclei, internal capsule, insula, M1-M3 cortex): 7 - Supraganglionic infarction (M4-M6 cortex): 3 Total score (0-10 with 10 being normal): 10 IMPRESSION: 1. No acute intracranial abnormality. 2. ASPECTS is 10. Electronically Signed   By: Deatra Robinson M.D.   On: 04/16/2023 17:19       HISTORY OF PRESENT ILLNESS 63 y.o. 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  HOSPITAL COURSE Patient was given TNK to treat possible stroke, but MRI was negative for acute infarct.  Symptoms were thought to be due to transient global amnesia.  Patient was monitored closely in the ICU.  He did have some episodes of bradycardia, once with a lab draw and also at night while he was sleeping, and he was evaluated by cardiology.  Bradycardia was thought to be due to patient not having access to his CPAP while in the hospital.  Patient is now stable and ready for discharge.  TGA, patient and family description more consistent with TGA Patient kept asking same question even given answered prior, lasted about 4 hours. CT no acute abnormality CT head and neck unchanged fusiform dilatation of approximately 6 mm at the distal left ICA just proximal to the skull base.  No thrombus or feeding defect. MRI no acute infarct.  Stable chronic left MCA infarcts 2D Echo EF 60 to 65% LDL 107 HgbA1c 5.1 UDS negative Lovenox for VTE prophylaxis aspirin 81 mg daily prior to admission, now on aspirin 81 mg daily.  Continue on discharge Ongoing aggressive stroke risk factor management Therapy recommendations: No follow-up needed Disposition: Home   Bradycardia patient had episode of bradycardia down to 30s while awake, feeling nauseous, pale, received  atropine and heart rate recovered and he feels much better.  No more recurrence since.  He stated that this happened a couple times in the past and night in bed. Currently heart rate 50s Cardiology consulted, felt that bradycardia was due to sleep apnea and absence of patient's CPAP.   History of stroke 06/2021 admitted for dizziness.  Found to have left MCA infarct.  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  Angiogram confirmed distal left ICA dissection, pseudoaneurysm, thrombus formation and severe 90% stenosis.  EF 60-65%, LDL 150, A1c 5.1.  Discharged on DAPT and Crestor 40. Follow with Dr. Pearlean Brownie at [facility], repeat CTA in 09/2021 and 01/02/2022 and 04/2022 showed resolved thrombosis within left ICA skull base level aneurysm/pseudoaneurysm, now stable 6 mm dilation.   BP management BP high on presentation Now stable Long term BP goal normotensive   Hyperlipidemia Home meds: Pravastatin 20 listed in medication list LDL 107, goal < 70 Patient stated that he is intolerant with even pravastatin 20 at home, has discussed with Dr. Pearlean Brownie and recommend to follow-up with PCP to consider PCSK9 inhibitor or Leqvio.   Other Stroke Risk Factors Obesity, Body mass index is 30.15 kg/m.    Other Active Problems BPH on Flomax  RN Pressure Injury  Documentation:     DISCHARGE EXAM  PHYSICAL EXAM General:  Alert, well-nourished, well-developed patient in no acute distress Psych:  Mood and affect appropriate for situation CV: Regular rate and rhythm on monitor Respiratory:  Regular, unlabored respirations on room air  NEURO:  Mental Status: AA&Ox3  Speech/Language: speech is without dysarthria or aphasia.  Naming, repetition, fluency, and comprehension intact.  Cranial Nerves:  II: PERRL. Visual fields full.  III, IV, VI: EOMI. Eyelids elevate symmetrically.  V: Sensation is intact to light touch and symmetrical to  face.  VII: Smile is symmetrical.  VIII: hearing intact to voice. IX, X: Phonation is normal.  WG:[id number] shrug 5/5. XII: tongue is midline without fasciculations. Motor: 5/5 strength to all muscle groups tested.  Tone: is normal and bulk is normal Sensation- Intact to light touch bilaterally.  Coordination: FTN intact bilaterally Gait- deferred  1a Level of Conscious.: 0 1b LOC Questions: 0 1c LOC Commands: 0 2 Best Gaze: 0 3 Visual: 0 4 Facial Palsy: 0 5a Motor Arm - left: 0 5b Motor Arm - Right: 0 6a Motor Leg - Left: 0 6b Motor Leg - Right: 0 7 Limb Ataxia: 0 8 Sensory: 0 9 Best Language: 0 10 Dysarthria: 0 11 Extinct. and Inatten.: 0 TOTAL: 0   Discharge Diet       Diet   Diet heart healthy/carb modified Room service appropriate? Yes; Fluid consistency: Thin   liquids  DISCHARGE PLAN Disposition: Home aspirin 81 mg daily for secondary stroke prevention = Ongoing stroke risk factor control by Primary Care Physician at time of discharge Follow-up PCP Ozella Rocks, MD in 2 weeks.  Discuss cholesterol-lowering medications with PCP. Follow-up in [facility] in 8 weeks, office to schedule an appointment.   25 minutes were spent preparing discharge.  Indiah Heyden E Ernestina Columbia , MSN, AGACNP-BC [facility] See Amion for schedule and pager information 04/18/2023 1:40 PM

## 2023-04-18 NOTE — Discharge Instructions (Signed)
 Timothy Leblanc, you came to the hospital with an episode of confusion and difficulty speaking.  Your symptoms were thought to be due to a stroke, and you were given TNK to treat this.  However, your MRI shows no stroke.  Your symptoms were likely caused by transient global amnesia.  Please continue to take your aspirin daily and follow up with outpatient neurology.  Please also follow up with your primary care provider regarding options to lower your cholesterol.

## 2023-04-18 NOTE — Plan of Care (Signed)
  Problem: Coping: Goal: Level of anxiety will decrease Outcome: Progressing   Problem: Elimination: Goal: Will not experience complications related to bowel motility Outcome: Progressing Goal: Will not experience complications related to urinary retention Outcome: Progressing   Problem: Pain Managment: Goal: General experience of comfort will improve and/or be controlled Outcome: Progressing   Problem: Safety: Goal: Ability to remain free from injury will improve Outcome: Progressing   Problem: Skin Integrity: Goal: Risk for impaired skin integrity will decrease Outcome: Progressing   Problem: Education: Goal: Knowledge of disease or condition will improve Outcome: Progressing Goal: Knowledge of secondary prevention will improve (MUST DOCUMENT ALL) Outcome: Progressing Goal: Knowledge of patient specific risk factors will improve (DELETE if not current risk factor) Outcome: Progressing   Problem: Ischemic Stroke/TIA Tissue Perfusion: Goal: Complications of ischemic stroke/TIA will be minimized Outcome: Progressing   Problem: Coping: Goal: Will verbalize positive feelings about self Outcome: Progressing Goal: Will identify appropriate support needs Outcome: Progressing   Problem: Self-Care: Goal: Ability to participate in self-care as condition permits will improve Outcome: Progressing Goal: Verbalization of feelings and concerns over difficulty with self-care will improve Outcome: Progressing Goal: Ability to communicate needs accurately will improve Outcome: Progressing   Problem: Nutrition: Goal: Risk of aspiration will decrease Outcome: Progressing Goal: Dietary intake will improve Outcome: Progressing

## 2023-04-18 NOTE — Progress Notes (Signed)
   04/18/23 1438  AVS Discharge Documentation  AVS Discharge Instructions Including Medications Provided to patient/caregiver  Name of Person Receiving AVS Discharge Instructions Including Medications Timothy Leblanc  Name of Clinician That Reviewed AVS Discharge Instructions Including Medications Drue Dun, RN

## 2023-04-18 NOTE — Progress Notes (Signed)
 SLP Cancellation Note  Patient Details Name: Timothy Leblanc MRN: [id number] DOB: 1960-08-19   Cancelled treatment:       Reason Eval/Treat Not Completed: SLP screened, no needs identified, will sign off No acute intracranial abnormality per MRI brain CT head, patient with chronic left MCA CVA, received SLP services in hospital and outpatient in 2023. Patient and spouse deny any new cognitive, speech or language impairment. Patient recalls this SLP evaluating him in 2023 and recalls specific errors he made in expressive language as well.     Angela Nevin, MA, CCC-SLP Speech Therapy

## 2023-04-18 NOTE — Evaluation (Signed)
 Physical Therapy Evaluation Patient Details Name: Timothy Leblanc MRN: [id number] DOB: 03/17/1960 Today's Date: 04/18/2023  History of Present Illness  Pt is a 63 y.o. male who presented to ED with acute onset confusion. Stroke workup initiated. TNK administered. CT/MRI negative for acute findings. Pt with episodes of bradycardia during hospitalization. PMH: L MCA stroke (2023)  Clinical Impression  PT eval complete. Pt is independent with all functional mobility. Strength symmetrical and intact. Balance intact. Educated on BEFAST. No further skilled PT intervention indicated. PT signing off.       If plan is discharge home, recommend the following:     Can travel by private vehicle        Equipment Recommendations None recommended by PT  Recommendations for Other Services       Functional Status Assessment Patient has not had a recent decline in their functional status     Precautions / Restrictions Precautions Precautions: None      Mobility  Bed Mobility Overal bed mobility: Independent                  Transfers Overall transfer level: Independent Equipment used: None                    Ambulation/Gait Ambulation/Gait assistance: Independent Gait Distance (Feet): 400 Feet Assistive device: None Gait Pattern/deviations: WFL(Within Functional Limits)   Gait velocity interpretation: >4.37 ft/sec, indicative of normal walking speed      Stairs            Wheelchair Mobility     Tilt Bed    Modified Rankin (Stroke Patients Only)       Balance Overall balance assessment: Independent                                           Pertinent Vitals/Pain Pain Assessment Pain Assessment: No/denies pain    Home Living Family/patient expects to be discharged to:: Private residence Living Arrangements: Spouse/significant other Available Help at Discharge: Family;Available 24 hours/day Type of Home: House Home  Access: Stairs to enter Entrance Stairs-Rails: Doctor, general practice of Steps: 5 Alternate Level Stairs-Number of Steps: 20 Home Layout: Two level Home Equipment: Cane - single point      Prior Function Prior Level of Function : Independent/Modified Independent;Working/employed;Driving             Mobility Comments: no AD       Extremity/Trunk Assessment   Upper Extremity Assessment Upper Extremity Assessment: Overall WFL for tasks assessed (symmetrical)    Lower Extremity Assessment Lower Extremity Assessment: Overall WFL for tasks assessed (symmetrical)    Cervical / Trunk Assessment Cervical / Trunk Assessment: Normal  Communication   Communication Communication: Impaired Factors Affecting Communication: Difficulty expressing self (mild from previous CVA)    Cognition Arousal: Alert Behavior During Therapy: WFL for tasks assessed/performed   PT - Cognitive impairments: No apparent impairments                                 Cueing       General Comments General comments (skin integrity, edema, etc.): VSS on RA. Educated on BEFAST.    Exercises     Assessment/Plan    PT Assessment Patient does not need any further PT services  PT Problem List  PT Treatment Interventions      PT Goals (Current goals can be found in the Care Plan section)  Acute Rehab PT Goals Patient Stated Goal: home today PT Goal Formulation: All assessment and education complete, DC therapy    Frequency       Co-evaluation               AM-PAC PT "6 Clicks" Mobility  Outcome Measure Help needed turning from your back to your side while in a flat bed without using bedrails?: None Help needed moving from lying on your back to sitting on the side of a flat bed without using bedrails?: None Help needed moving to and from a bed to a chair (including a wheelchair)?: None Help needed standing up from a chair using your arms (e.g.,  wheelchair or bedside chair)?: None Help needed to walk in hospital room?: None Help needed climbing 3-5 steps with a railing? : None 6 Click Score: 24    End of Session   Activity Tolerance: Patient tolerated treatment well Patient left: in bed;with family/visitor present   PT Visit Diagnosis: Other abnormalities of gait and mobility (R26.89)    Time: 2130-8657 PT Time Calculation (min) (ACUTE ONLY): 23 min   Charges:   PT Evaluation $PT Eval Low Complexity: 1 Low   PT General Charges $$ ACUTE PT VISIT: 1 Visit         Ferd Glassing., PT  Office # [phone]   Ilda Foil 04/18/2023, 11:24 AM

## 2023-04-18 NOTE — Progress Notes (Addendum)
 Patient Name: SLAYDEN MENNENGA Date of Encounter: 04/18/2023 [facility] Cardiologist: None   Interval Summary  .    Doing well without any significant complaints of chest pain or shortness of breath.  Heart rate still go low in the 30s and 40s while sleeping, still without CPAP, but brought it with him today.  Vital Signs .    Vitals:   04/18/23 0400 04/18/23 0532 04/18/23 0600 04/18/23 0750  BP: 114/72 (!) 111/101 108/81   Pulse: 74  61   Resp: 17  11   Temp:    98 F (36.7 C)  TempSrc:    Oral  SpO2: 90%  96%   Weight:        Intake/Output Summary (Last 24 hours) at 04/18/2023 0753 Last data filed at 04/18/2023 0600 Gross per 24 hour  Intake 770.5 ml  Output 1210 ml  Net -439.5 ml      04/16/2023    5:16 PM 12/13/2022   10:17 AM 10/05/2022   10:11 AM  Last 3 Weights  Weight (lbs) 204 lb 2.3 oz 210 lb 207 lb  Weight (kg) 92.6 kg 95.255 kg 93.895 kg      Telemetry/ECG    Sinus heart rates sometimes in the 30s and 40s but appropriately respond.  Vasovagal pattern- Personally Reviewed  CV Studies    Echocardiogram 04/17/2023 1. Left ventricular ejection fraction, by estimation, is 60 to 65%. The  left ventricle has normal function. Left ventricular endocardial border  not optimally defined to evaluate regional wall motion. Left ventricular  diastolic parameters were normal.   2. Right ventricular systolic function is normal. The right ventricular  size is normal. Tricuspid regurgitation signal is inadequate for assessing  PA pressure.   3. The mitral valve is grossly normal. Trivial mitral valve  regurgitation. No evidence of mitral stenosis.   4. The aortic valve is grossly normal. Aortic valve regurgitation is not  visualized. No aortic stenosis is present.   Comparison(s): No significant change from prior study.     Physical Exam .   GEN: No acute distress.   Neck: No JVD Cardiac: RRR, no murmurs, rubs, or gallops.  Respiratory: Badertscher to  auscultation bilaterally. GI: Soft, nontender, non-distended  MS: No edema  Patient Profile    CLARICE BONAVENTURE is a 63 y.o. male has hx of left MCA stroke, OSA on CPAP.  Cardiology asked to see due to bradycardia. Assessment & Plan .     Bradycardia Presyncope He has heart rates that go down to the 30s and 40s at night and has been without his CPAP machine.  These episodes are in a vasovagal pattern with longer P-P and R-R intervals, but respond appropriately when he is more awake.  Resting heart rates in the 60s, asymptomatic.  Also had dizziness/lightheadedness during blood draw, also likely vasovagal. No evidence of conduction disease or high degree AV blocks.  Normal TSH.  Echocardiogram normal. As this is highly suspicious for normal nocturnal bradycardia in the setting of OSA, do not feel that heart monitor is needed but will defer to MD.  TGA Previous history of stroke May 2025 Imaging without any acute findings.  On aspirin per neurology.  OSA on CPAP Has been without this during hospitalization.  Will ensure that he has this tonight.  Hyperlipidemia Reportedly statin intolerant.  Previous note indicates referral was sent to outpatient lipid clinic.  LDL 107.  Check in 6 to 8 weeks after starting therapy.  For  questions or updates, please contact [facility] Please consult [email or web address] for contact info under        Signed, Abagail Kitchens, PA-C    Personally seen and examined. Agree with above.  63 year old with asymptomatic bradycardia in the 30s and 40s while sleeping without CPAP.  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  This does not require pacemaker.  I would avoid AV nodal blocking agents such as metoprolol or diltiazem.  Normal thyroid function.  Normal ejection fraction on echocardiogram.  Normal electrolytes.  We will go ahead and sign off.  Please let us know if we can be of further  assistance.  Donato Schultz, MD

## 2023-04-18 NOTE — Progress Notes (Signed)
 OT Cancellation Note  Patient Details Name: Timothy Leblanc MRN: [id number] DOB: 08/13/1960   Cancelled Treatment:    Reason Eval/Treat Not Completed: OT screened, no needs identified, will sign off (per discussion with PT, pt with no acute OT needs will s/o. Please reconsult if there is a change in pt status)  Carver Fila, OTD, OTR/L SecureChat Preferred Acute Rehab [phone]   Carver Fila Koonce 04/18/2023, 11:18 AM

## 2023-04-18 NOTE — TOC Transition Note (Signed)
 Transition of Care [facility]) - Discharge Note   Patient Details  Name: Timothy Leblanc MRN: [id number] Date of Birth: 05-16-1960  [facility] CM/SW Contact:  Tom-Johnson, Hershal Coria, RN Phone Number: 04/18/2023, 1:59 PM   Clinical Narrative:     Patient is scheduled for discharge today.  Readmission Risk Assessment done. Outpatient f/u, hospital f/u and discharge instructions on AVS. No TOC needs or recommendations noted. Wife, [name] to transport at discharge.  No further TOC needs noted.        Final next level of care: Home/Self Care Barriers to Discharge: Barriers Resolved   Patient Goals and CMS Choice Patient states their goals for this hospitalization and ongoing recovery are:: To return home CMS Medicare.gov Compare Post Acute Care list provided to:: Patient Choice offered to / list presented to : NA      Discharge Placement                Patient to be transferred to facility by: Wife Name of family member notified: [name]    Discharge Plan and Services Additional resources added to the After Visit Summary for                  DME Arranged: N/A DME Agency: NA       HH Arranged: NA HH Agency: NA        Social Drivers of Health (SDOH) Interventions SDOH Screenings   Tobacco Use: Medium Risk (04/16/2023)     Readmission Risk Interventions    04/18/2023    1:57 PM  Readmission Risk Prevention Plan  Post Dischage Appt Complete  Medication Screening Complete  Transportation Screening Complete

## 2023-04-18 NOTE — Plan of Care (Signed)
  Problem: Education: Goal: Knowledge of General Education information will improve Description: Including pain rating scale, medication(s)/side effects and non-pharmacologic comfort measures Outcome: Adequate for Discharge   Problem: Health Behavior/Discharge Planning: Goal: Ability to manage health-related needs will improve Outcome: Adequate for Discharge   Problem: Clinical Measurements: Goal: Ability to maintain clinical measurements within normal limits will improve Outcome: Adequate for Discharge Goal: Will remain free from infection Outcome: Adequate for Discharge Goal: Diagnostic test results will improve Outcome: Adequate for Discharge Goal: Respiratory complications will improve Outcome: Adequate for Discharge Goal: Cardiovascular complication will be avoided Outcome: Adequate for Discharge   Problem: Activity: Goal: Risk for activity intolerance will decrease Outcome: Adequate for Discharge   Problem: Nutrition: Goal: Adequate nutrition will be maintained Outcome: Adequate for Discharge   Problem: Coping: Goal: Level of anxiety will decrease Outcome: Adequate for Discharge   Problem: Elimination: Goal: Will not experience complications related to bowel motility Outcome: Adequate for Discharge Goal: Will not experience complications related to urinary retention Outcome: Adequate for Discharge   Problem: Pain Managment: Goal: General experience of comfort will improve and/or be controlled Outcome: Adequate for Discharge   Problem: Safety: Goal: Ability to remain free from injury will improve Outcome: Adequate for Discharge   Problem: Skin Integrity: Goal: Risk for impaired skin integrity will decrease Outcome: Adequate for Discharge   Problem: Education: Goal: Knowledge of disease or condition will improve Outcome: Adequate for Discharge Goal: Knowledge of secondary prevention will improve (MUST DOCUMENT ALL) Outcome: Adequate for Discharge Goal:  Knowledge of patient specific risk factors will improve (DELETE if not current risk factor) Outcome: Adequate for Discharge   Problem: Ischemic Stroke/TIA Tissue Perfusion: Goal: Complications of ischemic stroke/TIA will be minimized Outcome: Adequate for Discharge   Problem: Coping: Goal: Will verbalize positive feelings about self Outcome: Adequate for Discharge Goal: Will identify appropriate support needs Outcome: Adequate for Discharge   Problem: Health Behavior/Discharge Planning: Goal: Ability to manage health-related needs will improve Outcome: Adequate for Discharge Goal: Goals will be collaboratively established with patient/family Outcome: Adequate for Discharge   Problem: Self-Care: Goal: Ability to participate in self-care as condition permits will improve Outcome: Adequate for Discharge Goal: Verbalization of feelings and concerns over difficulty with self-care will improve Outcome: Adequate for Discharge Goal: Ability to communicate needs accurately will improve Outcome: Adequate for Discharge   Problem: Nutrition: Goal: Risk of aspiration will decrease Outcome: Adequate for Discharge Goal: Dietary intake will improve Outcome: Adequate for Discharge

## 2023-04-19 NOTE — Discharge Summary (Incomplete)
 Stroke Discharge Summary  Patient ID: Timothy Leblanc   MRN: [id number]      DOB: 16-Sep-1960  Date of Admission: 04/16/2023 Date of Discharge: 04/18/2023  Attending Physician:  Stroke, Md, MD Consultant(s):    None  Patient's PCP:  Timothy Rocks, MD  DISCHARGE PRIMARY DIAGNOSIS:  Likely Transient global amnesia  Secondary Diagnoses: Hypertension Hyperlipidemia Bradycardia  Allergies as of 04/18/2023   No Known Allergies      Medication List     STOP taking these medications    diazepam 5 MG tablet Commonly known as: VALIUM   gabapentin 100 MG capsule Commonly known as: NEURONTIN   pravastatin 20 MG tablet Commonly known as: PRAVACHOL       TAKE these medications    acetaminophen 500 MG tablet Commonly known as: TYLENOL Take 500 mg by mouth every 6 (six) hours as needed.   ASPIRIN 81 PO Take by mouth.   cetirizine 10 MG tablet Commonly known as: ZYRTEC Take 10 mg by mouth daily as needed for allergies.   tamsulosin 0.4 MG Caps capsule Commonly known as: FLOMAX Take 0.4 mg by mouth daily.        LABORATORY STUDIES CBC    Component Value Date/Time   WBC 5.6 04/17/2023 0437   RBC 5.12 04/17/2023 0437   HGB 14.9 04/17/2023 0437   HCT 43.3 04/17/2023 0437   PLT 143 (L) 04/17/2023 0437   MCV 84.6 04/17/2023 0437   MCH 29.1 04/17/2023 0437   MCHC 34.4 04/17/2023 0437   RDW 13.2 04/17/2023 0437   LYMPHSABS 1.3 04/16/2023 1709   MONOABS 0.4 04/16/2023 1709   EOSABS 0.0 04/16/2023 1709   BASOSABS 0.0 04/16/2023 1709   CMP    Component Value Date/Time   NA 137 04/17/2023 0437   K 3.8 04/17/2023 0437   CL 104 04/17/2023 0437   CO2 24 04/17/2023 0437   GLUCOSE 109 (H) 04/17/2023 0437   BUN 8 04/17/2023 0437   CREATININE 1.00 04/17/2023 0437   CALCIUM 8.9 04/17/2023 0437   PROT 8.0 04/16/2023 1709   ALBUMIN 5.0 04/16/2023 1709   AST 17 04/16/2023 1709   ALT 24 04/16/2023 1709   ALKPHOS 84 04/16/2023 1709   BILITOT 0.9 04/16/2023  1709   GFRNONAA >60 04/17/2023 0437   COAGS Lab Results  Component Value Date   INR 1.0 04/16/2023   INR 1.0 06/30/2021   Lipid Panel    Component Value Date/Time   CHOL 179 04/17/2023 0228   CHOL 141 09/07/2021 1155   TRIG 111 04/17/2023 0228   HDL 50 04/17/2023 0228   HDL 67 09/07/2021 1155   CHOLHDL 3.6 04/17/2023 0228   VLDL 22 04/17/2023 0228   LDLCALC 107 (H) 04/17/2023 0228   LDLCALC 61 06/06/2022 1453   HgbA1C  Lab Results  Component Value Date   HGBA1C 5.1 04/17/2023   Urine Drug Screen negative Alcohol Level    Component Value Date/Time   ETH <10 04/16/2023 1709     SIGNIFICANT DIAGNOSTIC STUDIES ECHOCARDIOGRAM COMPLETE Result Date: 04/17/2023    ECHOCARDIOGRAM REPORT   Patient Name:   Timothy Leblanc Date of Exam: 04/17/2023 Medical Rec #:  [id number]           Height:       69.0 in Accession #:    [id number]          Weight:       204.1 lb Date of Birth:  [birth year]  BSA:          2.084 m Patient Age:    62 years            BP:           11/76 mmHg Patient Gender: M                   HR:           57 bpm. Exam Location:  Inpatient Procedure: 2D Echo, Cardiac Doppler and Color Doppler (Both Spectral and Color            Flow Doppler were utilized during procedure). Indications:    Stroke I63.9  History:        Patient has prior history of Echocardiogram examinations, most                 recent 07/01/2021.  Sonographer:    Darlys Gales Referring Phys: [phone] ERIC LINDZEN IMPRESSIONS  1. Left ventricular ejection fraction, by estimation, is 60 to 65%. The left ventricle has normal function. Left ventricular endocardial border not optimally defined to evaluate regional wall motion. Left ventricular diastolic parameters were normal.  2. Right ventricular systolic function is normal. The right ventricular size is normal. Tricuspid regurgitation signal is inadequate for assessing PA pressure.  3. The mitral valve is grossly normal. Trivial mitral valve regurgitation.  No evidence of mitral stenosis.  4. The aortic valve is grossly normal. Aortic valve regurgitation is not visualized. No aortic stenosis is present. Comparison(s): No significant change from prior study. FINDINGS  Left Ventricle: Left ventricular ejection fraction, by estimation, is 60 to 65%. The left ventricle has normal function. Left ventricular endocardial border not optimally defined to evaluate regional wall motion. The left ventricular internal cavity size was normal in size. There is no left ventricular hypertrophy. Left ventricular diastolic parameters were normal. Right Ventricle: The right ventricular size is normal. No increase in right ventricular wall thickness. Right ventricular systolic function is normal. Tricuspid regurgitation signal is inadequate for assessing PA pressure. Left Atrium: Left atrial size was normal in size. Right Atrium: Right atrial size was normal in size. Pericardium: There is no evidence of pericardial effusion. Presence of epicardial fat layer. Mitral Valve: The mitral valve is grossly normal. Trivial mitral valve regurgitation. No evidence of mitral valve stenosis. Tricuspid Valve: The tricuspid valve is grossly normal. Tricuspid valve regurgitation is trivial. No evidence of tricuspid stenosis. Aortic Valve: The aortic valve is grossly normal. Aortic valve regurgitation is not visualized. No aortic stenosis is present. Aortic valve mean gradient measures 3.0 mmHg. Aortic valve peak gradient measures 4.2 mmHg. Aortic valve area, by VTI measures 3.21 cm. Pulmonic Valve: The pulmonic valve was grossly normal. Pulmonic valve regurgitation is trivial. No evidence of pulmonic stenosis. Aorta: The aortic root is normal in size and structure. Venous: The inferior vena cava was not well visualized. IAS/Shunts: The atrial septum is grossly normal.  LEFT VENTRICLE PLAX 2D LVIDd:         5.10 cm   Diastology LVIDs:         3.10 cm   LV e' medial:    8.70 cm/s LV PW:         0.80 cm    LV E/e' medial:  8.2 LV IVS:        0.80 cm   LV e' lateral:   7.94 cm/s LVOT diam:     2.00 cm   LV E/e' lateral: 9.0 LV SV:  61 LV SV Index:   29 LVOT Area:     3.14 cm  RIGHT VENTRICLE RV S prime:     15.90 cm/s TAPSE (M-mode): 1.9 cm LEFT ATRIUM             Index LA Vol (A2C):   27.4 ml 13.15 ml/m LA Vol (A4C):   33.5 ml 16.07 ml/m LA Biplane Vol: 32.3 ml 15.50 ml/m  AORTIC VALVE AV Area (Vmax):    3.08 cm AV Area (Vmean):   2.68 cm AV Area (VTI):     3.21 cm AV Vmax:           103.00 cm/s AV Vmean:          83.200 cm/s AV VTI:            0.190 m AV Peak Grad:      4.2 mmHg AV Mean Grad:      3.0 mmHg LVOT Vmax:         101.00 cm/s LVOT Vmean:        71.100 cm/s LVOT VTI:          0.194 m LVOT/AV VTI ratio: 1.02  AORTA Ao Root diam: 3.30 cm MITRAL VALVE MV Area (PHT): 3.46 cm    SHUNTS MV Decel Time: 219 msec    Systemic VTI:  0.19 m MV E velocity: 71.30 cm/s  Systemic Diam: 2.00 cm MV A velocity: 58.20 cm/s MV E/A ratio:  1.23 Lennie Odor MD Electronically signed by Lennie Odor MD Signature Date/Time: 04/17/2023/1:41:56 PM    Final    MR BRAIN WO CONTRAST Result Date: 04/17/2023 CLINICAL DATA:  63 year old male code stroke presentation yesterday. Neurologic deficit. EXAM: MRI HEAD WITHOUT CONTRAST TECHNIQUE: Multiplanar, multiecho pulse sequences of the brain and surrounding structures were obtained without intravenous contrast. COMPARISON:  CT head, CTA head and neck yesterday. Previous brain MRI 08/04/2022. FINDINGS: Brain: No restricted diffusion to suggest acute infarction. No midline shift, mass effect, evidence of mass lesion, ventriculomegaly, extra-axial collection or acute intracranial hemorrhage. Cervicomedullary junction and pituitary are within normal limits. Cerebral volume appears stable since last year, within normal limits for age. Patchy cortical (series 4, image 14), insula (image 12), and regional and white matter T2 and FLAIR hyperintense encephalomalacia peers stable.  Elsewhere gray and white matter signal appears normal for age. No chronic cerebral blood products identified on SWI. Deep gray nuclei, brainstem and cerebellum remain normal for age. Vascular: Major intracranial vascular flow voids are stable since last year. Skull and upper cervical spine: Stable, negative. Sinuses/Orbits: Stable, negative. Other: Mastoids remain [place]. Grossly normal visible internal auditory structures. IMPRESSION: Stable patchy chronic Left MCA territory infarcts. No acute intracranial abnormality. Electronically Signed   By: Odessa Fleming M.D.   On: 04/17/2023 12:54   EEG adult Result Date: 04/17/2023 Charlsie Quest, MD     04/17/2023 12:26 PM Patient Name: JAYCEN VERCHER MRN: [id number] Epilepsy Attending: Charlsie Quest Referring Physician/Provider: Caryl Pina, MD Date: 04/17/2023 Duration: 22.53 mins Patient history: 63yo M with ams. EEG to evaluate for seizure Level of alertness: Awake AEDs during EEG study: None Technical aspects: This EEG study was done with scalp electrodes positioned according to the 10-20 International system of electrode placement. Electrical activity was reviewed with band pass filter of 1-70Hz , sensitivity of 7 uV/mm, display speed of 26mm/sec with a 60Hz  notched filter applied as appropriate. EEG data were recorded continuously and digitally stored.  Video monitoring was available and reviewed as appropriate. Description: The  posterior dominant rhythm consists of 9-10 Hz activity of moderate voltage (25-35 uV) seen predominantly in posterior head regions, symmetric and reactive to eye opening and eye closing. Hyperventilation and photic stimulation were not performed.   IMPRESSION: This study is within normal limits. No seizures or epileptiform discharges were seen throughout the recording. A normal interictal EEG does not exclude the diagnosis of epilepsy. Priyanka Annabelle Harman   CT ANGIO HEAD NECK W WO CM (CODE STROKE) Result Date: 04/16/2023 CLINICAL DATA:   Stroke follow-up EXAM: CT ANGIOGRAPHY HEAD AND NECK WITH AND WITHOUT CONTRAST TECHNIQUE: Multidetector CT imaging of the head and neck was performed using the standard protocol during bolus administration of intravenous contrast. Multiplanar CT image reconstructions and MIPs were obtained to evaluate the vascular anatomy. Carotid stenosis measurements (when applicable) are obtained utilizing NASCET criteria, using the distal internal carotid diameter as the denominator. RADIATION DOSE REDUCTION: This exam was performed according to the departmental dose-optimization program which includes automated exposure control, adjustment of the mA and/or kV according to patient size and/or use of iterative reconstruction technique. CONTRAST:  75mL OMNIPAQUE IOHEXOL 350 MG/ML SOLN COMPARISON:  None Available. FINDINGS: CTA NECK FINDINGS Skeleton: No acute abnormality or high grade bony spinal canal stenosis. Other neck: Normal pharynx, larynx and major salivary glands. No cervical lymphadenopathy. Unremarkable thyroid gland. Upper chest: No pneumothorax or pleural effusion. No nodules or masses. Aortic arch: There is calcific atherosclerosis of the aortic arch. Conventional 3 vessel aortic branching pattern. RIGHT carotid system: Normal without aneurysm, dissection or stenosis. 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 No intraluminal thrombus or other filling defect. No stenosis. Vertebral arteries: Left dominant configuration. There is no dissection, occlusion or flow-limiting stenosis to the skull base (V1-V3 segments). CTA HEAD FINDINGS POSTERIOR CIRCULATION: Vertebral arteries are normal. No proximal occlusion of the anterior or inferior cerebellar arteries. Basilar artery is normal. Superior cerebellar arteries are normal. Posterior cerebral arteries are normal. ANTERIOR CIRCULATION: Intracranial internal carotid arteries are normal.  Anterior cerebral arteries are normal. Middle cerebral arteries are normal. Venous sinuses: As permitted by contrast timing, patent. Anatomic variants: None Review of the MIP images confirms the above findings. IMPRESSION: 1. 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 No intraluminal thrombus or other filling defect. 2. No intracranial large vessel occlusion or high-grade stenosis. Electronically Signed   By: Deatra Robinson M.D.   On: 04/16/2023 18:13   CT HEAD CODE STROKE WO CONTRAST Result Date: 04/16/2023 CLINICAL DATA:  Code stroke.  Confusion EXAM: CT HEAD WITHOUT CONTRAST TECHNIQUE: Contiguous axial images were obtained from the base of the skull through the vertex without intravenous contrast. RADIATION DOSE REDUCTION: This exam was performed according to the departmental dose-optimization program which includes automated exposure control, adjustment of the mA and/or kV according to patient size and/or use of iterative reconstruction technique. COMPARISON:  05/05/2022 FINDINGS: Brain: There is no mass, hemorrhage or extra-axial collection. The size and configuration of the ventricles and extra-axial CSF spaces are normal. The brain parenchyma is normal, without evidence of acute or chronic infarction. Vascular: No abnormal hyperdensity of the major intracranial arteries or dural venous sinuses. No intracranial atherosclerosis. Skull: The visualized skull base, calvarium and extracranial soft tissues are normal. Sinuses/Orbits: No fluid levels or advanced mucosal thickening of the visualized paranasal sinuses. No mastoid or middle ear effusion. The orbits are normal. ASPECTS [facility]  Stroke Program Early CT Score) - Ganglionic level infarction (caudate, lentiform nuclei, internal capsule, insula, M1-M3 cortex): 7 - Supraganglionic infarction (M4-M6 cortex): 3 Total score (0-10 with 10 being normal): 10 IMPRESSION: 1. No acute intracranial  abnormality. 2. ASPECTS is 10. Electronically Signed   By: Deatra Robinson M.D.   On: 04/16/2023 17:19       HISTORY OF PRESENT ILLNESS 63 y.o. 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  HOSPITAL COURSE Patient was given TNK to treat possible stroke, but MRI was negative for acute infarct.  Symptoms were thought to be due to transient global amnesia.  Patient was monitored closely in the ICU.  He did have some episodes of bradycardia, once with a lab draw and also at night while he was sleeping, and he was evaluated by cardiology.  Bradycardia was thought to be due to patient not having access to his CPAP while in the hospital.  Patient is now stable and ready for discharge.  TGA, patient and family description more consistent with TGA Patient kept asking same question even given answered prior, lasted about 4 hours. CT no acute abnormality CT head and neck unchanged fusiform dilatation of approximately 6 mm at the distal left ICA just proximal to the skull base.  No thrombus or feeding defect. MRI no acute infarct.  Stable chronic left MCA infarcts 2D Echo EF 60 to 65% LDL 107 HgbA1c 5.1 UDS negative Lovenox for VTE prophylaxis aspirin 81 mg daily prior to admission, now on aspirin 81 mg daily.  Continue on discharge Ongoing aggressive stroke risk factor management Therapy recommendations: No follow-up needed Disposition: Home   Bradycardia patient had episode of bradycardia down to 30s while awake, feeling nauseous, pale, received atropine and heart rate recovered and he feels much better.  No more recurrence since.  He stated that this happened a couple times in the past and night in bed. Currently heart rate 50s Cardiology consulted, felt that bradycardia was due to sleep apnea and absence of patient's CPAP.   History of  stroke 06/2021 admitted for dizziness.  Found to have left MCA infarct.  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  Angiogram confirmed distal left ICA dissection, pseudoaneurysm, thrombus formation and severe 90% stenosis.  EF 60-65%, LDL 150, A1c 5.1.  Discharged on DAPT and Crestor 40. Follow with Dr. Pearlean Brownie at [facility], repeat CTA in 09/2021 and 01/02/2022 and 04/2022 showed resolved thrombosis within left ICA skull base level aneurysm/pseudoaneurysm, now stable 6 mm dilation.   BP management BP high on presentation Now stable Long term BP goal normotensive   Hyperlipidemia Home meds: Pravastatin 20 listed in medication list LDL 107, goal < 70 Patient stated that he is intolerant with even pravastatin 20 at home, has discussed with Dr. Pearlean Brownie and recommend to follow-up with PCP to consider PCSK9 inhibitor or Leqvio.   Other Stroke Risk Factors Obesity, Body mass index is 30.15 kg/m.    Other Active Problems BPH on Flomax  RN Pressure Injury Documentation:     DISCHARGE EXAM  PHYSICAL EXAM General:  Alert, well-nourished, well-developed patient in no acute distress Psych:  Mood and affect appropriate for situation CV: Regular rate and rhythm on monitor Respiratory:  Regular, unlabored respirations on room air  NEURO:  Mental Status: AA&Ox3  Speech/Language: speech is  without dysarthria or aphasia.  Naming, repetition, fluency, and comprehension intact.  Cranial Nerves:  II: PERRL. Visual fields full.  III, IV, VI: EOMI. Eyelids elevate symmetrically.  V: Sensation is intact to light touch and symmetrical to face.  VII: Smile is symmetrical.  VIII: hearing intact to voice. IX, X: Phonation is normal.  MW:[id number] shrug 5/5. XII: tongue is midline without fasciculations. Motor: 5/5 strength to all muscle groups tested.  Tone: is normal and bulk is normal Sensation- Intact to light touch bilaterally.   Coordination: FTN intact bilaterally Gait- deferred  1a Level of Conscious.: 0 1b LOC Questions: 0 1c LOC Commands: 0 2 Best Gaze: 0 3 Visual: 0 4 Facial Palsy: 0 5a Motor Arm - left: 0 5b Motor Arm - Right: 0 6a Motor Leg - Left: 0 6b Motor Leg - Right: 0 7 Limb Ataxia: 0 8 Sensory: 0 9 Best Language: 0 10 Dysarthria: 0 11 Extinct. and Inatten.: 0 TOTAL: 0   Discharge Diet       Diet   Diet heart healthy/carb modified Room service appropriate? Yes; Fluid consistency: Thin   liquids  DISCHARGE PLAN Disposition: Home aspirin 81 mg daily for secondary stroke prevention = Ongoing stroke risk factor control by Primary Care Physician at time of discharge Follow-up PCP Timothy Rocks, MD in 2 weeks.  Discuss cholesterol-lowering medications with PCP. Follow-up in [facility] in 8 weeks, office to schedule an appointment.   25 minutes were spent preparing discharge.  Cortney E Ernestina Columbia , MSN, AGACNP-BC [facility] See Amion for schedule and pager information 04/18/2023 1:40 PM

## 2023-05-01 ENCOUNTER — Ambulatory Visit: Payer: 59 | Admitting: Neurology

## 2023-05-01 ENCOUNTER — Telehealth: Payer: Self-pay

## 2023-05-01 NOTE — Telephone Encounter (Signed)
 Received a clearance from [facility] surgical and laser [facility].  Planned procedure to have cataract with intraocular lens implantation of the left eye followed by the right eye.  Patient does not need to stop any medications.  Topical anesthesia with IV medication will be used.  Form given to MD for review and signature.

## 2023-05-14 IMAGING — MR MR HEAD W/O CM
8 of 10 series · 36 of 48 positions shown · non-contrast
Comparison: CT head and CTA head and neck yesterday.

CLINICAL DATA: 60-year-old male code stroke presentation yesterday.
Left MCA M2 occlusion.

EXAM:
MRI HEAD WITHOUT CONTRAST
TECHNIQUE: Multiplanar, multiecho pulse sequences of the brain and surrounding
structures were obtained without intravenous contrast.

[Series 3: DWI · axial · 3.0mm · 1.09mm/px · z∈[-47,+106]mm · 9 of 104 slices shown (1 of 4)]
[im 1/104]
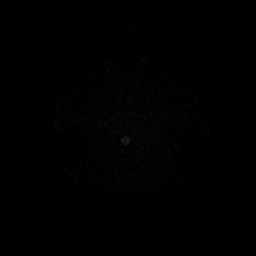
[im 13/104]
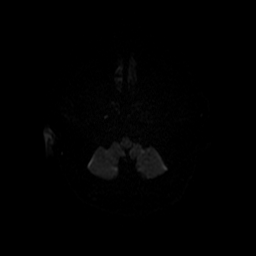
[im 26/104]
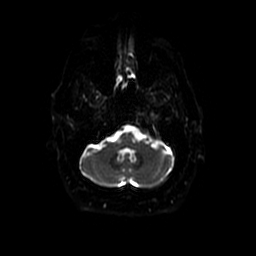
[im 39/104]
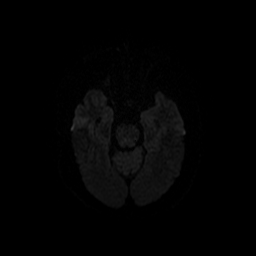
[im 52/104]
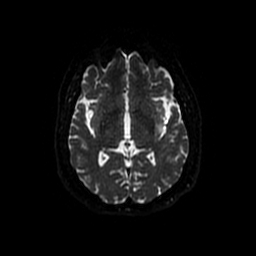
[im 65/104]
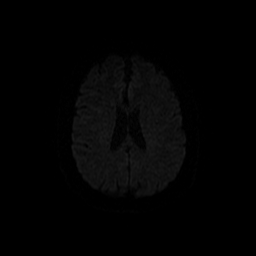
[im 78/104]
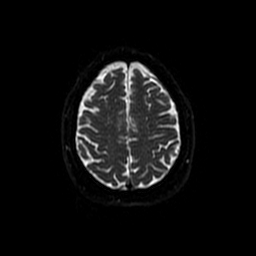
[im 91/104]
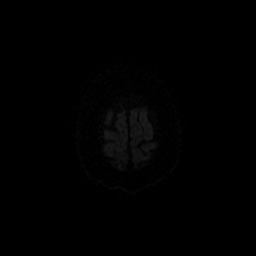
[im 104/104]
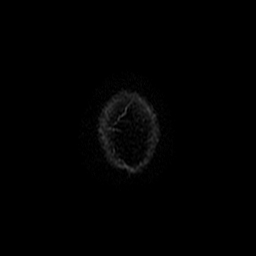

[Series 4: DWI · coronal · 5.0mm · 1.09mm/px · 7 of 74 slices shown (2 of 4)]
[im 1/74]
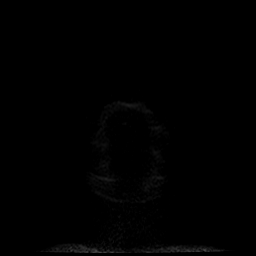
[im 13/74]
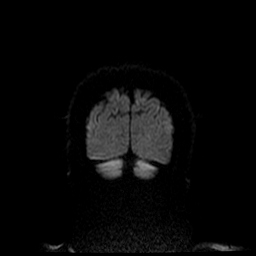
[im 25/74]
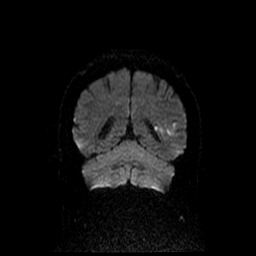
[im 37/74]
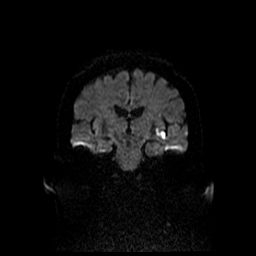
[im 49/74]
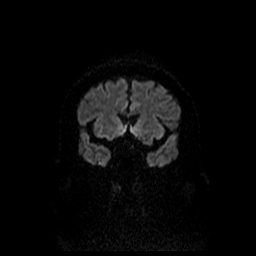
[im 61/74]
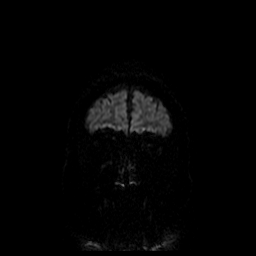
[im 74/74]
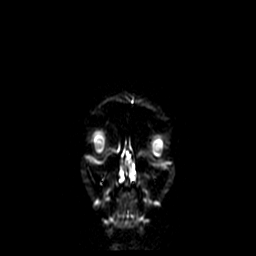

[Series 5: FLAIR · axial · 3.0mm · 0.43mm/px · z∈[-47,+102]mm · 3 of 26 slices shown]
[im 1/26]
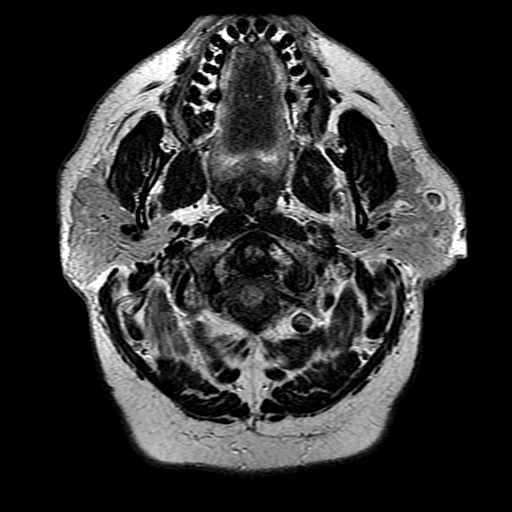
[im 13/26]
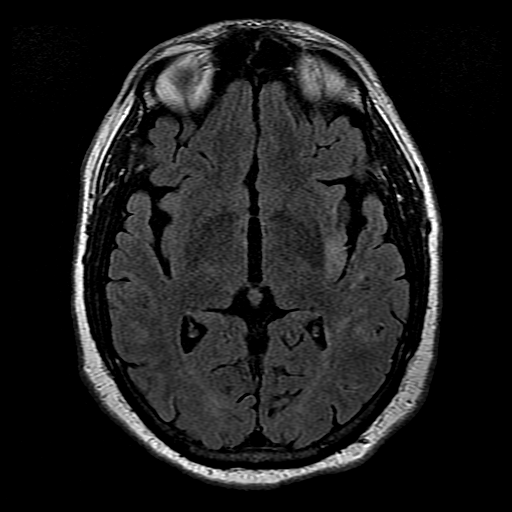
[im 26/26]
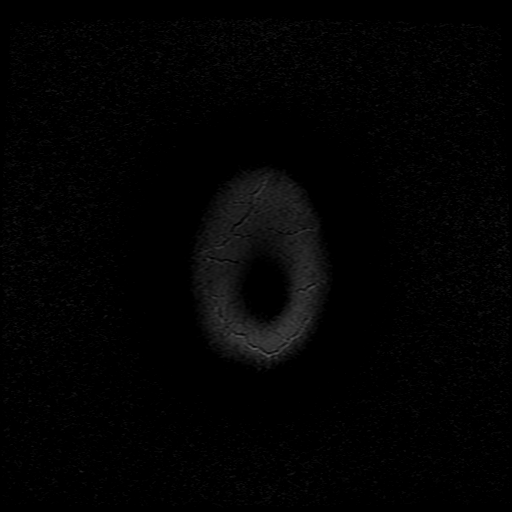

[Series 7: T1 · sagittal · 5.0mm · 0.47mm/px · 2 of 23 slices shown]
[im 1/23]
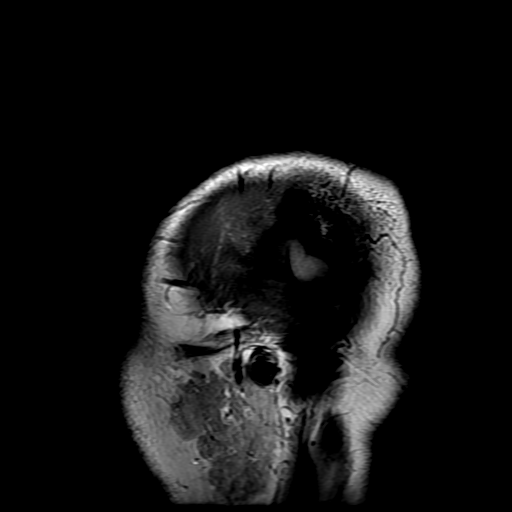
[im 23/23]
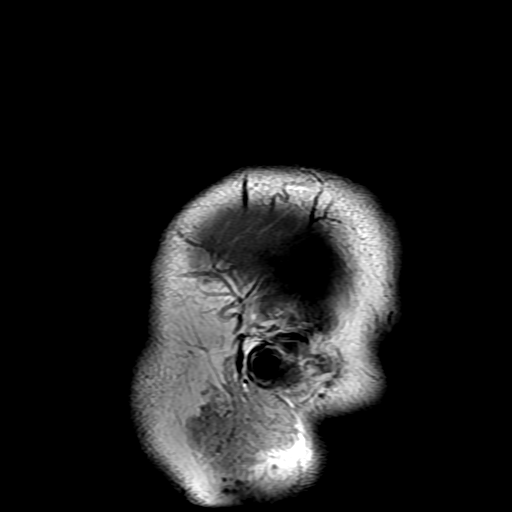

[Series 8: T2 · axial · 5.0mm · 0.43mm/px · z∈[-47,+102]mm · 3 of 26 slices shown (1 of 2)]
[im 1/26]
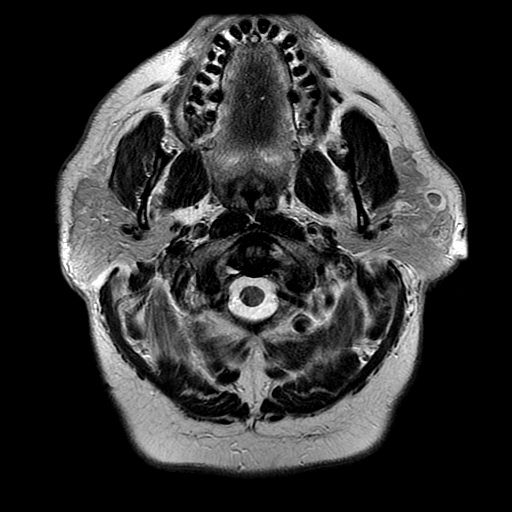
[im 13/26]
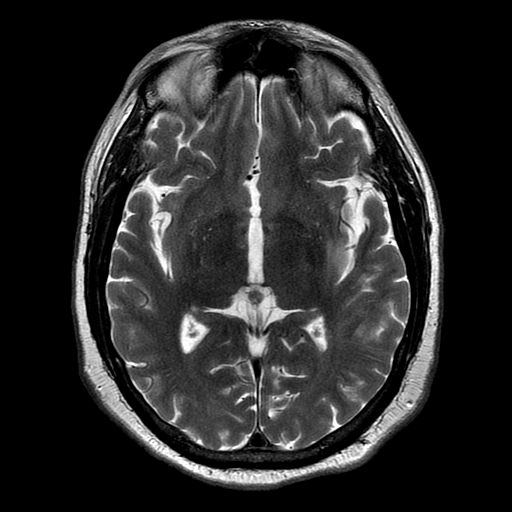
[im 26/26]
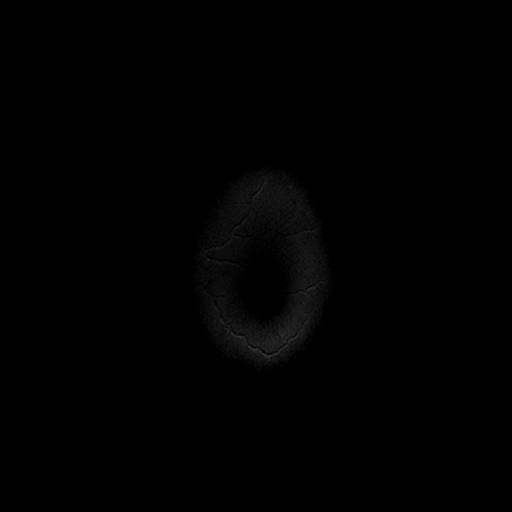

[Series 10: T2 · coronal · 5.0mm · 0.39mm/px · 3 of 28 slices shown (2 of 2)]
[im 1/28]
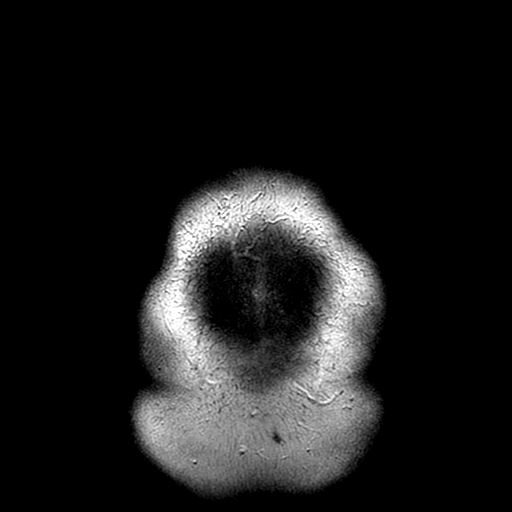
[im 14/28]
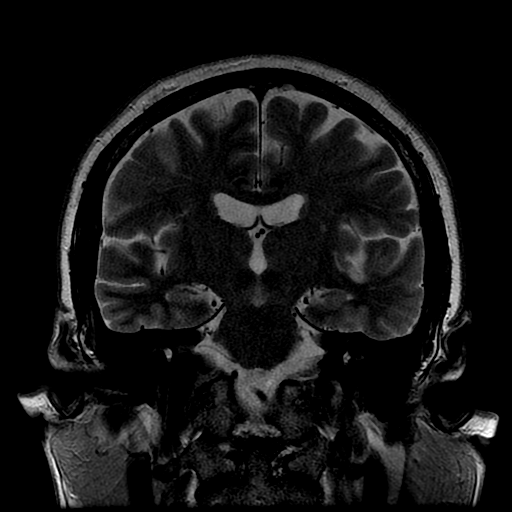
[im 28/28]
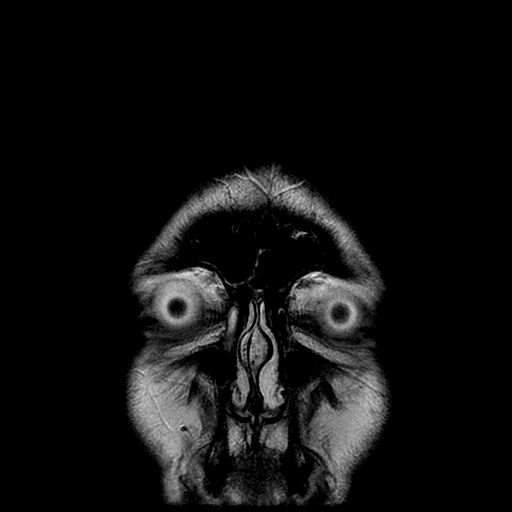

[Series 300: DWI · axial · 3.0mm · 1.09mm/px · z∈[-47,+103]mm · 5 of 51 slices shown (3 of 4)]
[im 1/51]
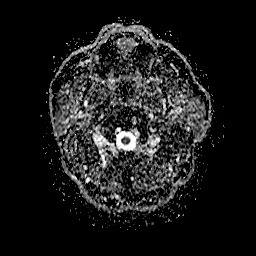
[im 13/51]
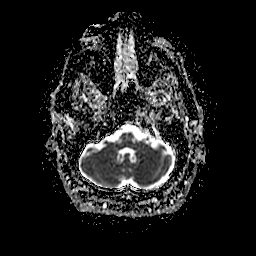
[im 26/51]
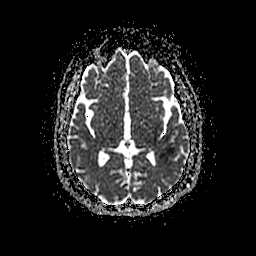
[im 38/51]
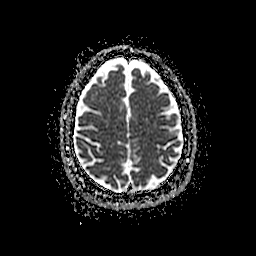
[im 51/51]
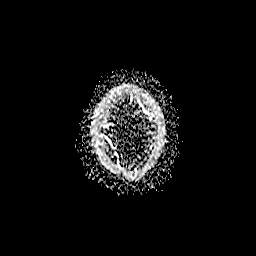

[Series 400: DWI · coronal · 5.0mm · 1.09mm/px · 4 of 37 slices shown (4 of 4)]
[im 1/37]
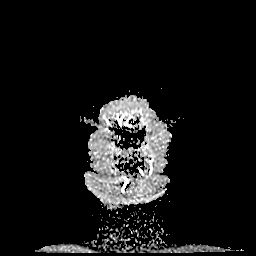
[im 13/37]
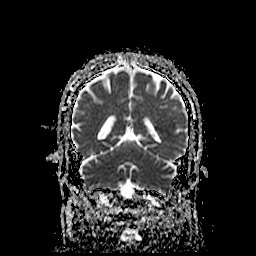
[im 25/37]
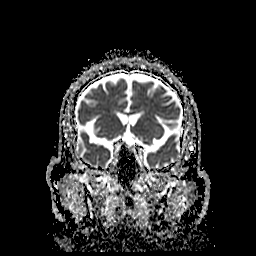
[im 37/37]
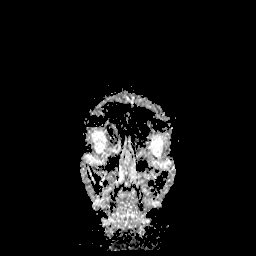

[36 of 48 positions shown; findings below may reference images not displayed]

FINDINGS: Brain: Patchy restricted diffusion in the middle and posterior left
MCA territory primarily affecting the posterior insula (series 3,
image 25), also the posterior left operculum and parietal lobe.
There is possible perirolandic cortex involvement of higher on
series 3, image 36. T2 and FLAIR hyperintensity in the affected
cortex, and T2 and FLAIR hyperintense left MCA branch vessels in
that region compatible with asymmetric flow. No hemorrhage or mass
effect.

No contralateral right hemisphere or posterior fossa restricted
diffusion. No midline shift, mass effect, evidence of mass lesion,
ventriculomegaly, extra-axial collection or acute intracranial
hemorrhage. Cervicomedullary junction and pituitary are within
normal limits. Outside of the acute ischemia gray and white matter
signal appears normal for age. No chronic cortical encephalomalacia
or chronic cerebral blood products identified.

Vascular: Major intracranial vascular flow voids are preserved
although the left ICA siphon appears asymmetric on series 8, image
27, likely related to asymmetric slow flow from partially thrombosed
cervical ICA pseudoaneurysm demonstrated yesterday.

Skull and upper cervical spine: Negative visible cervical spine.
Visualized bone marrow signal is within normal limits.

Sinuses/Orbits: Negative orbits. Trace paranasal sinus mucosal
thickening.

Other: Mastoids are clear. Grossly normal visible internal auditory
structures. Negative visible scalp and face.
IMPRESSION: 1. Patchy acute infarcts in the middle and posterior Left MCA
territory. No associated hemorrhage or mass effect.

2. Asymmetric MRI appearance of the Left ICA siphon likely
reflecting flow limiting stenosis from the partially thrombosed
cervical ICA pseudoaneurysm demonstrated by CTA.

3. But elsewhere normal for age noncontrast MRI appearance of the
Brain.

## 2023-07-30 ENCOUNTER — Telehealth: Payer: Self-pay

## 2023-07-30 NOTE — Patient Outreach (Signed)
 Telephone outreach to patient's wife to obtain mRS was successfully completed. MRS= 0  K[facility] Health Care Management Assistant  Direct Dial: [phone]  Fax: [phone] Website: [email or web address]

## 2023-08-10 ENCOUNTER — Encounter (HOSPITAL_COMMUNITY): Payer: Self-pay | Admitting: Interventional Radiology

## 2023-11-07 ENCOUNTER — Encounter: Payer: Self-pay | Admitting: Gastroenterology

## 2023-12-07 ENCOUNTER — Ambulatory Visit

## 2023-12-07 NOTE — Progress Notes (Unsigned)
 PV cancelled. Patient would like to proceed with cologuard to determine if he needs to go through with the actual colonoscopy. Pts last colonoscopy in 2015 resulted in diverticulitis and would like to avoid this reoccurring. Per pt no polpys or cancers on last exam and he does not have any GI issues. Pt will complete cologuard thru his PCP office and r/s apt if test returns positive.

## 2023-12-21 ENCOUNTER — Encounter: Admitting: Gastroenterology

## 2024-01-17 ENCOUNTER — Other Ambulatory Visit: Payer: Self-pay | Admitting: Family Medicine

## 2024-01-17 DIAGNOSIS — G4452 New daily persistent headache (NDPH): Secondary | ICD-10-CM

## 2024-01-17 DIAGNOSIS — I639 Cerebral infarction, unspecified: Secondary | ICD-10-CM

## 2024-01-17 NOTE — Progress Notes (Signed)
 4 days of new and persistent HA. No neurological deficits. Pt overdue for CTA (head and neck) and MRI. Has run into scheduling issues with neurology team at [facility]. Will order studies to be done so the can be reviewed by Neuro team and to evaluate HA etiology (do not suspect acute infarct.)

## 2024-01-23 ENCOUNTER — Inpatient Hospital Stay: Admission: RE | Admit: 2024-01-23 | Discharge: 2024-01-23 | Attending: Family Medicine | Admitting: Family Medicine

## 2024-01-23 ENCOUNTER — Inpatient Hospital Stay: Admission: RE | Admit: 2024-01-23

## 2024-01-23 DIAGNOSIS — G4452 New daily persistent headache (NDPH): Secondary | ICD-10-CM

## 2024-01-23 DIAGNOSIS — I639 Cerebral infarction, unspecified: Secondary | ICD-10-CM

## 2024-01-23 MED ORDER — IOPAMIDOL (ISOVUE-370) INJECTION 76%
75.0000 mL | Freq: Once | INTRAVENOUS | Status: AC | PRN
  Administered 2024-01-23: 75 mL via INTRAVENOUS

## 2024-02-08 ENCOUNTER — Ambulatory Visit
Admission: RE | Admit: 2024-02-08 | Discharge: 2024-02-08 | Disposition: A | Discharge status: Home / Self Care | Source: Ambulatory Visit | Attending: Family Medicine | Admitting: Family Medicine

## 2024-02-08 DIAGNOSIS — G4452 New daily persistent headache (NDPH): Secondary | ICD-10-CM

## 2024-02-08 DIAGNOSIS — I639 Cerebral infarction, unspecified: Secondary | ICD-10-CM

## 2024-02-11 LAB — COLOGUARD: COLOGUARD: NEGATIVE

## 2024-03-13 ENCOUNTER — Other Ambulatory Visit: Payer: Self-pay | Admitting: Neurology
